# Patient Record
Sex: Female | Born: 1965
Health system: Southern US, Community
[De-identification: ages and names within clinical notes are randomized; demographics above are authoritative.]

## PROBLEM LIST (undated history)

## (undated) DIAGNOSIS — F419 Anxiety disorder, unspecified: Secondary | ICD-10-CM

## (undated) DIAGNOSIS — D649 Anemia, unspecified: Secondary | ICD-10-CM

## (undated) DIAGNOSIS — E119 Type 2 diabetes mellitus without complications: Secondary | ICD-10-CM

## (undated) DIAGNOSIS — E785 Hyperlipidemia, unspecified: Secondary | ICD-10-CM

## (undated) DIAGNOSIS — I10 Essential (primary) hypertension: Secondary | ICD-10-CM

## (undated) DIAGNOSIS — I82409 Acute embolism and thrombosis of unspecified deep veins of unspecified lower extremity: Secondary | ICD-10-CM

## (undated) DIAGNOSIS — E559 Vitamin D deficiency, unspecified: Secondary | ICD-10-CM

## (undated) HISTORY — DX: Type 2 diabetes mellitus without complications: E11.9

## (undated) HISTORY — DX: Anxiety disorder, unspecified: F41.9

## (undated) HISTORY — DX: Vitamin D deficiency, unspecified: E55.9

## (undated) HISTORY — PX: TUBAL LIGATION: SHX77

## (undated) HISTORY — PX: COLONOSCOPY: SHX174

## (undated) HISTORY — DX: Anemia, unspecified: D64.9

## (undated) HISTORY — DX: Hyperlipidemia, unspecified: E78.5

## (undated) HISTORY — DX: Essential (primary) hypertension: I10

---

## 1997-07-08 ENCOUNTER — Inpatient Hospital Stay (HOSPITAL_COMMUNITY): Admission: AD | Admit: 1997-07-08 | Discharge: 1997-07-08 | Payer: Self-pay | Admitting: *Deleted

## 1999-07-07 ENCOUNTER — Emergency Department (HOSPITAL_COMMUNITY): Admission: EM | Admit: 1999-07-07 | Discharge: 1999-07-07 | Payer: Self-pay | Admitting: Emergency Medicine

## 1999-07-08 ENCOUNTER — Emergency Department (HOSPITAL_COMMUNITY): Admission: EM | Admit: 1999-07-08 | Discharge: 1999-07-08 | Payer: Self-pay | Admitting: Emergency Medicine

## 2000-02-21 ENCOUNTER — Emergency Department (HOSPITAL_COMMUNITY): Admission: EM | Admit: 2000-02-21 | Discharge: 2000-02-21 | Payer: Self-pay | Admitting: Emergency Medicine

## 2000-04-01 ENCOUNTER — Emergency Department (HOSPITAL_COMMUNITY): Admission: EM | Admit: 2000-04-01 | Discharge: 2000-04-01 | Payer: Self-pay | Admitting: Emergency Medicine

## 2002-04-13 ENCOUNTER — Emergency Department (HOSPITAL_COMMUNITY): Admission: EM | Admit: 2002-04-13 | Discharge: 2002-04-14 | Payer: Self-pay | Admitting: Emergency Medicine

## 2004-08-03 ENCOUNTER — Encounter: Admission: RE | Admit: 2004-08-03 | Discharge: 2004-08-03 | Payer: Self-pay | Admitting: Internal Medicine

## 2004-12-30 ENCOUNTER — Emergency Department (HOSPITAL_COMMUNITY): Admission: EM | Admit: 2004-12-30 | Discharge: 2004-12-30 | Payer: Self-pay | Admitting: Emergency Medicine

## 2005-02-13 ENCOUNTER — Emergency Department (HOSPITAL_COMMUNITY): Admission: EM | Admit: 2005-02-13 | Discharge: 2005-02-13 | Payer: Self-pay | Admitting: Emergency Medicine

## 2005-05-11 ENCOUNTER — Emergency Department (HOSPITAL_COMMUNITY): Admission: EM | Admit: 2005-05-11 | Discharge: 2005-05-11 | Payer: Self-pay | Admitting: Emergency Medicine

## 2006-05-03 ENCOUNTER — Emergency Department (HOSPITAL_COMMUNITY): Admission: EM | Admit: 2006-05-03 | Discharge: 2006-05-03 | Payer: Self-pay | Admitting: Family Medicine

## 2006-09-29 ENCOUNTER — Emergency Department (HOSPITAL_COMMUNITY): Admission: EM | Admit: 2006-09-29 | Discharge: 2006-09-29 | Payer: Self-pay | Admitting: Family Medicine

## 2007-03-01 ENCOUNTER — Emergency Department (HOSPITAL_COMMUNITY): Admission: EM | Admit: 2007-03-01 | Discharge: 2007-03-01 | Payer: Self-pay | Admitting: Family Medicine

## 2007-11-05 ENCOUNTER — Emergency Department (HOSPITAL_COMMUNITY): Admission: EM | Admit: 2007-11-05 | Discharge: 2007-11-05 | Payer: Self-pay | Admitting: Family Medicine

## 2008-02-12 ENCOUNTER — Emergency Department (HOSPITAL_COMMUNITY): Admission: EM | Admit: 2008-02-12 | Discharge: 2008-02-12 | Payer: Self-pay | Admitting: Emergency Medicine

## 2008-08-31 ENCOUNTER — Emergency Department (HOSPITAL_COMMUNITY): Admission: EM | Admit: 2008-08-31 | Discharge: 2008-08-31 | Payer: Self-pay | Admitting: Family Medicine

## 2008-12-06 ENCOUNTER — Emergency Department (HOSPITAL_COMMUNITY): Admission: EM | Admit: 2008-12-06 | Discharge: 2008-12-06 | Payer: Self-pay | Admitting: Emergency Medicine

## 2009-02-17 ENCOUNTER — Emergency Department (HOSPITAL_COMMUNITY): Admission: EM | Admit: 2009-02-17 | Discharge: 2009-02-17 | Payer: Self-pay | Admitting: Emergency Medicine

## 2009-08-06 ENCOUNTER — Emergency Department (HOSPITAL_COMMUNITY): Admission: EM | Admit: 2009-08-06 | Discharge: 2009-08-06 | Payer: Self-pay | Admitting: Family Medicine

## 2009-08-08 ENCOUNTER — Emergency Department (HOSPITAL_COMMUNITY): Admission: EM | Admit: 2009-08-08 | Discharge: 2009-08-08 | Payer: Self-pay | Admitting: Family Medicine

## 2009-08-18 ENCOUNTER — Emergency Department (HOSPITAL_COMMUNITY): Admission: EM | Admit: 2009-08-18 | Discharge: 2009-08-18 | Payer: Self-pay | Admitting: Family Medicine

## 2009-10-08 ENCOUNTER — Emergency Department (HOSPITAL_COMMUNITY): Admission: EM | Admit: 2009-10-08 | Discharge: 2009-10-08 | Payer: Self-pay | Admitting: Family Medicine

## 2010-01-18 ENCOUNTER — Emergency Department (HOSPITAL_COMMUNITY): Admission: EM | Admit: 2010-01-18 | Discharge: 2010-01-18 | Payer: Self-pay | Admitting: Family Medicine

## 2010-04-10 ENCOUNTER — Emergency Department (HOSPITAL_COMMUNITY)
Admission: EM | Admit: 2010-04-10 | Discharge: 2010-04-10 | Payer: Self-pay | Source: Home / Self Care | Admitting: Emergency Medicine

## 2010-04-15 ENCOUNTER — Encounter: Payer: Self-pay | Admitting: Internal Medicine

## 2010-06-11 LAB — CULTURE, ROUTINE-ABSCESS

## 2010-07-02 LAB — POCT PREGNANCY, URINE: Preg Test, Ur: NEGATIVE

## 2010-07-02 LAB — POCT URINALYSIS DIP (DEVICE)
Ketones, ur: NEGATIVE mg/dL
Protein, ur: 30 mg/dL — AB
Urobilinogen, UA: 1 mg/dL (ref 0.0–1.0)

## 2010-07-02 LAB — WET PREP, GENITAL: Yeast Wet Prep HPF POC: NONE SEEN

## 2010-10-25 ENCOUNTER — Other Ambulatory Visit: Payer: Self-pay | Admitting: Internal Medicine

## 2010-10-25 DIAGNOSIS — Z1231 Encounter for screening mammogram for malignant neoplasm of breast: Secondary | ICD-10-CM

## 2010-12-05 ENCOUNTER — Ambulatory Visit: Payer: Self-pay

## 2010-12-21 LAB — GC/CHLAMYDIA PROBE AMP, GENITAL
Chlamydia, DNA Probe: NEGATIVE
GC Probe Amp, Genital: NEGATIVE

## 2010-12-21 LAB — WET PREP, GENITAL: Yeast Wet Prep HPF POC: NONE SEEN

## 2010-12-21 LAB — POCT URINALYSIS DIP (DEVICE)
Bilirubin Urine: NEGATIVE
Glucose, UA: NEGATIVE
Ketones, ur: NEGATIVE
Operator id: 29721
Specific Gravity, Urine: 1.025

## 2010-12-21 LAB — POCT PREGNANCY, URINE: Preg Test, Ur: NEGATIVE

## 2011-07-22 ENCOUNTER — Encounter (HOSPITAL_COMMUNITY): Payer: Self-pay | Admitting: Emergency Medicine

## 2011-07-22 ENCOUNTER — Emergency Department (HOSPITAL_COMMUNITY)
Admission: EM | Admit: 2011-07-22 | Discharge: 2011-07-22 | Discharge status: Against Medical Advice | Payer: Medicaid Other | Attending: Emergency Medicine | Admitting: Emergency Medicine

## 2011-07-22 DIAGNOSIS — R109 Unspecified abdominal pain: Secondary | ICD-10-CM | POA: Insufficient documentation

## 2011-07-22 NOTE — ED Notes (Signed)
No answer

## 2011-07-22 NOTE — ED Notes (Signed)
Pt called X2 unable to locate at this time

## 2011-07-22 NOTE — ED Notes (Signed)
abd pain started yesterday pm nausea no vomiting has had diarrhea started her cycle today has been seeing a d/c she states  Denies dysuria

## 2011-09-26 ENCOUNTER — Emergency Department (HOSPITAL_COMMUNITY): Payer: Medicaid Other

## 2011-09-26 ENCOUNTER — Emergency Department (HOSPITAL_COMMUNITY)
Admission: EM | Admit: 2011-09-26 | Discharge: 2011-09-26 | Disposition: A | Discharge status: Home Health | Payer: Medicaid Other | Attending: Emergency Medicine | Admitting: Emergency Medicine

## 2011-09-26 ENCOUNTER — Encounter (HOSPITAL_COMMUNITY): Payer: Self-pay | Admitting: Emergency Medicine

## 2011-09-26 DIAGNOSIS — M79609 Pain in unspecified limb: Secondary | ICD-10-CM | POA: Insufficient documentation

## 2011-09-26 DIAGNOSIS — F17200 Nicotine dependence, unspecified, uncomplicated: Secondary | ICD-10-CM | POA: Insufficient documentation

## 2011-09-26 DIAGNOSIS — M79672 Pain in left foot: Secondary | ICD-10-CM

## 2011-09-26 MED ORDER — IBUPROFEN 400 MG PO TABS
800.0000 mg | ORAL_TABLET | Freq: Once | ORAL | Status: AC
  Administered 2011-09-26: 800 mg via ORAL
  Filled 2011-09-26: qty 2

## 2011-09-26 MED ORDER — NAPROXEN 500 MG PO TABS: 500.0000 mg | ORAL_TABLET | Freq: Two times a day (BID) | ORAL | Status: DC

## 2011-09-26 NOTE — ED Provider Notes (Signed)
History     CSN: [id number]  Arrival date & time 09/26/11  1924   First MD Initiated Contact with Patient 09/26/11 2006      Chief Complaint  Patient presents with  . Foot Pain   HPI  History provided by the patient. Patient is a 46 year old female with no significant past medical history who presents with complaints of intermittent pains in bilateral lower feet. Patient reports having pain symptoms for 1 year or more. Patient states that she believes pains or do to standing and working on her feet. Pain is usually worse after a long day of work. Pain does improve some with rest and elevation at home. Patient states that she has not taken any medications for her pains. Patient states that she has also tried numerous inserts for her shoes and to help with her arch. This is not helped. Patient denies having pain upon awakening her first getting up to walk after rest. She also has some occasional swelling to the feet area pain is described as a burning and tingling sensation. She denies any other aggravating or alleviating factors. She denies any other associated symptoms.    No past medical history on file.  Past Surgical History  Procedure Date  . Cesarean section     No family history on file.  History  Substance Use Topics  . Smoking status: Current Everyday Smoker  . Smokeless tobacco: Not on file  . Alcohol Use: No    OB History    Grav Para Term Preterm Abortions TAB SAB Ect Mult Living                  Review of Systems  Musculoskeletal:       Foot pain  Skin: Negative for rash.       No erythema  Neurological: Positive for numbness. Negative for weakness.    Allergies  Review of patient's allergies indicates no known allergies.  Home Medications  No current outpatient prescriptions on file.  BP 122/82  Pulse 102  Temp 98.5 F (36.9 C) (Oral)  Resp 18  SpO2 99%  LMP 09/22/2011  Physical Exam  Nursing note and vitals reviewed. Constitutional: She is  oriented to person, place, and time. She appears well-developed and well-nourished. No distress.  HENT:  Head: Normocephalic.  Cardiovascular: Normal rate and regular rhythm.   Pulmonary/Chest: Effort normal and breath sounds normal.  Musculoskeletal: Normal range of motion. She exhibits no edema.       Feet appear equal and normal in appearance bilaterally. No appreciable swelling. Normal dorsal pedal pulses. Normal movement in toes and ankles. Normal sensations. There is tenderness over the distal plantar surface of bilateral feet. No gross deformities. No tenderness or pain along the heel.  Neurological: She is alert and oriented to person, place, and time.  Skin: Skin is warm and dry. No rash noted. No erythema.  Psychiatric: She has a normal mood and affect. Her behavior is normal.    ED Course  Procedures    Dg Foot Complete Left  09/26/2011  *RADIOLOGY REPORT*  Clinical Data: Foot pain.  No injury.  LEFT FOOT - COMPLETE 3+ VIEW  Comparison: None  Findings: No acute bony abnormality.  Specifically, no fracture, subluxation, or dislocation.  Soft tissues are intact.  Normal bone mineralization.  Joint spaces are maintained.  IMPRESSION: Normal study.  Original Report Authenticated By: Cyndie Chime, M.D.   Dg Foot Complete Right  09/26/2011  *RADIOLOGY REPORT*  Clinical Data:  Foot pain.  No injury.  RIGHT FOOT COMPLETE - 3+ VIEW  Comparison: None.  Findings: No acute bony abnormality.  Specifically, no fracture, subluxation, or dislocation.  Soft tissues are intact.  Mild degenerative changes in the great toe IP joint with subchondral cyst in the proximal phalanx.  Joint spaces otherwise maintained. Normal bone mineralization.  IMPRESSION: Mild degenerative changes at the great toe IP joint.  Otherwise unremarkable.  Original Report Authenticated By: Cyndie Chime, M.D.     1. Foot pain, bilateral       MDM  8:10 PM patient seen and evaluated. Patient in no acute  distress.        Angus Seller, [place] 09/26/11 2117

## 2011-09-26 NOTE — ED Notes (Signed)
Foot pain, numbness, tingling, burning x 1 year. Feet are not swollen and pt is ambulating with a steady gait.

## 2011-09-27 ENCOUNTER — Encounter (HOSPITAL_COMMUNITY): Payer: Self-pay

## 2011-09-27 ENCOUNTER — Emergency Department (HOSPITAL_COMMUNITY)
Admission: EM | Admit: 2011-09-27 | Discharge: 2011-09-27 | Disposition: A | Discharge status: Home / Self Care | Payer: Medicaid Other | Source: Home / Self Care | Attending: Emergency Medicine | Admitting: Emergency Medicine

## 2011-09-27 DIAGNOSIS — G5750 Tarsal tunnel syndrome, unspecified lower limb: Secondary | ICD-10-CM

## 2011-09-27 NOTE — ED Notes (Signed)
Seen in ED yesterday, did not get her Rx filled for pain in feet for >1 yr since she could not find her Medicaid card; wants to be tested for DM, as she has a family history

## 2011-09-27 NOTE — ED Provider Notes (Signed)
Medical screening examination/treatment/procedure(s) were performed by non-physician practitioner and as supervising physician I was immediately available for consultation/collaboration.   Benny Lennert, MD 09/27/11 [phone]

## 2011-09-27 NOTE — ED Provider Notes (Signed)
History     CSN: [id number]  Arrival date & time 09/27/11  1813   First MD Initiated Contact with Patient 09/27/11 1822      Chief Complaint  Patient presents with  . Foot Pain    (Consider location/radiation/quality/duration/timing/severity/associated sxs/prior treatment) HPI Comments: Patient reports one year of intermittent, bilateral foot pain described as "burning and tingling" located along the medial aspect of her foot and on the bottom of her feet. States that it is worse after standing for about 2 hours, and with walking. Symptoms improved with elevation, rest, wearing flat shoes. No redness, swelling. She denies any pain with heel strike, especially when getting out of bed in the morning, no recent or remote history of trauma to her foot. She states that she has has tried changing her footwear multiple times, sole inserts without improvement. She does note that her feet do not hurt him when she is about 20 pounds lighter. She's not tried any medications for symptoms. She reports no symptoms of hyperglycemia but is concerned about possibility of diabetes due to strong family history. She was seen in the ER yesterday for bilateral foot pain. X-rays were negative for any acute changes. She was thought to have bilateral foot pain, sent home with Naprosyn, which she's not yet filled.  ROS as noted in HPI. All other ROS negative.   Patient is a 46 y.o. female presenting with lower extremity pain. The history is provided by the patient. No language interpreter was used.  Foot Pain This is a chronic problem. The current episode started more than 1 week ago. The problem occurs constantly. The problem has not changed since onset.The symptoms are aggravated by walking. The symptoms are relieved by lying down and rest. She has tried a warm compress and a cold compress for the symptoms. The treatment provided no relief.    History reviewed. No pertinent past medical history.  Past Surgical  History  Procedure Date  . Cesarean section     Family History  Problem Relation Age of Onset  . Diabetes Neg Hx     History  Substance Use Topics  . Smoking status: Current Everyday Smoker  . Smokeless tobacco: Not on file  . Alcohol Use: No    OB History    Grav Para Term Preterm Abortions TAB SAB Ect Mult Living                  Review of Systems  Allergies  Review of patient's allergies indicates no known allergies.  Home Medications   Current Outpatient Rx  Name Route Sig Dispense Refill  . NAPROXEN 500 MG PO TABS Oral Take 1 tablet (500 mg total) by mouth 2 (two) times daily. 30 tablet 0    BP 131/88  Pulse 103  Temp 98.6 F (37 C) (Oral)  Resp 18  SpO2 100%  LMP 09/17/2011  Physical Exam  Nursing note and vitals reviewed. Constitutional: She is oriented to person, place, and time. She appears well-developed and well-nourished. No distress.  HENT:  Head: Normocephalic and atraumatic.  Eyes: Conjunctivae and EOM are normal.  Neck: Normal range of motion.  Cardiovascular: Normal rate.   Pulmonary/Chest: Effort normal.  Abdominal: She exhibits no distension.  Musculoskeletal: Normal range of motion.       Feet:       Patient reports pain in the area of drawing. Has tenderness along right plantar fascia. DP 2+. She is able to wiggle all toes actively. Sensation to temperature,  light touch and 2-point discrimination intact bilaterally. No tenderness over the midfoot, fifth metatarsal, tarsals bilaterally. Skin intact. ankle exam normal.  Neurological: She is alert and oriented to person, place, and time. Coordination normal.  Skin: Skin is warm and dry.  Psychiatric: She has a normal mood and affect. Her behavior is normal. Judgment and thought content normal.    ED Course  Procedures (including critical care time)  Labs Reviewed  GLUCOSE, CAPILLARY - Abnormal; Notable for the following:    Glucose-Capillary 100 (*)     All other components within  normal limits   Dg Foot Complete Left  09/26/2011  *RADIOLOGY REPORT*  Clinical Data: Foot pain.  No injury.  LEFT FOOT - COMPLETE 3+ VIEW  Comparison: None  Findings: No acute bony abnormality.  Specifically, no fracture, subluxation, or dislocation.  Soft tissues are intact.  Normal bone mineralization.  Joint spaces are maintained.  IMPRESSION: Normal study.  Original Report Authenticated By: Cyndie Chime, M.D.   Dg Foot Complete Right  09/26/2011  *RADIOLOGY REPORT*  Clinical Data: Foot pain.  No injury.  RIGHT FOOT COMPLETE - 3+ VIEW  Comparison: None.  Findings: No acute bony abnormality.  Specifically, no fracture, subluxation, or dislocation.  Soft tissues are intact.  Mild degenerative changes in the great toe IP joint with subchondral cyst in the proximal phalanx.  Joint spaces otherwise maintained. Normal bone mineralization.  IMPRESSION: Mild degenerative changes at the great toe IP joint.  Otherwise unremarkable.  Original Report Authenticated By: Cyndie Chime, M.D.     1. Tarsal tunnel syndrome     MDM  Previous records reviewed. As noted in history of present illness.  [place] narcotic database reviewed. Pt with no narcotic rx in past 6 months.  H&P most suggestive of tarsal tunnel syndrome, rather than plantar fasciitis. Glucose is normal. Will have patient fill the Naprosyn, take it for 2 weeks daily. Advised continued ice, rest, stretching. Provided her with some rehabilitation exercises. We'll refer her to [facility], or [facility] for definitive management.  Luiz Blare, MD 09/27/11 2027

## 2012-01-09 ENCOUNTER — Emergency Department (HOSPITAL_COMMUNITY)
Admission: EM | Admit: 2012-01-09 | Discharge: 2012-01-09 | Disposition: A | Discharge status: Home / Self Care | Payer: Self-pay | Attending: Emergency Medicine | Admitting: Emergency Medicine

## 2012-01-09 ENCOUNTER — Encounter (HOSPITAL_COMMUNITY): Payer: Self-pay | Admitting: Family Medicine

## 2012-01-09 DIAGNOSIS — K029 Dental caries, unspecified: Secondary | ICD-10-CM | POA: Insufficient documentation

## 2012-01-09 DIAGNOSIS — F17200 Nicotine dependence, unspecified, uncomplicated: Secondary | ICD-10-CM | POA: Insufficient documentation

## 2012-01-09 DIAGNOSIS — K0889 Other specified disorders of teeth and supporting structures: Secondary | ICD-10-CM

## 2012-01-09 MED ORDER — OXYCODONE-ACETAMINOPHEN 5-325 MG PO TABS: 2.0000 | ORAL_TABLET | ORAL | Status: DC | PRN

## 2012-01-09 MED ORDER — AMOXICILLIN 500 MG PO CAPS
500.0000 mg | ORAL_CAPSULE | Freq: Once | ORAL | Status: AC
  Administered 2012-01-09: 500 mg via ORAL
  Filled 2012-01-09: qty 1

## 2012-01-09 MED ORDER — AMOXICILLIN 500 MG PO CAPS: 500.0000 mg | ORAL_CAPSULE | Freq: Three times a day (TID) | ORAL | Status: DC

## 2012-01-09 NOTE — ED Notes (Signed)
Pt complaining of right upper dental  pain

## 2012-01-09 NOTE — ED Provider Notes (Signed)
History   This chart was scribed for Amanda Canal, MD by Charolett Bumpers . The patient was seen in room TR08C/TR08C. Patient's care was started at 1639.   CSN: [id number] Arrival date & time 01/09/12  1611  First MD Initiated Contact with Patient 01/09/12 1639      Chief Complaint  Patient presents with  . Dental Pain   The history is provided by the patient. No language interpreter was used.  Amanda Tran is a 46 y.o. female who presents to the Emergency Department complaining of intermittent, moderate right lower dental pain for the past week. She denies taking anything for pain. She denies any fevers. She denies any other associated symptoms. No modifying factors. No other complaints at this time. She is not followed by a dentist.    History reviewed. No pertinent past medical history.  Past Surgical History  Procedure Date  . Cesarean section     Family History  Problem Relation Age of Onset  . Diabetes Neg Hx     History  Substance Use Topics  . Smoking status: Current Every Day Smoker  . Smokeless tobacco: Not on file  . Alcohol Use: No    OB History    Grav Para Term Preterm Abortions TAB SAB Ect Mult Living                  Review of Systems  Constitutional: Negative for fever and chills.  HENT: Positive for dental problem.   Respiratory: Negative for shortness of breath.   Gastrointestinal: Negative for nausea and vomiting.  Neurological: Negative for weakness.  All other systems reviewed and are negative.    Allergies  Review of patient's allergies indicates no known allergies.  Home Medications  No current outpatient prescriptions on file.  BP 119/80  Pulse 92  Temp 98 F (36.7 C)  Resp 18  SpO2 100%  Physical Exam  Nursing note and vitals reviewed. Constitutional: She is oriented to person, place, and time. She appears well-developed and well-nourished. No distress.       NAD  HENT:  Head: Normocephalic and atraumatic.       Caries on the right lower tooth, gingival appears normal.   Eyes: EOM are normal.  Neck: Neck supple. No tracheal deviation present.  Cardiovascular: Normal rate.   Pulmonary/Chest: Effort normal. No respiratory distress.  Musculoskeletal: Normal range of motion.  Neurological: She is alert and oriented to person, place, and time.  Skin: Skin is warm and dry.  Psychiatric: She has a normal mood and affect. Her behavior is normal.    ED Course  Procedures (including critical care time)  DIAGNOSTIC STUDIES: Oxygen Saturation is 100% on room air, normal by my interpretation.    COORDINATION OF CARE:  16:54-Discussed planned course of treatment with the patient including pain medication and f/u with dentist on call, who is agreeable at this time.   No diagnosis found.    MDM  Amanda Tran is a 46 y.o. female here with dental pain, likely from dental caries. She is prescribed amoxicillin and percocet. She will f/u with dentist on call.    This document was completed by the scribe at my direction and I have reviewed its accuracy. I have personally examined the patient and agrees with the above document.   Chaney Malling, MD      Amanda Canal, MD 01/09/12 [phone]

## 2012-03-11 ENCOUNTER — Encounter (HOSPITAL_COMMUNITY): Payer: Self-pay | Admitting: *Deleted

## 2012-03-11 ENCOUNTER — Emergency Department (HOSPITAL_COMMUNITY)
Admission: EM | Admit: 2012-03-11 | Discharge: 2012-03-11 | Disposition: A | Discharge status: Home / Self Care | Payer: Self-pay | Attending: Emergency Medicine | Admitting: Emergency Medicine

## 2012-03-11 DIAGNOSIS — M79609 Pain in unspecified limb: Secondary | ICD-10-CM

## 2012-03-11 DIAGNOSIS — F17200 Nicotine dependence, unspecified, uncomplicated: Secondary | ICD-10-CM | POA: Insufficient documentation

## 2012-03-11 DIAGNOSIS — IMO0001 Reserved for inherently not codable concepts without codable children: Secondary | ICD-10-CM | POA: Insufficient documentation

## 2012-03-11 DIAGNOSIS — I82409 Acute embolism and thrombosis of unspecified deep veins of unspecified lower extremity: Secondary | ICD-10-CM

## 2012-03-11 HISTORY — DX: Acute embolism and thrombosis of unspecified deep veins of unspecified lower extremity: I82.409

## 2012-03-11 LAB — CBC WITH DIFFERENTIAL/PLATELET
Basophils Absolute: 0 10*3/uL (ref 0.0–0.1)
Basophils Relative: 0 % (ref 0–1)
Eosinophils Relative: 1 % (ref 0–5)
HCT: 35.2 % — ABNORMAL LOW (ref 36.0–46.0)
Lymphocytes Relative: 31 % (ref 12–46)
MCHC: 31.5 g/dL (ref 30.0–36.0)
MCV: 75.4 fL — ABNORMAL LOW (ref 78.0–100.0)
Monocytes Absolute: 0.6 10*3/uL (ref 0.1–1.0)
RDW: 17.1 % — ABNORMAL HIGH (ref 11.5–15.5)

## 2012-03-11 LAB — BASIC METABOLIC PANEL
BUN: 9 mg/dL (ref 6–23)
CO2: 21 mEq/L (ref 19–32)
Calcium: 9 mg/dL (ref 8.4–10.5)
Creatinine, Ser: 0.73 mg/dL (ref 0.50–1.10)

## 2012-03-11 MED ORDER — RIVAROXABAN 15 MG PO TABS
15.0000 mg | ORAL_TABLET | Freq: Every day | ORAL | Status: DC
  Administered 2012-03-11: 15 mg via ORAL
  Filled 2012-03-11: qty 1

## 2012-03-11 MED ORDER — HYDROCODONE-ACETAMINOPHEN 5-325 MG PO TABS: 1.0000 | ORAL_TABLET | ORAL | Status: DC | PRN

## 2012-03-11 MED ORDER — IBUPROFEN 800 MG PO TABS
800.0000 mg | ORAL_TABLET | Freq: Once | ORAL | Status: AC
  Administered 2012-03-11: 800 mg via ORAL
  Filled 2012-03-11: qty 1

## 2012-03-11 MED ORDER — RIVAROXABAN 15 MG PO TABS: 15.0000 mg | ORAL_TABLET | Freq: Two times a day (BID) | ORAL | Status: DC

## 2012-03-11 MED ORDER — TRAMADOL HCL 50 MG PO TABS: 50.0000 mg | ORAL_TABLET | Freq: Four times a day (QID) | ORAL | Status: DC | PRN

## 2012-03-11 MED ORDER — SODIUM CHLORIDE 0.9 % IV SOLN
Freq: Once | INTRAVENOUS | Status: AC
  Administered 2012-03-11: 13:00:00 via INTRAVENOUS

## 2012-03-11 MED ORDER — HYDROCODONE-ACETAMINOPHEN 5-325 MG PO TABS
1.0000 | ORAL_TABLET | Freq: Once | ORAL | Status: AC
  Administered 2012-03-11: 1 via ORAL
  Filled 2012-03-11: qty 1

## 2012-03-11 NOTE — ED Provider Notes (Signed)
Patient tells me prior to leaving the emergency department, but she itches when she takes hydrocodone and requests a different pain medicine. I will give her tramadol.  Roxy Horseman, PA-C 03/11/12 1540

## 2012-03-11 NOTE — ED Notes (Signed)
Pt states that she noticed yesterday that she was having severe L thigh pain.  It is unrelieved with heating pad or rest.  Pedal pulse present.  Denies any CP or SOB.

## 2012-03-11 NOTE — Progress Notes (Signed)
*  Preliminary Results* Left lower extremity venous duplex completed. Left lower extremity is positive for deep vein thrombosis involving the left distal femoral vein, popliteal, and proximal posterior tibial and peroneal veins. No evidence of left Baker's cyst. Preliminary results discussed with Melvenia Beam, PA.  03/11/2012 12:25 PM Gertie Fey, RDMS, RDCS

## 2012-03-11 NOTE — ED Provider Notes (Signed)
History     CSN: [id number]  Arrival date & time 03/11/12  1308   First MD Initiated Contact with Patient 03/11/12 1007      Chief Complaint  Patient presents with  . Leg Pain    (Consider location/radiation/quality/duration/timing/severity/associated sxs/prior treatment) Patient is a 46 y.o. female presenting with leg pain. The history is provided by the patient.  Leg Pain  The incident occurred 2 days ago. There was no injury mechanism. The pain is present in the left thigh. The quality of the pain is described as aching. The pain is moderate. The pain has been constant since onset. Pertinent negatives include no numbness, no loss of motion, no loss of sensation and no tingling. Associated symptoms comments: Lower left extremity pain most predominant in the medial thigh. No injury, back pain or similar pain in the past. She denies swelling, redness or fever. The pain is constant but worse with movement and extends to the hip and distally into calf..    No past medical history on file.  Past Surgical History  Procedure Date  . Cesarean section     Family History  Problem Relation Age of Onset  . Diabetes Neg Hx     History  Substance Use Topics  . Smoking status: Current Every Day Smoker  . Smokeless tobacco: Never Used  . Alcohol Use: No    OB History    Grav Para Term Preterm Abortions TAB SAB Ect Mult Living                  Review of Systems  Constitutional: Negative for fever and chills.  HENT: Negative.   Respiratory: Negative.   Cardiovascular: Negative.   Gastrointestinal: Negative.   Genitourinary: Negative for dysuria.  Musculoskeletal: Positive for myalgias.       See HPI.  Skin: Negative.   Neurological: Negative.  Negative for tingling and numbness.    Allergies  Review of patient's allergies indicates no known allergies.  Home Medications  No current outpatient prescriptions on file.  BP 96/63  Pulse 76  Temp 98.4 F (36.9 C) (Oral)   Resp 18  SpO2 100%  LMP 02/26/2012  Physical Exam  Constitutional: She is oriented to person, place, and time. She appears well-developed and well-nourished.  Neck: Normal range of motion.  Cardiovascular:       Pulses in distal LE's present and equal.   Pulmonary/Chest: Effort normal.  Musculoskeletal: Normal range of motion. She exhibits no edema.       Left leg unremarkable in appearance, without swelling or discoloration. No warmth to light touch. Tender to medial thigh without mass or palpable abnormality. Calf and hip are nontender.   Neurological: She is alert and oriented to person, place, and time.  Skin: Skin is warm and dry.    ED Course  Procedures (including critical care time)   Labs Reviewed  CBC WITH DIFFERENTIAL  BASIC METABOLIC PANEL   Results for orders placed during the hospital encounter of 03/11/12  CBC WITH DIFFERENTIAL      Component Value Range   WBC 6.8  4.0 - 10.5 K/uL   RBC 4.67  3.87 - 5.11 MIL/uL   Hemoglobin 11.1 (*) 12.0 - 15.0 g/dL   HCT 65.7 (*) 84.6 - 96.2 %   MCV 75.4 (*) 78.0 - 100.0 fL   MCH 23.8 (*) 26.0 - 34.0 pg   MCHC 31.5  30.0 - 36.0 g/dL   RDW 95.2 (*) 84.1 - 32.4 %  Platelets 244  150 - 400 K/uL   Neutrophils Relative 59  43 - 77 %   Neutro Abs 4.0  1.7 - 7.7 K/uL   Lymphocytes Relative 31  12 - 46 %   Lymphs Abs 2.1  0.7 - 4.0 K/uL   Monocytes Relative 8  3 - 12 %   Monocytes Absolute 0.6  0.1 - 1.0 K/uL   Eosinophils Relative 1  0 - 5 %   Eosinophils Absolute 0.1  0.0 - 0.7 K/uL   Basophils Relative 0  0 - 1 %   Basophils Absolute 0.0  0.0 - 0.1 K/uL  BASIC METABOLIC PANEL      Component Value Range   Sodium 137  135 - 145 mEq/L   Potassium 3.6  3.5 - 5.1 mEq/L   Chloride 104  96 - 112 mEq/L   CO2 21  19 - 32 mEq/L   Glucose, Bld 82  70 - 99 mg/dL   BUN 9  6 - 23 mg/dL   Creatinine, Ser 1.61  0.50 - 1.10 mg/dL   Calcium 9.0  8.4 - 09.6 mg/dL   GFR calc non Af Amer >90  >90 mL/min   GFR calc Af Amer >90  >90  mL/min    No results found.   No diagnosis found.  1. DVT   MDM  Positive for DVT on doppler - distal femoral to proximal PTV and peroneal. Discussed outpatient follow up with the patient and will need to have assistance. [employer] Coordinator involved. Xarelto ordered.  [facility] 2 days. Xarelto explained, all questions answer. Normal blood studies. She is stable for discharge.   Rodena Medin, PA-C 03/11/12 [phone]

## 2012-03-12 NOTE — ED Provider Notes (Signed)
Medical screening examination/treatment/procedure(s) were performed by non-physician practitioner and as supervising physician I was immediately available for consultation/collaboration.  Flint Melter, MD 03/12/12 [phone]

## 2012-03-18 ENCOUNTER — Encounter (HOSPITAL_COMMUNITY): Payer: Self-pay | Admitting: Emergency Medicine

## 2012-03-18 ENCOUNTER — Observation Stay (HOSPITAL_COMMUNITY)
Admission: EM | Admit: 2012-03-18 | Discharge: 2012-03-19 | Disposition: A | Discharge status: Home / Self Care | Payer: Medicaid Other | Attending: Internal Medicine | Admitting: Internal Medicine

## 2012-03-18 DIAGNOSIS — R42 Dizziness and giddiness: Secondary | ICD-10-CM | POA: Insufficient documentation

## 2012-03-18 DIAGNOSIS — N926 Irregular menstruation, unspecified: Secondary | ICD-10-CM

## 2012-03-18 DIAGNOSIS — N939 Abnormal uterine and vaginal bleeding, unspecified: Secondary | ICD-10-CM

## 2012-03-18 DIAGNOSIS — F17200 Nicotine dependence, unspecified, uncomplicated: Secondary | ICD-10-CM

## 2012-03-18 DIAGNOSIS — D649 Anemia, unspecified: Secondary | ICD-10-CM

## 2012-03-18 DIAGNOSIS — R5381 Other malaise: Secondary | ICD-10-CM | POA: Insufficient documentation

## 2012-03-18 DIAGNOSIS — N920 Excessive and frequent menstruation with regular cycle: Secondary | ICD-10-CM | POA: Insufficient documentation

## 2012-03-18 DIAGNOSIS — I82402 Acute embolism and thrombosis of unspecified deep veins of left lower extremity: Secondary | ICD-10-CM | POA: Diagnosis present

## 2012-03-18 DIAGNOSIS — D509 Iron deficiency anemia, unspecified: Secondary | ICD-10-CM | POA: Diagnosis present

## 2012-03-18 DIAGNOSIS — Z7901 Long term (current) use of anticoagulants: Secondary | ICD-10-CM | POA: Insufficient documentation

## 2012-03-18 DIAGNOSIS — D62 Acute posthemorrhagic anemia: Principal | ICD-10-CM | POA: Insufficient documentation

## 2012-03-18 DIAGNOSIS — R51 Headache: Secondary | ICD-10-CM | POA: Insufficient documentation

## 2012-03-18 DIAGNOSIS — A599 Trichomoniasis, unspecified: Secondary | ICD-10-CM | POA: Insufficient documentation

## 2012-03-18 DIAGNOSIS — I82409 Acute embolism and thrombosis of unspecified deep veins of unspecified lower extremity: Secondary | ICD-10-CM | POA: Insufficient documentation

## 2012-03-18 HISTORY — DX: Acute embolism and thrombosis of unspecified deep veins of unspecified lower extremity: I82.409

## 2012-03-18 LAB — WET PREP, GENITAL
WBC, Wet Prep HPF POC: NONE SEEN
Yeast Wet Prep HPF POC: NONE SEEN

## 2012-03-18 LAB — CBC
HCT: 29.4 % — ABNORMAL LOW (ref 36.0–46.0)
MCHC: 32.3 g/dL (ref 30.0–36.0)
Platelets: 252 10*3/uL (ref 150–400)
RDW: 16.8 % — ABNORMAL HIGH (ref 11.5–15.5)
WBC: 5.5 10*3/uL (ref 4.0–10.5)

## 2012-03-18 LAB — RETICULOCYTES
RBC.: 3.97 MIL/uL (ref 3.87–5.11)
Retic Count, Absolute: 27.8 10*3/uL (ref 19.0–186.0)

## 2012-03-18 LAB — CBC WITH DIFFERENTIAL/PLATELET
Basophils Absolute: 0 10*3/uL (ref 0.0–0.1)
Eosinophils Relative: 3 % (ref 0–5)
HCT: 33 % — ABNORMAL LOW (ref 36.0–46.0)
Lymphocytes Relative: 36 % (ref 12–46)
Lymphs Abs: 1.7 10*3/uL (ref 0.7–4.0)
MCV: 75 fL — ABNORMAL LOW (ref 78.0–100.0)
Monocytes Absolute: 0.3 10*3/uL (ref 0.1–1.0)
Neutro Abs: 2.7 10*3/uL (ref 1.7–7.7)
Platelets: 277 10*3/uL (ref 150–400)
RBC: 4.4 MIL/uL (ref 3.87–5.11)
RDW: 16.9 % — ABNORMAL HIGH (ref 11.5–15.5)
WBC: 4.8 10*3/uL (ref 4.0–10.5)

## 2012-03-18 LAB — BASIC METABOLIC PANEL
CO2: 24 mEq/L (ref 19–32)
Calcium: 9.1 mg/dL (ref 8.4–10.5)
Chloride: 103 mEq/L (ref 96–112)
Glucose, Bld: 100 mg/dL — ABNORMAL HIGH (ref 70–99)
Sodium: 137 mEq/L (ref 135–145)

## 2012-03-18 MED ORDER — MORPHINE SULFATE 4 MG/ML IJ SOLN
4.0000 mg | Freq: Once | INTRAMUSCULAR | Status: AC
  Administered 2012-03-18: 4 mg via INTRAVENOUS
  Filled 2012-03-18: qty 1

## 2012-03-18 MED ORDER — ACETAMINOPHEN 650 MG RE SUPP: 650.0000 mg | Freq: Four times a day (QID) | RECTAL | Status: DC | PRN

## 2012-03-18 MED ORDER — RIVAROXABAN 15 MG PO TABS
15.0000 mg | ORAL_TABLET | Freq: Two times a day (BID) | ORAL | Status: DC
  Administered 2012-03-18 – 2012-03-19 (×2): 15 mg via ORAL
  Filled 2012-03-18 (×5): qty 1

## 2012-03-18 MED ORDER — ONDANSETRON HCL 4 MG PO TABS: 4.0000 mg | ORAL_TABLET | Freq: Four times a day (QID) | ORAL | Status: DC | PRN

## 2012-03-18 MED ORDER — SODIUM CHLORIDE 0.9 % IV BOLUS (SEPSIS)
1000.0000 mL | Freq: Once | INTRAVENOUS | Status: AC
  Administered 2012-03-18: 1000 mL via INTRAVENOUS

## 2012-03-18 MED ORDER — ACETAMINOPHEN 325 MG PO TABS
650.0000 mg | ORAL_TABLET | Freq: Four times a day (QID) | ORAL | Status: DC | PRN
  Administered 2012-03-19: 650 mg via ORAL
  Filled 2012-03-18 (×2): qty 2

## 2012-03-18 MED ORDER — METRONIDAZOLE 500 MG PO TABS
2000.0000 mg | ORAL_TABLET | Freq: Once | ORAL | Status: AC
  Administered 2012-03-18: 2000 mg via ORAL
  Filled 2012-03-18: qty 4

## 2012-03-18 MED ORDER — DIPHENHYDRAMINE HCL 25 MG PO CAPS
25.0000 mg | ORAL_CAPSULE | ORAL | Status: DC | PRN
  Administered 2012-03-18: 50 mg via ORAL
  Filled 2012-03-18: qty 2

## 2012-03-18 MED ORDER — SODIUM CHLORIDE 0.9 % IV SOLN: 250.0000 mL | INTRAVENOUS | Status: DC | PRN

## 2012-03-18 MED ORDER — NICOTINE 21 MG/24HR TD PT24
21.0000 mg | MEDICATED_PATCH | Freq: Every day | TRANSDERMAL | Status: DC
  Administered 2012-03-18 – 2012-03-19 (×2): 21 mg via TRANSDERMAL
  Filled 2012-03-18 (×2): qty 1

## 2012-03-18 MED ORDER — DIPHENHYDRAMINE HCL 25 MG PO CAPS: 50.0000 mg | ORAL_CAPSULE | Freq: Four times a day (QID) | ORAL | Status: DC | PRN

## 2012-03-18 MED ORDER — SODIUM CHLORIDE 0.9 % IJ SOLN
3.0000 mL | Freq: Two times a day (BID) | INTRAMUSCULAR | Status: DC
  Administered 2012-03-18: 3 mL via INTRAVENOUS

## 2012-03-18 MED ORDER — SODIUM CHLORIDE 0.9 % IJ SOLN: 3.0000 mL | INTRAMUSCULAR | Status: DC | PRN

## 2012-03-18 MED ORDER — ONDANSETRON HCL 4 MG/2ML IJ SOLN: 4.0000 mg | Freq: Four times a day (QID) | INTRAMUSCULAR | Status: DC | PRN

## 2012-03-18 NOTE — ED Notes (Signed)
Vag bleeding heavy since Monday am  Started to have clots yesterday  , started on meds for DVT  On  12/ 18 no cramping but her head hurts bad  And she is lightheaded

## 2012-03-18 NOTE — ED Provider Notes (Signed)
History     CSN: [id number]  Arrival date & time 03/18/12  1014   First MD Initiated Contact with Patient 03/18/12 1033      No chief complaint on file.   (Consider location/radiation/quality/duration/timing/severity/associated sxs/prior treatment) HPI  46 year old female with hx of DVT currently on Xarelto presents with vaginal bleeding.  Pt report she was diagnosed with DVT and has been started on Xarelto since Dec 18th.  She just started her usual menstrual period yesterday but sts she is having heavy menstruation, having to change pads hourly and having clots.  Onset is gradual, persistent, moderate in severity, nothing makes it better or worse.  She has headache, lightheadedness and feeling weak.  Otherwise denies fever, chills, cp, sob, abd pain, n/v/d, dysuria or rash.   Past Medical History  Diagnosis Date  . DVT (deep venous thrombosis)     Past Surgical History  Procedure Date  . Cesarean section     Family History  Problem Relation Age of Onset  . Diabetes Neg Hx     History  Substance Use Topics  . Smoking status: Current Every Day Smoker  . Smokeless tobacco: Never Used  . Alcohol Use: No    OB History    Grav Para Term Preterm Abortions TAB SAB Ect Mult Living                  Review of Systems  All other systems reviewed and are negative.    Allergies  Review of patient's allergies indicates no known allergies.  Home Medications   Current Outpatient Rx  Name  Route  Sig  Dispense  Refill  . ACETAMINOPHEN 325 MG PO TABS   Oral   Take 650 mg by mouth every 6 (six) hours as needed. For pain         . RIVAROXABAN 15 MG PO TABS   Oral   Take 1 tablet (15 mg total) by mouth 2 (two) times daily.   42 tablet   0     BP 124/82  Pulse 88  Temp 98.2 F (36.8 C)  SpO2 100%  LMP 02/26/2012  Physical Exam  Nursing note and vitals reviewed. Constitutional: She appears well-developed and well-nourished. No distress.  HENT:  Head:  Normocephalic and atraumatic.  Eyes: Conjunctivae normal are normal.  Neck: Normal range of motion. Neck supple.  Cardiovascular: Normal rate and regular rhythm.   Pulmonary/Chest: Effort normal and breath sounds normal. She exhibits no tenderness.  Abdominal: Soft. There is no tenderness.  Genitourinary: Uterus normal. There is no rash or lesion on the right labia. There is no rash or lesion on the left labia. Cervix exhibits no motion tenderness and no discharge. Right adnexum displays no mass and no tenderness. Left adnexum displays no mass and no tenderness. There is bleeding around the vagina. No erythema or tenderness around the vagina. No vaginal discharge found.       Chaperone present:  Moderate amount of blood in vaginal vault.  No significant pain on bimanual exam.  Cervical os closed.    Lymphadenopathy:       Right: No inguinal adenopathy present.       Left: No inguinal adenopathy present.    ED Course  Procedures (including critical care time)  Results for orders placed during the hospital encounter of 03/18/12  CBC WITH DIFFERENTIAL      Component Value Range   WBC 4.8  4.0 - 10.5 K/uL   RBC 4.40  3.87 - 5.11 MIL/uL   Hemoglobin 10.7 (*) 12.0 - 15.0 g/dL   HCT 45.4 (*) 09.8 - 11.9 %   MCV 75.0 (*) 78.0 - 100.0 fL   MCH 24.3 (*) 26.0 - 34.0 pg   MCHC 32.4  30.0 - 36.0 g/dL   RDW 14.7 (*) 82.9 - 56.2 %   Platelets 277  150 - 400 K/uL   Neutrophils Relative 55  43 - 77 %   Neutro Abs 2.7  1.7 - 7.7 K/uL   Lymphocytes Relative 36  12 - 46 %   Lymphs Abs 1.7  0.7 - 4.0 K/uL   Monocytes Relative 5  3 - 12 %   Monocytes Absolute 0.3  0.1 - 1.0 K/uL   Eosinophils Relative 3  0 - 5 %   Eosinophils Absolute 0.1  0.0 - 0.7 K/uL   Basophils Relative 1  0 - 1 %   Basophils Absolute 0.0  0.0 - 0.1 K/uL  BASIC METABOLIC PANEL      Component Value Range   Sodium 137  135 - 145 mEq/L   Potassium 3.6  3.5 - 5.1 mEq/L   Chloride 103  96 - 112 mEq/L   CO2 24  19 - 32 mEq/L    Glucose, Bld 100 (*) 70 - 99 mg/dL   BUN 8  6 - 23 mg/dL   Creatinine, Ser 1.30  0.50 - 1.10 mg/dL   Calcium 9.1  8.4 - 86.5 mg/dL   GFR calc non Af Amer >90  >90 mL/min   GFR calc Af Amer >90  >90 mL/min  WET PREP, GENITAL      Component Value Range   Yeast Wet Prep HPF POC NONE SEEN  NONE SEEN   Trich, Wet Prep FEW (*) NONE SEEN   Clue Cells Wet Prep HPF POC NONE SEEN  NONE SEEN   WBC, Wet Prep HPF POC NONE SEEN  NONE SEEN  TYPE AND SCREEN      Component Value Range   ABO/RH(D) O POS     Antibody Screen NEG     Sample Expiration 03/21/2012     No results found.   1. Heavy menstruation, on Xarelto 2. trichomonas  MDM  Pt with heavy menstruation currently on Xarelto.  She's on her second day of her usual 5 day menstruating.  She reports persistent bleeding, affecting her ADL.  Will obtain labs, pelvic exam, and will check orthostatic VS.  Care discussed with my attending.    1:08 PM Pelvic exam with moderate blood in vaginal vault but otherwise unremarkable.  Pt became tachycardic when she was evaluated for orthostatic hypotension, and is symptomatic.  Hgb is 10.7, was 11.1 last week.  There is some trichomonas on wet prep, will treat.  Consider admission for observation with serial hemoglobin or possibly reverse Xarelto if indicated.  Pt voice understanding and agrees with plan.    2:19 PM I have consulted with [facility], Dr Cena Benton, who agrees to see pt in ER and will admit pt for observation.    BP 134/88  Pulse 93  Temp 98.2 F (36.8 C)  SpO2 100%  LMP 02/26/2012  I have reviewed nursing notes and vital signs. I personally reviewed the imaging tests through PACS system  I reviewed available ER/hospitalization records thought the EMR      Fayrene Helper, PA-C 03/18/12 1420

## 2012-03-18 NOTE — H&P (Signed)
[facility] History and Physical  [name]:[id number] DOB: 05/14/1965 DOA: 03/18/2012  Referring physician: Jeanene Erb by Fayrene Helper, ED PA PCP: Dorrene German, MD  Specialists: I have discussed and consulted OBGYN  Chief Complaint: heavy menstrual cycles worse on xarelto  HPI: Amanda Tran is a 46 y.o. female  Patient is G5P5 african american patient that reports that she was recently diagnosed with DVT earlier this month and placed on xarelto.  She reports that prior to her being on xarelto she has history of heavy menstrual periods.  Reports that she has been having heavier menstrual periods since being on xarelto and as such given her increased in frequency of changing pads and clots she decided to come to the ED for further evaluation and recommendations.  The problem started gradually and has been persistent.  Nothing patient is aware of makes it better or worse.  She denies any recent sexual intercourse or vaginal discharge.  Denies any abdominal discomfort and reports smoking 1ppd.   In ED patient had hemoglobin of 10.7 decreased from 11.1 on 12/18.  We were consulted for admission 2ary to heavy menstrual cycle and possibility of worsening anemia.  Patient reports that her family is [place] witness and should she need blood transfusion she would like to have a few questions answered.  Review of Systems: The patient denies anorexia, fever, weight loss,, vision loss, decreased hearing, hoarseness, chest pain, syncope, dyspnea on exertion, peripheral edema, balance deficits, hemoptysis, abdominal pain, melena, hematochezia, severe indigestion/heartburn, hematuria, incontinence, genital sores, muscle weakness, suspicious skin lesions, transient blindness, difficulty walking, depression, unusual weight change, + abnormal bleeding, enlarged lymph nodes, angioedema, and breast masses.    Past Medical History  Diagnosis Date  . DVT (deep venous thrombosis)     Past Surgical History  Procedure Date  . Cesarean section    Social History:  reports that she has been smoking.  She has never used smokeless tobacco. She reports that she does not drink alcohol or use illicit drugs. Lives at home  Can patient participate in ADLs? Yes Reportedly smokes 1ppd  No Known Allergies  Family History  Problem Relation Age of Onset  . Diabetes Neg Hx    None other reported when asked directly   Prior to Admission medications   Medication Sig Start Date End Date Taking? Authorizing Provider  acetaminophen (TYLENOL) 325 MG tablet Take 650 mg by mouth every 6 (six) hours as needed. For pain   Yes Historical Provider, MD  Rivaroxaban (XARELTO) 15 MG TABS tablet Take 1 tablet (15 mg total) by mouth 2 (two) times daily. 03/11/12  Yes Rodena Medin, PA-C   Physical Exam: Filed Vitals:   03/18/12 1032 03/18/12 1215 03/18/12 1216  BP: 124/82 131/78 134/88  Pulse: 88 74 93  Temp: 98.2 F (36.8 C)    SpO2: 100%       General:  Pt in NAD, Alert and Oriented  Eyes: EOMI, non icteric  ENT: normal exterior and appearance   Neck: supple no goiter  Cardiovascular: RRR, No MRG  Respiratory: CTA BL, no wheezes  Abdomen: soft, NT, ND  Skin: warm and dry  Musculoskeletal: no cyanosis or clubbing  Psychiatric: mood and affect appropriate  Neurologic: answers questions appropriately and moves all extremities, no facial asymmetry  Labs on Admission:  Basic Metabolic Panel:  Lab 03/18/12 8119  NA 137  K 3.6  CL 103  CO2 24  GLUCOSE 100*  BUN 8  CREATININE 0.76  CALCIUM  9.1  MG --  PHOS --   Liver Function Tests: No results found for this basename: AST:5,ALT:5,ALKPHOS:5,BILITOT:5,PROT:5,ALBUMIN:5 in the last 168 hours No results found for this basename: LIPASE:5,AMYLASE:5 in the last 168 hours No results found for this basename: AMMONIA:5 in the last 168 hours CBC:  Lab 03/18/12 1100  WBC 4.8  NEUTROABS 2.7  HGB 10.7*  HCT 33.0*   MCV 75.0*  PLT 277   Cardiac Enzymes: No results found for this basename: CKTOTAL:5,CKMB:5,CKMBINDEX:5,TROPONINI:5 in the last 168 hours  BNP (last 3 results) No results found for this basename: PROBNP:3 in the last 8760 hours CBG: No results found for this basename: GLUCAP:5 in the last 168 hours  Radiological Exams on Admission: No results found.   Assessment/Plan Active Problems:  Anemia Menorrhagia Left leg DVT Nicotine dependence   1. Anemia - Will admit patient to observation and monitor for further decline in h/h given history of recent heavy menstrual cycle while on xarelto - Patient's family is [place] Witness and as such she would like discussion prior to her obtaining blood products should she need them - Also obtain anemia panel and pending results will consider iron supplementation - cbc next am  2. Menorrhagia - Most likely leading up to # 1  - Have consulted OBGYN for further evaluation and recommendations  3. Left leg DVT - Continue xeralto at this juncture - monitor h/h  4. Nicotine dependence - Place patient on nicotine patch. - I have recommended cessation   Code Status: Full code Family Communication: Spoke with patient at bedside Disposition Plan: Likely d/c in 1-2 days if hemoglobin stable  Time spent: > 55 minutes  Penny Pia [facility] Pager [phone]  If 7PM-7AM, please contact night-coverage [email or web address] Password [facility] 03/18/2012, 2:43 PM

## 2012-03-19 ENCOUNTER — Observation Stay (HOSPITAL_COMMUNITY): Payer: Self-pay

## 2012-03-19 LAB — CBC
Hemoglobin: 9.1 g/dL — ABNORMAL LOW (ref 12.0–15.0)
MCH: 24.6 pg — ABNORMAL LOW (ref 26.0–34.0)
Platelets: 261 10*3/uL (ref 150–400)
RBC: 3.7 MIL/uL — ABNORMAL LOW (ref 3.87–5.11)
WBC: 4.1 10*3/uL (ref 4.0–10.5)

## 2012-03-19 LAB — GC/CHLAMYDIA PROBE AMP
CT Probe RNA: NEGATIVE
GC Probe RNA: NEGATIVE

## 2012-03-19 LAB — FERRITIN: Ferritin: 9 ng/mL — ABNORMAL LOW (ref 10–291)

## 2012-03-19 LAB — IRON AND TIBC: Iron: 18 ug/dL — ABNORMAL LOW (ref 42–135)

## 2012-03-19 MED ORDER — MEGESTROL ACETATE 40 MG PO TABS: 40.0000 mg | ORAL_TABLET | Freq: Every day | ORAL | Status: DC

## 2012-03-19 MED ORDER — HYDROCODONE-ACETAMINOPHEN 10-325 MG PO TABS
1.0000 | ORAL_TABLET | Freq: Once | ORAL | Status: DC
  Filled 2012-03-19: qty 1

## 2012-03-19 MED ORDER — FERROUS SULFATE 325 (65 FE) MG PO TABS: 325.0000 mg | ORAL_TABLET | Freq: Two times a day (BID) | ORAL | Status: DC

## 2012-03-19 MED ORDER — ACETAMINOPHEN 325 MG PO TABS
650.0000 mg | ORAL_TABLET | Freq: Once | ORAL | Status: AC
  Administered 2012-03-19: 650 mg via ORAL

## 2012-03-19 MED ORDER — FERROUS SULFATE 325 (65 FE) MG PO TABS
325.0000 mg | ORAL_TABLET | Freq: Two times a day (BID) | ORAL | Status: DC
  Administered 2012-03-19: 325 mg via ORAL
  Filled 2012-03-19 (×3): qty 1

## 2012-03-19 MED ORDER — RIVAROXABAN 15 MG PO TABS: 15.0000 mg | ORAL_TABLET | Freq: Two times a day (BID) | ORAL | Status: DC

## 2012-03-19 MED ORDER — RIVAROXABAN 20 MG PO TABS: 20.0000 mg | ORAL_TABLET | Freq: Every day | ORAL | Status: DC

## 2012-03-19 MED ORDER — MEGESTROL ACETATE 40 MG PO TABS
40.0000 mg | ORAL_TABLET | Freq: Every day | ORAL | Status: DC
  Filled 2012-03-19: qty 1

## 2012-03-19 NOTE — ED Provider Notes (Signed)
Medical screening examination/treatment/procedure(s) were performed by non-physician practitioner and as supervising physician I was immediately available for consultation/collaboration.  Doug Sou, MD 03/19/12 0120

## 2012-03-19 NOTE — Care Management Note (Signed)
    Page 1 of 1   03/19/2012     4:08:29 PM   CARE MANAGEMENT NOTE 03/19/2012  Patient:  Amanda Tran, Amanda Tran   Account Number:  [id number]  Date Initiated:  03/19/2012  Documentation initiated by:  Letha Cape  Subjective/Objective Assessment:   dx anemia  admit as observation- children lives with patient. pta independent.     Action/Plan:   Anticipated DC Date:  03/19/2012   Anticipated DC Plan:  HOME/SELF CARE      DC Planning Services  CM consult      Choice offered to / List presented to:             Status of service:  Completed, signed off Medicare Important Message given?   (If response is "NO", the following Medicare IM given date fields will be blank) Date Medicare IM given:   Date Additional Medicare IM given:    Discharge Disposition:  HOME/SELF CARE  Per UR Regulation:  Reviewed for med. necessity/level of care/duration of stay  If discussed at Long Length of Stay Meetings, dates discussed:    Comments:  03/19/12 16:04 Letha Cape RN, BSN [phone] patient lives with family, pta independent.  Patient was recently admitted on 12/17 , she recieved Xarelto through [facility] at that time, in which she still has some of the medication left.  I assisted her in filling out the patient ast program form for xarelto, gave her the script for xarelto that was sent with fax to see if she can qualify for the program.  Informed patient if she does not hear anything from them by Monday to give them a call first thing Monday morning.  Patient is for dc today, she has transportation and she is familiar with the $4 program at [place], ( she has just used [employer] so not eligible for that).

## 2012-03-19 NOTE — Progress Notes (Signed)
NURSING PROGRESS NOTE  Amanda Tran [id number] Discharge Data: 03/19/2012 4:12 PM Attending Provider: Maretta Bees, MD WUJ:[id number],WGNFA A, MD     [name] to be D/C'd Home per MD order.  Discussed with the patient the After Visit Summary and all questions fully answered. All IV's discontinued with no bleeding noted. All belongings returned to patient for patient to take home.   Last Vital Signs:  Blood pressure 109/63, pulse 75, temperature 98.6 F (37 C), temperature source Oral, resp. rate 20, height 5\' 1"  (1.549 m), weight 65.1 kg (143 lb 8.3 oz), last menstrual period 02/26/2012, SpO2 100.00%.  Discharge Medication List   Medication List     As of 03/19/2012  4:12 PM    TAKE these medications         acetaminophen 325 MG tablet   Commonly known as: TYLENOL   Take 650 mg by mouth every 6 (six) hours as needed. For pain      ferrous sulfate 325 (65 FE) MG tablet   Take 1 tablet (325 mg total) by mouth 2 (two) times daily with a meal.      megestrol 40 MG tablet   Commonly known as: MEGACE   Take 1 tablet (40 mg total) by mouth daily.      Rivaroxaban 15 MG Tabs tablet   Commonly known as: XARELTO   Take 1 tablet (15 mg total) by mouth 2 (two) times daily. Take twice a day for 21 days from 03/12/12.      Rivaroxaban 20 MG Tabs   Commonly known as: XARELTO   Take 1 tablet (20 mg total) by mouth daily. Take 20 mg daily after finishing twice daily for 21 days.        Nashton Belson, Elmarie Mainland, RN

## 2012-03-19 NOTE — Discharge Summary (Signed)
PATIENT DETAILS Name: Amanda Tran Age: 46 y.o. Sex: female Date of Birth: 1965/04/04 MRN: [id number]. Admit Date: 03/18/2012 Admitting Physician: Penny Pia, MD AOZ:[id number],IONGE A, MD  Recommendations for Outpatient Follow-up:  1. Will need frequent CBC while on the Xarelto  PRIMARY DISCHARGE DIAGNOSIS:  Active Problems:  Anemia  Left leg DVT  Menorrhagia  Nicotine dependence      PAST MEDICAL HISTORY: Past Medical History  Diagnosis Date  . DVT (deep venous thrombosis)     DISCHARGE MEDICATIONS:   Medication List     As of Mar 24, 2012  1:35 PM    TAKE these medications         acetaminophen 325 MG tablet   Commonly known as: TYLENOL   Take 650 mg by mouth every 6 (six) hours as needed. For pain      ferrous sulfate 325 (65 FE) MG tablet   Take 1 tablet (325 mg total) by mouth 2 (two) times daily with a meal.      megestrol 40 MG tablet   Commonly known as: MEGACE   Take 1 tablet (40 mg total) by mouth daily.      Rivaroxaban 15 MG Tabs tablet   Commonly known as: XARELTO   Take 1 tablet (15 mg total) by mouth 2 (two) times daily. Take twice a day for 21 days from 03/11/12. After 21 days-dose is 20 mg once a day         BRIEF HPI:  See H&P, Labs, Consult and Test reports for all details in brief, patient was admitted for heavy menorrhagia since starting the Xarelto. For further details please see the history and physical dictated on admission.  CONSULTATIONS:   Phone consultation with Dr. Barron Schmid- GYN on call  PERTINENT RADIOLOGIC STUDIES: US Transvaginal Non-ob  24-Mar-2012  *RADIOLOGY REPORT*  Clinical Data: Menorrhagia.  LMP 03/15/2012.  G5 P5.  TRANSABDOMINAL AND TRANSVAGINAL ULTRASOUND OF PELVIS Technique:  Both transabdominal and transvaginal ultrasound examinations of the pelvis were performed. Transabdominal technique was performed for global imaging of the pelvis including uterus, ovaries, adnexal regions, and pelvic cul-de-sac.   It was necessary to proceed with endovaginal exam following the transabdominal exam to visualize the endometrium.  Comparison:  None  Findings:  Uterus: The uterus is retroflexed and measures 6.7 x 5.0 x 5.1 cm. The the myometrium is heterogeneous with scattered punctate areas of increased echogenicity. No focal uterine mass is identified.  Endometrium: Measures 9 mm in thickness.  Right ovary:  Right ovary measures 4.0 x 2.9 x 2.8 cm.  No cyst or mass is identified.  Left ovary: Measures 2.9 x 2.2 x 2.0 cm and has normal appearances.  Other findings: There is a trace amount of free pelvic fluid.  IMPRESSION: 1.  Possible uterine adenomyosis. 2.  Normal endometrial thickness. 3.  Right ovary is mildly prominent in size, but otherwise unremarkable.                                  4.  Normal left ovary.   Original Report Authenticated By: Britta Mccreedy, M.D.    US Pelvis Complete  03-24-12  *RADIOLOGY REPORT*  Clinical Data: Menorrhagia.  LMP 03/15/2012.  G5 P5.  TRANSABDOMINAL AND TRANSVAGINAL ULTRASOUND OF PELVIS Technique:  Both transabdominal and transvaginal ultrasound examinations of the pelvis were performed. Transabdominal technique was performed for global imaging of the pelvis including uterus, ovaries, adnexal regions, and pelvic cul-de-sac.  It was necessary to proceed with endovaginal exam following the transabdominal exam to visualize the endometrium.  Comparison:  None  Findings:  Uterus: The uterus is retroflexed and measures 6.7 x 5.0 x 5.1 cm. The the myometrium is heterogeneous with scattered punctate areas of increased echogenicity. No focal uterine mass is identified.  Endometrium: Measures 9 mm in thickness.  Right ovary:  Right ovary measures 4.0 x 2.9 x 2.8 cm.  No cyst or mass is identified.  Left ovary: Measures 2.9 x 2.2 x 2.0 cm and has normal appearances.  Other findings: There is a trace amount of free pelvic fluid.  IMPRESSION: 1.  Possible uterine adenomyosis. 2.  Normal  endometrial thickness. 3.  Right ovary is mildly prominent in size, but otherwise unremarkable.                                  4.  Normal left ovary.   Original Report Authenticated By: Britta Mccreedy, M.D.      PERTINENT LAB RESULTS: CBC:  Basename 03/19/12 0620 03/18/12 1804  WBC 4.1 5.5  HGB 9.1* 9.5*  HCT 27.9* 29.4*  PLT 261 252   CMET CMP     Component Value Date/Time   NA 137 03/18/2012 1100   K 3.6 03/18/2012 1100   CL 103 03/18/2012 1100   CO2 24 03/18/2012 1100   GLUCOSE 100* 03/18/2012 1100   BUN 8 03/18/2012 1100   CREATININE 0.76 03/18/2012 1100   CALCIUM 9.1 03/18/2012 1100   GFRNONAA >90 03/18/2012 1100   GFRAA >90 03/18/2012 1100    GFR Estimated Creatinine Clearance: 77.5 ml/min (by C-G formula based on Cr of 0.76). No results found for this basename: LIPASE:2,AMYLASE:2 in the last 72 hours No results found for this basename: CKTOTAL:3,CKMB:3,CKMBINDEX:3,TROPONINI:3 in the last 72 hours No components found with this basename: POCBNP:3 No results found for this basename: DDIMER:2 in the last 72 hours No results found for this basename: HGBA1C:2 in the last 72 hours No results found for this basename: CHOL:2,HDL:2,LDLCALC:2,TRIG:2,CHOLHDL:2,LDLDIRECT:2 in the last 72 hours No results found for this basename: TSH,T4TOTAL,FREET3,T3FREE,THYROIDAB in the last 72 hours  Basename 03/18/12 1804  VITAMINB12 437  FOLATE 9.1  FERRITIN 9*  TIBC 310  IRON 18*  RETICCTPCT 0.7   Coags: No results found for this basename: PT:2,INR:2 in the last 72 hours Microbiology: Recent Results (from the past 240 hour(s))  WET PREP, GENITAL     Status: Abnormal   Collection Time   03/18/12 11:46 AM      Component Value Range Status Comment   Yeast Wet Prep HPF POC NONE SEEN  NONE SEEN Final    Trich, Wet Prep FEW (*) NONE SEEN Final    Clue Cells Wet Prep HPF POC NONE SEEN  NONE SEEN Final    WBC, Wet Prep HPF POC NONE SEEN  NONE SEEN Final      BRIEF HOSPITAL COURSE:    Active Problems: Heavy menstrual bleeding - Patient was just placed on results of approximately week ago so, she then started having heavy menstrual bleeding last Monday. On Monday and Tuesday she approximately change her pads 20 times. On Wednesday she changes her pad around 3-4 times, today by the time of this dictation she has changed her pad only twice. Her breathing seems to have slowed down. - Her hemoglobin has gone down from 11 to 9.1. - I've explained to the patient, that while  on anticoagulation it is expected that she may have much more heavier bleeding than usual. Fortunately her bleeding seems to have slowed down. A pelvic and transvaginal ultrasound done today shows possible adenomyosis. Case was discussed twice with Dr. Dolan Amen, GYN on call twice on the phone, she suggested starting Megace. She suggested that this patient needs a endometrial biopsy, but that could be done in the outpatient setting. She claimed to me that he GYN clinic at [facility], when: The patient either today or tomorrow for a follow up appointment early next week. She recommended the patient be discharged to follow up with the GYN clinic. - Of this was explained to the patient in detail, and she is agreeable with this plan   Anemia - Secondary to acute blood loss - We'll place on iron - I've explained to the patient, that she did have approximately a 2 g drop in hemoglobin, and that is to be somewhat expected when she has her menstrual cycle when she is on anticoagulation. She does not qualify for a IVC filter. -We will just need to manage the next 2-4 months with supportive care, close monitoring and possible PRBC transfusion in case her hemoglobin drops further. - She claims understanding   Left leg DVT - continues with a Xarelto. This was diagnosed in the ED on 12/18   Nicotine dependence - Have counseled extensively.   TODAY-DAY OF DISCHARGE:  Subjective:   [name] today has  no headache,no chest abdominal pain,no new weakness tingling or numbness, feels much better wants to go home today.  Objective:   Blood pressure 92/58, pulse 82, temperature 97.3 F (36.3 C), temperature source Oral, resp. rate 20, height 5\' 1"  (1.549 m), weight 65.1 kg (143 lb 8.3 oz), last menstrual period 02/26/2012, SpO2 99.00%. No intake or output data in the 24 hours ending 03/19/12 1335  Exam Awake Alert, Oriented *3, No new F.N deficits, Normal affect [place].AT,PERRAL Supple Neck,No JVD, No cervical lymphadenopathy appriciated.  Symmetrical Chest wall movement, Good air movement bilaterally, CTAB RRR,No Gallops,Rubs or new Murmurs, No Parasternal Heave +ve B.Sounds, Abd Soft, Non tender, No organomegaly appriciated, No rebound -guarding or rigidity. No Cyanosis, Clubbing or edema, No new Rash or bruise  DISCHARGE CONDITION: Stable  DISPOSITION: HOME  DISCHARGE INSTRUCTIONS:    Activity:  As tolerated   Diet recommendation: Regular Diet      Follow-up Information    Follow up with [facility]. On 03/26/2012. (12:00, co pay is $20, bring meds and id)    Contact information:   [address] [place] [address]       Follow up with [facility]. (Clinic will call you with a follow up appointment. If in case-they dont-please call for appt in 1-2 days.)    Contact information:   tel no [phone]-clinic at the ground floor of the [facility].  (DR [facility], CAROLYN- was the CONSULTING MD)        Total Time spent on discharge equals 45 minutes.  SignedJeoffrey Massed 03/19/2012 1:35 PM

## 2012-03-26 ENCOUNTER — Emergency Department (HOSPITAL_COMMUNITY)
Admission: EM | Admit: 2012-03-26 | Discharge: 2012-03-26 | Disposition: A | Discharge status: Home / Self Care | Payer: Self-pay | Source: Home / Self Care

## 2012-03-26 ENCOUNTER — Encounter (HOSPITAL_COMMUNITY): Payer: Self-pay

## 2012-03-26 DIAGNOSIS — N920 Excessive and frequent menstruation with regular cycle: Secondary | ICD-10-CM

## 2012-03-26 DIAGNOSIS — I82402 Acute embolism and thrombosis of unspecified deep veins of left lower extremity: Secondary | ICD-10-CM

## 2012-03-26 DIAGNOSIS — D649 Anemia, unspecified: Secondary | ICD-10-CM

## 2012-03-26 DIAGNOSIS — F17200 Nicotine dependence, unspecified, uncomplicated: Secondary | ICD-10-CM

## 2012-03-26 DIAGNOSIS — I82409 Acute embolism and thrombosis of unspecified deep veins of unspecified lower extremity: Secondary | ICD-10-CM

## 2012-03-26 DIAGNOSIS — K0889 Other specified disorders of teeth and supporting structures: Secondary | ICD-10-CM

## 2012-03-26 LAB — CBC
HCT: 31.3 % — ABNORMAL LOW (ref 36.0–46.0)
Hemoglobin: 9.9 g/dL — ABNORMAL LOW (ref 12.0–15.0)
MCV: 79.2 fL (ref 78.0–100.0)
RBC: 3.95 MIL/uL (ref 3.87–5.11)
WBC: 4.9 10*3/uL (ref 4.0–10.5)

## 2012-03-26 MED ORDER — TRAMADOL HCL 50 MG PO TABS: 50.0000 mg | ORAL_TABLET | Freq: Four times a day (QID) | ORAL | Status: DC | PRN

## 2012-03-26 NOTE — ED Notes (Signed)
Patient states has a DVT in left leg. Was recently seen in the ed for hemmorrhage also complains of ride sided tooth pain

## 2012-03-26 NOTE — ED Provider Notes (Signed)
History     CSN: [id number]  Arrival date & time 03/26/12  1211  CC: hospital follow up    Chief Complaint  Patient presents with  . DVT   HPI Pt here to follow up from recent hospitalization for excessive vaginal bleeding on Xarelto for DVT.  Pt says the bleeding has stopped now.  She is scheduled to see Gynecologist on Jan 15.   Pt is taking megestrol and taking iron supplements and taking Xarelto.  She is due to have her CBC rechecked today.  Her Hg was 9.1 when she was discharged last week from hospital.    Past Medical History  Diagnosis Date  . DVT (deep venous thrombosis)     Past Surgical History  Procedure Date  . Cesarean section     Family History  Problem Relation Age of Onset  . Diabetes Neg Hx     History  Substance Use Topics  . Smoking status: Current Every Day Smoker  . Smokeless tobacco: Never Used  . Alcohol Use: No    OB History    Grav Para Term Preterm Abortions TAB SAB Ect Mult Living                 Review of Systems  Constitutional: Negative.   HENT: Negative.   Eyes: Negative.   Respiratory: Negative.   Cardiovascular: Negative.   Gastrointestinal: Negative.   Musculoskeletal:       Leg pain  Neurological: Negative.   Hematological: Negative.   Psychiatric/Behavioral: Negative.     Allergies  Review of patient's allergies indicates no known allergies.  Home Medications   Current Outpatient Rx  Name  Route  Sig  Dispense  Refill  . ACETAMINOPHEN 325 MG PO TABS   Oral   Take 650 mg by mouth every 6 (six) hours as needed. For pain         . FERROUS SULFATE 325 (65 FE) MG PO TABS   Oral   Take 1 tablet (325 mg total) by mouth 2 (two) times daily with a meal.   60 tablet   0   . MEGESTROL ACETATE 40 MG PO TABS   Oral   Take 1 tablet (40 mg total) by mouth daily.   30 tablet   0   . RIVAROXABAN 15 MG PO TABS   Oral   Take 1 tablet (15 mg total) by mouth 2 (two) times daily. Take twice a day for 21 days from  03/12/12.   28 tablet   0   . RIVAROXABAN 20 MG PO TABS   Oral   Take 1 tablet (20 mg total) by mouth daily. Take 20 mg daily after finishing twice daily for 21 days.   30 tablet   5     BP 130/87  Pulse 82  Temp 98 F (36.7 C) (Oral)  Resp 19  SpO2 100%  LMP 02/26/2012  Physical Exam  Nursing note and vitals reviewed. Constitutional: She is oriented to person, place, and time. She appears well-developed and well-nourished. No distress.  HENT:  Head: Normocephalic and atraumatic.       Dental caries with swollen gums rotten painful tooth noted  Eyes: EOM are normal. Pupils are equal, round, and reactive to light.  Neck: Normal range of motion. Neck supple.  Cardiovascular: Normal rate, regular rhythm and normal heart sounds.   Abdominal: Soft. Bowel sounds are normal.  Musculoskeletal: Normal range of motion. She exhibits edema and tenderness.  Ted hose to LLE  Neurological: She is alert and oriented to person, place, and time.  Skin: Skin is warm and dry.  Psychiatric: She has a normal mood and affect. Her behavior is normal. Judgment and thought content normal.    ED Course  Procedures (including critical care time)   Labs Reviewed  CBC   No results found.  No diagnosis found.   MDM  IMPRESSION  DVT LLE  Chronic anticoagulation with Xarelto  Anemia  Menorrhagia  Dental Caries with acute dental pain  RECOMMENDATIONS / PLAN Referral to Dentist made today Tramadol 50 mg prn for severe dental pain Continue Xarelto 15 mg po bid for full 30 days, then 20 mg daily Check CBC today Follow up with Gynecologist for endometrial biopsy on 1/15 as scheduled Continue TED hose to LLE   FOLLOW UP 2 months  The patient was given clear instructions to go to ER or return to medical center if symptoms don't improve, worsen or new problems develop.  The patient verbalized understanding.  The patient was told to call to get lab results if they haven't heard  anything in the next week.            Cleora Fleet, MD 03/26/12 1310

## 2012-03-31 ENCOUNTER — Telehealth (HOSPITAL_COMMUNITY): Payer: Self-pay

## 2012-03-31 NOTE — Telephone Encounter (Signed)
Message copied by Lestine Mount on Tue Mar 31, 2012  6:13 PM ------      Message from: Cleora Fleet      Created: Mon Mar 30, 2012  7:22 PM       Please notify patient that her hemoglobin level is stable at 9.9.            Rodney Langton, MD, CDE, FAAFP      [facility]      [facility]      [place], [place]

## 2012-04-02 ENCOUNTER — Telehealth (HOSPITAL_COMMUNITY): Payer: Self-pay

## 2012-04-02 NOTE — Telephone Encounter (Signed)
Message copied by Lestine Mount on Thu Apr 02, 2012 10:05 AM ------      Message from: Cleora Fleet      Created: Mon Mar 30, 2012  7:22 PM       Please notify patient that her hemoglobin level is stable at 9.9.            Rodney Langton, MD, CDE, FAAFP      [facility]      [facility]      [place], [place]

## 2012-04-03 MED ORDER — MEGESTROL ACETATE 40 MG PO TABS: 40.0000 mg | ORAL_TABLET | Freq: Every day | ORAL | Status: DC

## 2012-04-03 MED ORDER — RIVAROXABAN 20 MG PO TABS: 20.0000 mg | ORAL_TABLET | Freq: Every day | ORAL | Status: DC

## 2012-04-03 MED ORDER — FERROUS SULFATE 325 (65 FE) MG PO TABS: 325.0000 mg | ORAL_TABLET | Freq: Two times a day (BID) | ORAL | Status: DC

## 2012-04-03 NOTE — Progress Notes (Signed)
Pt called and said that [facility] never received the prescription for Rivaroxaban 20 mg.  He was supposed to be electronically sent.  The MR indicates that was sent but I called the pharmacy and they said they never received the prescription.  2 other prescriptions were sent electronically on March 19, 2012.  I am re prescribing these prescriptions.  I also called the pharmacy and left a verbal order for the medications to be dispensed.  I also called the patient and talked with her about the importance of making sure she gets the prescription filled.  She tells me that she's only been taking 15 mg once daily.  I explained to her that she should be taking 20 mg once daily once she has completed 21 days of taking 15 mg twice daily.  She verbalized understanding and she said she would go over to the pharmacy today to pick up her prescription for her Xarelto 20 mg and start taking one tablet daily.  Rodney Langton, MD, CDE, FAAFP [facility] [place], [place]

## 2012-04-08 ENCOUNTER — Other Ambulatory Visit (HOSPITAL_COMMUNITY)
Admission: RE | Admit: 2012-04-08 | Discharge: 2012-04-08 | Disposition: A | Discharge status: Home / Self Care | Payer: Self-pay | Source: Ambulatory Visit | Attending: Obstetrics & Gynecology | Admitting: Obstetrics & Gynecology

## 2012-04-08 ENCOUNTER — Encounter: Payer: Self-pay | Admitting: Obstetrics & Gynecology

## 2012-04-08 ENCOUNTER — Ambulatory Visit (INDEPENDENT_AMBULATORY_CARE_PROVIDER_SITE_OTHER): Payer: Self-pay | Admitting: Obstetrics & Gynecology

## 2012-04-08 VITALS — BP 118/86 | HR 81 | Temp 98.7°F | Ht 61.0 in | Wt 143.5 lb

## 2012-04-08 DIAGNOSIS — N920 Excessive and frequent menstruation with regular cycle: Secondary | ICD-10-CM | POA: Insufficient documentation

## 2012-04-08 DIAGNOSIS — Z01812 Encounter for preprocedural laboratory examination: Secondary | ICD-10-CM

## 2012-04-08 NOTE — Progress Notes (Signed)
Patient ID: Amanda Tran, female   DOB: 20-Jun-1965, 47 y.o.   MRN: [id number] Pt with a longtime h/o menorrhagia.  She has now increased bleeding since she has been on Xeralto.    The indications for endometrial biopsy were reviewed.   Risks of the biopsy including cramping, bleeding, infection, uterine perforation, inadequate specimen and need for additional procedures  were discussed. The patient states she understands and agrees to undergo procedure today. Consent was signed. Time out was performed. Urine HCG was negative. A sterile speculum was placed in the patient's vagina and the cervix was prepped with Betadine. A single-toothed tenaculum was placed on the anterior lip of the cervix to stabilize it. The 3 mm pipelle was introduced into the endometrial cavity without difficulty to a depth of 6cm, and a moderate amount of tissue was obtained and sent to pathology. The instruments were removed from the patient's vagina. Minimal bleeding from the cervix was noted. The patient tolerated the procedure well. Routine post-procedure instructions were given to the patient. The patient will follow up to review the results and for Mirena placement if there are no contraindications after the bx results.   Pt filled out the papers today for the [employer].  Halleigh Comes L. Harraway-Smith, M.D., [place]

## 2012-04-08 NOTE — Patient Instructions (Addendum)
Menorrhagia Dysfunctional uterine bleeding is different from a normal menstrual period. When periods are heavy or there is more bleeding than is usual for you, it is called menorrhagia. It may be caused by hormonal imbalance, or physical, metabolic, or other problems. Examination is necessary in order that your caregiver may treat treatable causes. If this is a continuing problem, a D&C may be needed. That means that the cervix (the opening of the uterus or womb) is dilated (stretched larger) and the lining of the uterus is scraped out. The tissue scraped out is then examined under a microscope by a specialist (pathologist) to make sure there is nothing of concern that needs further or more extensive treatment. HOME CARE INSTRUCTIONS   If medications were prescribed, take exactly as directed. Do not change or switch medications without consulting your caregiver.  Long term heavy bleeding may result in iron deficiency. Your caregiver may have prescribed iron pills. They help replace the iron your body lost from heavy bleeding. Take exactly as directed. Iron may cause constipation. If this becomes a problem, increase the bran, fruits, and roughage in your diet.  Do not take aspirin or medicines that contain aspirin one week before or during your menstrual period. Aspirin may make the bleeding worse.  If you need to change your sanitary pad or tampon more than once every 2 hours, stay in bed and rest as much as possible until the bleeding stops.  Eat well-balanced meals. Eat foods high in iron. Examples are leafy green vegetables, meat, liver, eggs, and whole grain breads and cereals. Do not try to lose weight until the abnormal bleeding has stopped and your blood iron level is back to normal. SEEK MEDICAL CARE IF:   You need to change your sanitary pad or tampon more than once an hour.  You develop nausea (feeling sick to your stomach) and vomiting, dizziness, or diarrhea while you are taking your  medicine.  You have any problems that may be related to the medicine you are taking. SEEK IMMEDIATE MEDICAL CARE IF:   You have a fever.  You develop chills.  You develop severe bleeding or start to pass blood clots.  You feel dizzy or faint. MAKE SURE YOU:   Understand these instructions.  Will watch your condition.  Will get help right away if you are not doing well or get worse. Document Released: 03/11/2005 Document Revised: 06/03/2011 Document Reviewed: 10/30/2007 ExitCare Patient Information 2013 [employer].  Levonorgestrel intrauterine device (IUD) What is this medicine? LEVONORGESTREL IUD (LEE voe nor jes trel) is a contraceptive (birth control) device. It is used to prevent pregnancy and to treat heavy bleeding that occurs during your period. It can be used for up to 5 years. This medicine may be used for other purposes; ask your health care provider or pharmacist if you have questions. What should I tell my health care provider before I take this medicine? They need to know if you have any of these conditions: -abnormal Pap smear -cancer of the breast, uterus, or cervix -diabetes -endometritis -genital or pelvic infection now or in the past -have more than one sexual partner or your partner has more than one partner -heart disease -history of an ectopic or tubal pregnancy -immune system problems -IUD in place -liver disease or tumor -problems with blood clots or take blood-thinners -use intravenous drugs -uterus of unusual shape -vaginal bleeding that has not been explained -an unusual or allergic reaction to levonorgestrel, other hormones, silicone, or polyethylene, medicines, foods,   dyes, or preservatives -pregnant or trying to get pregnant -breast-feeding How should I use this medicine? This device is placed inside the uterus by a health care professional. Talk to your pediatrician regarding the use of this medicine in children. Special care may be  needed. Overdosage: If you think you have taken too much of this medicine contact a poison control center or emergency room at once. NOTE: This medicine is only for you. Do not share this medicine with others. What if I miss a dose? This does not apply. What may interact with this medicine? Do not take this medicine with any of the following medications: -amprenavir -bosentan -fosamprenavir This medicine may also interact with the following medications: -aprepitant -barbiturate medicines for inducing sleep or treating seizures -bexarotene -griseofulvin -medicines to treat seizures like carbamazepine, ethotoin, felbamate, oxcarbazepine, phenytoin, topiramate -modafinil -pioglitazone -rifabutin -rifampin -rifapentine -some medicines to treat HIV infection like atazanavir, indinavir, lopinavir, nelfinavir, tipranavir, ritonavir -St. John's wort -warfarin This list may not describe all possible interactions. Give your health care provider a list of all the medicines, herbs, non-prescription drugs, or dietary supplements you use. Also tell them if you smoke, drink alcohol, or use illegal drugs. Some items may interact with your medicine. What should I watch for while using this medicine? Visit your doctor or health care professional for regular check ups. See your doctor if you or your partner has sexual contact with others, becomes HIV positive, or gets a sexual transmitted disease. This product does not protect you against HIV infection (AIDS) or other sexually transmitted diseases. You can check the placement of the IUD yourself by reaching up to the top of your vagina with clean fingers to feel the threads. Do not pull on the threads. It is a good habit to check placement after each menstrual period. Call your doctor right away if you feel more of the IUD than just the threads or if you cannot feel the threads at all. The IUD may come out by itself. You may become pregnant if the device  comes out. If you notice that the IUD has come out use a backup birth control method like condoms and call your health care provider. Using tampons will not change the position of the IUD and are okay to use during your period. What side effects may I notice from receiving this medicine? Side effects that you should report to your doctor or health care professional as soon as possible: -allergic reactions like skin rash, itching or hives, swelling of the face, lips, or tongue -fever, flu-like symptoms -genital sores -high blood pressure -no menstrual period for 6 weeks during use -pain, swelling, warmth in the leg -pelvic pain or tenderness -severe or sudden headache -signs of pregnancy -stomach cramping -sudden shortness of breath -trouble with balance, talking, or walking -unusual vaginal bleeding, discharge -yellowing of the eyes or skin Side effects that usually do not require medical attention (report to your doctor or health care professional if they continue or are bothersome): -acne -breast pain -change in sex drive or performance -changes in weight -cramping, dizziness, or faintness while the device is being inserted -headache -irregular menstrual bleeding within first 3 to 6 months of use -nausea This list may not describe all possible side effects. Call your doctor for medical advice about side effects. You may report side effects to FDA at 1-800-FDA-1088. Where should I keep my medicine? This does not apply. NOTE: This sheet is a summary. It may not cover all possible information. If   you have questions about this medicine, talk to your doctor, pharmacist, or health care provider.  2012, Elsevier/Gold Standard. (04/01/2008 6:39:08 PM) 

## 2012-04-29 ENCOUNTER — Ambulatory Visit: Payer: Self-pay | Admitting: Obstetrics & Gynecology

## 2012-04-30 ENCOUNTER — Encounter (HOSPITAL_COMMUNITY): Payer: Self-pay

## 2012-04-30 ENCOUNTER — Ambulatory Visit (INDEPENDENT_AMBULATORY_CARE_PROVIDER_SITE_OTHER): Payer: Self-pay | Admitting: Obstetrics & Gynecology

## 2012-04-30 ENCOUNTER — Emergency Department (HOSPITAL_COMMUNITY)
Admission: EM | Admit: 2012-04-30 | Discharge: 2012-04-30 | Disposition: A | Discharge status: Home / Self Care | Payer: Self-pay | Source: Home / Self Care | Attending: Family Medicine | Admitting: Family Medicine

## 2012-04-30 ENCOUNTER — Encounter: Payer: Self-pay | Admitting: Obstetrics & Gynecology

## 2012-04-30 VITALS — BP 120/85 | HR 109 | Ht 61.0 in | Wt 143.7 lb

## 2012-04-30 DIAGNOSIS — F17200 Nicotine dependence, unspecified, uncomplicated: Secondary | ICD-10-CM

## 2012-04-30 DIAGNOSIS — N920 Excessive and frequent menstruation with regular cycle: Secondary | ICD-10-CM

## 2012-04-30 DIAGNOSIS — M2140 Flat foot [pes planus] (acquired), unspecified foot: Secondary | ICD-10-CM

## 2012-04-30 DIAGNOSIS — M79609 Pain in unspecified limb: Secondary | ICD-10-CM

## 2012-04-30 DIAGNOSIS — N938 Other specified abnormal uterine and vaginal bleeding: Secondary | ICD-10-CM | POA: Insufficient documentation

## 2012-04-30 DIAGNOSIS — N949 Unspecified condition associated with female genital organs and menstrual cycle: Secondary | ICD-10-CM

## 2012-04-30 DIAGNOSIS — I82409 Acute embolism and thrombosis of unspecified deep veins of unspecified lower extremity: Secondary | ICD-10-CM

## 2012-04-30 DIAGNOSIS — M79606 Pain in leg, unspecified: Secondary | ICD-10-CM

## 2012-04-30 DIAGNOSIS — I82402 Acute embolism and thrombosis of unspecified deep veins of left lower extremity: Secondary | ICD-10-CM

## 2012-04-30 LAB — COMPREHENSIVE METABOLIC PANEL
ALT: 18 U/L (ref 0–35)
Alkaline Phosphatase: 76 U/L (ref 39–117)
BUN: 17 mg/dL (ref 6–23)
CO2: 23 mEq/L (ref 19–32)
Chloride: 105 mEq/L (ref 96–112)
GFR calc Af Amer: >90 mL/min (ref >90)
Glucose, Bld: 98 mg/dL (ref 70–99)
Potassium: 3.7 mEq/L (ref 3.5–5.1)
Sodium: 139 mEq/L (ref 135–145)
Total Bilirubin: 0.2 mg/dL — ABNORMAL LOW (ref 0.3–1.2)
Total Protein: 7.7 g/dL (ref 6.0–8.3)

## 2012-04-30 LAB — POCT URINALYSIS DIP (DEVICE)
Glucose, UA: NEGATIVE mg/dL
Leukocytes, UA: NEGATIVE
Nitrite: NEGATIVE
Protein, ur: 30 mg/dL — AB
Urobilinogen, UA: 1 mg/dL (ref 0.0–1.0)

## 2012-04-30 LAB — CBC
Hemoglobin: 14.3 g/dL (ref 12.0–15.0)
MCH: 27.4 pg (ref 26.0–34.0)
MCHC: 33.6 g/dL (ref 30.0–36.0)
MCV: 81.8 fL (ref 78.0–100.0)
Platelets: 238 10*3/uL (ref 150–400)
RBC: 5.21 MIL/uL — ABNORMAL HIGH (ref 3.87–5.11)

## 2012-04-30 MED ORDER — MEGESTROL ACETATE 40 MG PO TABS: 40.0000 mg | ORAL_TABLET | Freq: Every day | ORAL | Status: DC

## 2012-04-30 NOTE — Patient Instructions (Signed)
Dysfunctional Uterine Bleeding  Normally, menstrual periods begin between ages 11 to 17 in young women. A normal menstrual cycle/period may begin every 23 days up to 35 days and lasts from 1 to 7 days. Around 12 to 14 days before your menstrual period starts, ovulation (ovary produces an egg) occurs. When counting the time between menstrual periods, count from the first day of bleeding of the previous period to the first day of bleeding of the next period.  Dysfunctional (abnormal) uterine bleeding is bleeding that is different from a normal menstrual period. Your periods may come earlier or later than usual. They may be lighter, have blood clots or be heavier. You may have bleeding between periods, or you may skip one period or more. You may have bleeding after sexual intercourse, bleeding after menopause, or no menstrual period.  CAUSES   · Pregnancy (normal, miscarriage, tubal).  · IUDs (intrauterine device, birth control).  · Birth control pills.  · Hormone treatment.  · Menopause.  · Infection of the cervix.  · Blood clotting problems.  · Infection of the inside lining of the uterus.  · Endometriosis, inside lining of the uterus growing in the pelvis and other female organs.  · Adhesions (scar tissue) inside the uterus.  · Obesity or severe weight loss.  · Uterine polyps inside the uterus.  · Cancer of the vagina, cervix, or uterus.  · Ovarian cysts or polycystic ovary syndrome.  · Medical problems (diabetes, thyroid disease).  · Uterine fibroids (noncancerous tumor).  · Problems with your female hormones.  · Endometrial hyperplasia, very thick lining and enlarged cells inside of the uterus.  · Medicines that interfere with ovulation.  · Radiation to the pelvis or abdomen.  · Chemotherapy.  DIAGNOSIS   · Your doctor will discuss the history of your menstrual periods, medicines you are taking, changes in your weight, stress in your life, and any medical problems you may have.  · Your doctor will do a physical  and pelvic examination.  · Your doctor may want to perform certain tests to make a diagnosis, such as:  · Pap test.  · Blood tests.  · Cultures for infection.  · CT scan.  · Ultrasound.  · Hysteroscopy.  · Laparoscopy.  · MRI.  · Hysterosalpingography.  · D and C.  · Endometrial biopsy.  TREATMENT   Treatment will depend on the cause of the dysfunctional uterine bleeding (DUB). Treatment may include:  · Observing your menstrual periods for a couple of months.  · Prescribing medicines for medical problems, including:  · Antibiotics.  · Hormones.  · Birth control pills.  · Removing an IUD (intrauterine device, birth control).  · Surgery:  · D and C (scrape and remove tissue from inside the uterus).  · Laparoscopy (examine inside the abdomen with a lighted tube).  · Uterine ablation (destroy lining of the uterus with electrical current, laser, heat, or freezing).  · Hysteroscopy (examine cervix and uterus with a lighted tube).  · Hysterectomy (remove the uterus).  HOME CARE INSTRUCTIONS   · If medicines were prescribed, take exactly as directed. Do not change or switch medicines without consulting your caregiver.  · Long term heavy bleeding may result in iron deficiency. Your caregiver may have prescribed iron pills. They help replace the iron that your body lost from heavy bleeding. Take exactly as directed.  · Do not take aspirin or medicines that contain aspirin one week before or during your menstrual period. Aspirin may make   the bleeding worse.  · If you need to change your sanitary pad or tampon more than once every 2 hours, stay in bed with your feet elevated and a cold pack on your lower abdomen. Rest as much as possible, until the bleeding stops or slows down.  · Eat well-balanced meals. Eat foods high in iron. Examples are:  · Leafy green vegetables.  · Whole-grain breads and cereals.  · Eggs.  · Meat.  · Liver.  · Do not try to lose weight until the abnormal bleeding has stopped and your blood iron level is  back to normal. Do not lift more than ten pounds or do strenuous activities when you are bleeding.  · For a couple of months, make note on your calendar, marking the start and ending of your period, and the type of bleeding (light, medium, heavy, spotting, clots or missed periods). This is for your caregiver to better evaluate your problem.  SEEK MEDICAL CARE IF:   · You develop nausea (feeling sick to your stomach) and vomiting, dizziness, or diarrhea while you are taking your medicine.  · You are getting lightheaded or weak.  · You have any problems that may be related to the medicine you are taking.  · You develop pain with your DUB.  · You want to remove your IUD.  · You want to stop or change your birth control pills or hormones.  · You have any type of abnormal bleeding mentioned above.  · You are over 16 years old and have not had a menstrual period yet.  · You are 47 years old and you are still having menstrual periods.  · You have any of the symptoms mentioned above.  · You develop a rash.  SEEK IMMEDIATE MEDICAL CARE IF:   · An oral temperature above 102° F (38.9° C) develops.  · You develop chills.  · You are changing your sanitary pad or tampon more than once an hour.  · You develop abdominal pain.  · You pass out or faint.  Document Released: 03/08/2000 Document Revised: 06/03/2011 Document Reviewed: 02/07/2009  ExitCare® Patient Information ©2013 [employer], [employer].

## 2012-04-30 NOTE — Progress Notes (Signed)
Subjective:     Patient ID: Amanda Tran, female   DOB: May 31, 1965, 47 y.o.   MRN: [id number]  HPI  Pt here for results of Endo bx.  She reports that she has had no bleeding in the past month.  She is now unsure if she wants the Mirena since the Megace seems to be working well.  She denies pain or other adverse sx.  She is scheduled to be on the Megace for 6 months total.  Current Outpatient Prescriptions on File Prior to Visit  Medication Sig Dispense Refill  . acetaminophen (TYLENOL) 325 MG tablet Take 650 mg by mouth every 6 (six) hours as needed. For pain      . ferrous sulfate 325 (65 FE) MG tablet Take 1 tablet (325 mg total) by mouth 2 (two) times daily with a meal.  60 tablet  0  . Rivaroxaban (XARELTO) 20 MG TABS Take 1 tablet (20 mg total) by mouth daily.  30 tablet  5  . traMADol (ULTRAM) 50 MG tablet Take 1 tablet (50 mg total) by mouth every 6 (six) hours as needed for pain (severe dental pain).  30 tablet  0   Past Medical History  Diagnosis Date  . DVT (deep venous thrombosis)   No Known Allergies    Review of Systems     Objective:   Physical Exam BP 120/85  Pulse 109  Ht 5\' 1"  (1.549 m)  Wt 143 lb 11.2 oz (65.182 kg)  BMI 27.15 kg/m2  LMP 03/14/2012 Exam deferred  surg path 04/08/12 Diagnosis Endometrium, biopsy - SUPERFICIAL FRAGMENTS OF SECRETORY ENDOMETRIAL GLANDS., NO ATYPIA, HYPERPLASIA OR MALIGNANCY. - BENIGN SQUAMOUS MUCOSA AND ENDOCERVICAL MUCOSA, NO DYSPLASIA OR MALIGNANCY.    Assessment:     DUB.  Sx improved on Megace.  Pt undecided re Mirena as Megace is working well.  Will decide if grant comes through      Plan:     Megace 40 mg  Po q day F/u 3 months or sooner prn

## 2012-04-30 NOTE — ED Notes (Signed)
Patient complains of pain to the right leg for about a week. Has a history of DVT

## 2012-04-30 NOTE — ED Provider Notes (Signed)
History     CSN: [id number]  Arrival date & time 04/30/12  1542   First MD Initiated Contact with Patient 04/30/12 1603      Chief Complaint  Patient presents with  . Leg Pain    HPI Pt reports that she has had pain in right leg after standing for long periods of time at work.  She is working and standing for 9 hours at a time.  Pt reports that she is having some dark urine.  NO polyuria or dysuria.  Pt says that she has long had flat arches.  She is requesting a letter to give to her job saying that she can't stand for long periods of time because of the pain.  Pt says that she is taking her blood thinner regularly and has no trouble getting the medication from the pharmacy. PT reports that her urine has been dark but no dysuria..  Pt was recently seen by her gynecologist and pt reported she got a clean bill of health.   Past Medical History  Diagnosis Date  . DVT (deep venous thrombosis)     Past Surgical History  Procedure Date  . Cesarean section     Family History  Problem Relation Age of Onset  . Diabetes Neg Hx     History  Substance Use Topics  . Smoking status: Current Every Day Smoker  . Smokeless tobacco: Never Used  . Alcohol Use: No    OB History    Grav Para Term Preterm Abortions TAB SAB Ect Mult Living                  Review of Systems  Constitutional: Positive for fatigue.  Musculoskeletal:       Leg pain and foot pain   All other systems reviewed and are negative.    Allergies  Review of patient's allergies indicates no known allergies.  Home Medications   Current Outpatient Rx  Name  Route  Sig  Dispense  Refill  . ACETAMINOPHEN 325 MG PO TABS   Oral   Take 650 mg by mouth every 6 (six) hours as needed. For pain         . FERROUS SULFATE 325 (65 FE) MG PO TABS   Oral   Take 1 tablet (325 mg total) by mouth 2 (two) times daily with a meal.   60 tablet   0   . MEGESTROL ACETATE 40 MG PO TABS   Oral   Take 1 tablet (40 mg  total) by mouth daily.   30 tablet   3   . RIVAROXABAN 20 MG PO TABS   Oral   Take 1 tablet (20 mg total) by mouth daily.   30 tablet   5   . TRAMADOL HCL 50 MG PO TABS   Oral   Take 1 tablet (50 mg total) by mouth every 6 (six) hours as needed for pain (severe dental pain).   30 tablet   0     BP 109/83  Pulse 97  Temp 98.6 F (37 C) (Oral)  Resp 16  SpO2 100%  LMP 03/14/2012  Physical Exam  Nursing note and vitals reviewed. Constitutional: She is oriented to person, place, and time. She appears well-developed and well-nourished. No distress.  HENT:  Head: Normocephalic and atraumatic.  Nose: Nose normal.  Eyes: EOM are normal. Pupils are equal, round, and reactive to light.  Neck: Normal range of motion. Neck supple.  Cardiovascular: Normal rate, regular  rhythm and normal heart sounds.   Pulmonary/Chest: Effort normal.  Abdominal: Soft. Bowel sounds are normal.  Musculoskeletal: Normal range of motion. She exhibits no edema.       Mild tenderness in right tibia, no edema, no cords, no calf tenderness bilat pes planus  Lymphadenopathy:    She has no cervical adenopathy.  Neurological: She is alert and oriented to person, place, and time.  Skin: Skin is warm and dry.  Psychiatric: She has a normal mood and affect. Her behavior is normal. Judgment and thought content normal.    ED Course  Procedures (including critical care time)   Labs Reviewed  CK  COMPREHENSIVE METABOLIC PANEL  HEMOGLOBIN A1C  CBC   No results found.   No diagnosis found.    MDM  IMPRESSION  Leg pain  DVT LLE  Chronic anticoagulation  Dark Urine  RECOMMENDATIONS / PLAN Asked pt to start using arches in shoes with shoe inserts.  She is taking her anticoagulant daily and has no symptoms in her left leg.  Check a UA dip today.  Check CK level.  Check blood glucose to rule out hyperglycemia.     FOLLOW UP 2 months   The patient was given clear instructions to go to ER or  return to medical center if symptoms don't improve, worsen or new problems develop.  The patient verbalized understanding.  The patient was told to call to get lab results if they haven't heard anything in the next week.            Cleora Fleet, MD 05/01/12 [id number]

## 2012-05-01 LAB — HEMOGLOBIN A1C: Hgb A1c MFr Bld: 5.2 % (ref <5.7)

## 2012-05-04 ENCOUNTER — Telehealth (HOSPITAL_COMMUNITY): Payer: Self-pay

## 2012-05-04 NOTE — Telephone Encounter (Signed)
Message copied by Lestine Mount on Mon May 04, 2012 12:06 PM ------      Message from: Cleora Fleet      Created: Sat May 02, 2012 11:02 PM       Please notify patient that labs came back OK.  Her hemoglobin is much better and back to normal.   Labs should be rechecked in 3-4 months.                   Rodney Langton, MD, CDE, FAAFP      [facility]      [facility]      [place], [place]        ------

## 2012-06-03 ENCOUNTER — Telehealth: Payer: Self-pay | Admitting: *Deleted

## 2012-06-03 NOTE — Telephone Encounter (Signed)
Spoke with Dr. Burnice Logan Katrinka Blazing- instructed to tell pt. If her bleeding doesn't stop in one more week to call and make appointment to be seen- also review your information on Mirena- may still be an option. Need to call patient and also call [place] to see if was approved for Mirena

## 2012-06-03 NOTE — Telephone Encounter (Signed)
Called Amanda Tran and she reports she is calling because she has started back bleeding for about 1.5 weeks now - states it is heavy, but not as heavy as it was once- states changed pad about 3-4 times a day- is taking the megace once daily and her blood thinner- wanted to know if that was too much bleeding since she is the blood thinner ( xerelto). Informed her I would need to check with a doctor and call her back. Also discussed she and doctor dicussed she didn't need the iud after her biopsy came back- and never heard if she qualified for the free iud. Saw Dr. Burnice LoganKatrinka Blazing last.

## 2012-06-03 NOTE — Telephone Encounter (Signed)
Called [name] and informed her of Dr. Danne Harbor reccomendations- she will call back if bleeding continues for another week- will go to [facility] if heavy bleeding.

## 2012-06-03 NOTE — Telephone Encounter (Signed)
Patient was approved for free IUd- and is in cabinet awaiting insertion.

## 2012-06-03 NOTE — Telephone Encounter (Signed)
[name] called 06/02/12 and left a message she is trying to reach her gyn doctor and request a call back.

## 2012-07-30 ENCOUNTER — Emergency Department (INDEPENDENT_AMBULATORY_CARE_PROVIDER_SITE_OTHER): Payer: Self-pay

## 2012-07-30 ENCOUNTER — Encounter (HOSPITAL_COMMUNITY): Payer: Self-pay

## 2012-07-30 ENCOUNTER — Emergency Department (HOSPITAL_COMMUNITY)
Admission: EM | Admit: 2012-07-30 | Discharge: 2012-07-30 | Disposition: A | Discharge status: Home / Self Care | Payer: Self-pay | Source: Home / Self Care

## 2012-07-30 DIAGNOSIS — I82409 Acute embolism and thrombosis of unspecified deep veins of unspecified lower extremity: Secondary | ICD-10-CM

## 2012-07-30 DIAGNOSIS — G629 Polyneuropathy, unspecified: Secondary | ICD-10-CM

## 2012-07-30 DIAGNOSIS — G609 Hereditary and idiopathic neuropathy, unspecified: Secondary | ICD-10-CM

## 2012-07-30 DIAGNOSIS — I82402 Acute embolism and thrombosis of unspecified deep veins of left lower extremity: Secondary | ICD-10-CM

## 2012-07-30 MED ORDER — PREGABALIN 100 MG PO CAPS: 150.0000 mg | ORAL_CAPSULE | Freq: Two times a day (BID) | ORAL | Status: DC

## 2012-07-30 NOTE — ED Provider Notes (Addendum)
History     CSN: [id number]  Arrival date & time 07/30/12  1510   None     Chief Complaint  Patient presents with  . Foot Pain   47 year-old female presents presents to [facility] for a followup. She complains of burning sensation in her feet. She is on a diabetic. She works 7 hours a day. She is on her feet for most part. She recently bought shoes were $200 with good sole inserts that hasn't helped her. She wants [place] to refer her to her podiatrist. She also wants [place] to repeat her Doppler to make sure her DVT has resolved.  HPI  Past Medical History  Diagnosis Date  . DVT (deep venous thrombosis)     Past Surgical History  Procedure Laterality Date  . Cesarean section      Family History  Problem Relation Age of Onset  . Diabetes Neg Hx     History  Substance Use Topics  . Smoking status: Current Every Day Smoker  . Smokeless tobacco: Never Used  . Alcohol Use: No    OB History   Grav Para Term Preterm Abortions TAB SAB Ect Mult Living                  Review of Systems As in history of present illness Allergies  Review of patient's allergies indicates no known allergies.  Home Medications   Current Outpatient Rx  Name  Route  Sig  Dispense  Refill  . acetaminophen (TYLENOL) 325 MG tablet   Oral   Take 650 mg by mouth every 6 (six) hours as needed. For pain         . ferrous sulfate 325 (65 FE) MG tablet   Oral   Take 1 tablet (325 mg total) by mouth 2 (two) times daily with a meal.   60 tablet   0   . megestrol (MEGACE) 40 MG tablet   Oral   Take 1 tablet (40 mg total) by mouth daily.   30 tablet   3   . pregabalin (LYRICA) 100 MG capsule   Oral   Take 2 capsules (200 mg total) by mouth 2 (two) times daily.   30 capsule   0   . Rivaroxaban (XARELTO) 20 MG TABS   Oral   Take 1 tablet (20 mg total) by mouth daily.   30 tablet   5   . traMADol (ULTRAM) 50 MG tablet   Oral   Take 1 tablet (50 mg total) by mouth every 6 (six) hours as needed  for pain (severe dental pain).   30 tablet   0     BP 109/79  Pulse 95  Temp(Src) 97.7 F (36.5 C) (Oral)  Resp 18  SpO2 100%  Physical Exam Neck: Normal range of motion. Neck supple.  Cardiovascular: Normal rate, regular rhythm and normal heart sounds.  Pulmonary/Chest: Effort normal.  Abdominal: Soft. Bowel sounds are normal.  Musculoskeletal: Normal range of motion. She exhibits no edema.  Mild tenderness in right tibia, no edema, no cords, no calf tenderness bilat pes planus  Lymphadenopathy:  She has no cervical adenopathy.  Neurological: She is alert and oriented to person, place, and time.  Skin: Skin is warm and dry.  Psychiatric: She has a normal mood and affect. Her behavior is normal. Judgment and thought content normal.   ED Course  Procedures (including critical care time)  Labs Reviewed - No data to display No results found.  1. Left leg DVT   2. Peripheral neuropathy       MDM  Continue sole inserts Podiatry referral, Lyrica for neuropathy Advised to continue xarelto for a total of 6 months, then discontinue Per patient request repeat Doppler to rule out DVT Followup in one month        Richarda Overlie, MD 07/30/12 1552  Richarda Overlie, MD 07/30/12 1553

## 2012-07-30 NOTE — ED Notes (Signed)
Patient complains of pain in both feet Burning and stinging Going on for two weeks

## 2012-07-31 ENCOUNTER — Ambulatory Visit (HOSPITAL_COMMUNITY)
Admit: 2012-07-31 | Discharge: 2012-07-31 | Disposition: A | Discharge status: Home / Self Care | Payer: Self-pay | Attending: Internal Medicine | Admitting: Internal Medicine

## 2012-07-31 DIAGNOSIS — Z86718 Personal history of other venous thrombosis and embolism: Secondary | ICD-10-CM | POA: Insufficient documentation

## 2012-07-31 DIAGNOSIS — M79609 Pain in unspecified limb: Secondary | ICD-10-CM

## 2012-07-31 NOTE — Progress Notes (Signed)
*  Preliminary Results* Bilateral lower extremity venous duplex completed. 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  Bilateral lower extremities are negative for deep vein thrombosis. There is no evidence of Baker's cyst bilaterally. Attempted to call preliminary [place] to Dr.Abrol at [phone], however there was no answer. Patient will be discharged, if needed she can be reached by phone.  07/31/2012 3:52 PM Gertie Fey, RDMS, RDCS

## 2012-08-20 ENCOUNTER — Telehealth: Payer: Self-pay

## 2012-08-20 NOTE — Telephone Encounter (Signed)
Pt prescribed lyrica on 5/28 and tried to have script filled at [facility] but too costly, pt asking [facility].

## 2012-08-21 ENCOUNTER — Telehealth: Payer: Self-pay | Admitting: *Deleted

## 2012-08-21 NOTE — Telephone Encounter (Signed)
Pt says she was supposed to call back with price for [facility], but they said they do not carry narcotics.  Pt would like a script for pain

## 2012-08-21 NOTE — Telephone Encounter (Signed)
08/21/12 Spoke with patient stated that [facility] do have narcotics. Please sent in prescription for pain medication because patient can not afford To get Lyrica filled and Ultram does not help her pain . P.Luiz Ochoa

## 2012-08-21 NOTE — Telephone Encounter (Signed)
OK sounds good. Looks like there is nothing we need to do on our end. Thanks AW.

## 2012-08-21 NOTE — Telephone Encounter (Signed)
08/21/12 Spoke with patient regarding Lyrica  Patient stated she would have [facility] pharmacy  Transfer prescription to the [facility]. P.Jourdon Zimmerle RN BSN [facility]

## 2012-08-21 NOTE — Telephone Encounter (Signed)
08/21/12 Spoke with patient regarding medication stated that she would try neurontin please have MD write prescription and patient will come and pick it up Today. Thanks P.[facility] BSN [facility]

## 2012-08-21 NOTE — Telephone Encounter (Signed)
No narcotics for this issue, as other meds are more appropriate.  If the [facility] has neurontin we can try that in place of lyrica.  Thanks AW

## 2012-08-24 ENCOUNTER — Telehealth: Payer: Self-pay | Admitting: *Deleted

## 2012-08-24 ENCOUNTER — Other Ambulatory Visit: Payer: Self-pay | Admitting: Family Medicine

## 2012-08-24 DIAGNOSIS — G894 Chronic pain syndrome: Secondary | ICD-10-CM

## 2012-08-24 MED ORDER — GABAPENTIN 300 MG PO CAPS: ORAL_CAPSULE | ORAL | Status: DC

## 2012-08-24 NOTE — Telephone Encounter (Signed)
Script printed.

## 2012-08-24 NOTE — Telephone Encounter (Signed)
08/24/12 Patient made aware that her prescription is ready for pick up. P.Christerpher Clos,RN BSN

## 2012-08-28 ENCOUNTER — Encounter (HOSPITAL_COMMUNITY): Payer: Self-pay | Admitting: *Deleted

## 2012-08-28 ENCOUNTER — Emergency Department (HOSPITAL_COMMUNITY)
Admission: EM | Admit: 2012-08-28 | Discharge: 2012-08-28 | Disposition: A | Discharge status: Home / Self Care | Payer: Self-pay | Attending: Emergency Medicine | Admitting: Emergency Medicine

## 2012-08-28 DIAGNOSIS — Z86718 Personal history of other venous thrombosis and embolism: Secondary | ICD-10-CM | POA: Insufficient documentation

## 2012-08-28 DIAGNOSIS — Z79899 Other long term (current) drug therapy: Secondary | ICD-10-CM | POA: Insufficient documentation

## 2012-08-28 DIAGNOSIS — F17200 Nicotine dependence, unspecified, uncomplicated: Secondary | ICD-10-CM | POA: Insufficient documentation

## 2012-08-28 DIAGNOSIS — K0889 Other specified disorders of teeth and supporting structures: Secondary | ICD-10-CM

## 2012-08-28 DIAGNOSIS — K089 Disorder of teeth and supporting structures, unspecified: Secondary | ICD-10-CM | POA: Insufficient documentation

## 2012-08-28 MED ORDER — HYDROCODONE-ACETAMINOPHEN 5-325 MG PO TABS: 1.0000 | ORAL_TABLET | Freq: Three times a day (TID) | ORAL | Status: DC | PRN

## 2012-08-28 MED ORDER — PENICILLIN V POTASSIUM 500 MG PO TABS: 500.0000 mg | ORAL_TABLET | Freq: Four times a day (QID) | ORAL | Status: AC

## 2012-08-28 MED ORDER — HYDROCODONE-ACETAMINOPHEN 5-325 MG PO TABS
1.0000 | ORAL_TABLET | Freq: Once | ORAL | Status: AC
  Administered 2012-08-28: 1 via ORAL
  Filled 2012-08-28: qty 1

## 2012-08-28 NOTE — ED Provider Notes (Signed)
History     CSN: [id number]  Arrival date & time 08/28/12  0929   First MD Initiated Contact with Patient 08/28/12 [phone]      Chief Complaint  Patient presents with  . Dental Pain    (Consider location/radiation/quality/duration/timing/severity/associated sxs/prior treatment) HPI [name] L Giddens is a 47 y/o F with PMHx of DVT on Xarelto, presenting to the ED with dental pain to the left upper jaw that has been ongoing x 1 day, described as a "pain" where the patient is unable to described exact discomfort, with radiation to the left temple, ear, and neck. Stated that she has been using Tylenol with minimal relief. Reported that cold fluids are worse than hot fluids, pain with chewing. Denied fever, chills, sweating, bleeding, drainage, discharge, cyst or abscess formation, neck stiffness, neurological deficits.  Patient stated that she has not been to a dentist in 3 years or more.   Past Medical History  Diagnosis Date  . DVT (deep venous thrombosis)     Past Surgical History  Procedure Laterality Date  . Cesarean section      Family History  Problem Relation Age of Onset  . Diabetes Neg Hx     History  Substance Use Topics  . Smoking status: Current Every Day Smoker  . Smokeless tobacco: Never Used  . Alcohol Use: No    OB History   Grav Para Term Preterm Abortions TAB SAB Ect Mult Living                  Review of Systems  Constitutional: Negative for fever, chills and fatigue.  HENT: Positive for dental problem. Negative for ear pain, congestion, sore throat, facial swelling, rhinorrhea, trouble swallowing, neck pain and neck stiffness.   Eyes: Negative for pain and visual disturbance.  Respiratory: Negative for chest tightness and shortness of breath.   Cardiovascular: Negative for chest pain.  Gastrointestinal: Negative for nausea, vomiting, abdominal pain, diarrhea, constipation, blood in stool and anal bleeding.  Neurological: Negative for dizziness,  weakness, light-headedness, numbness and headaches.  All other systems reviewed and are negative.    Allergies  Review of patient's allergies indicates no known allergies.  Home Medications   Current Outpatient Rx  Name  Route  Sig  Dispense  Refill  . acetaminophen (TYLENOL) 325 MG tablet   Oral   Take 650 mg by mouth every 6 (six) hours as needed. For pain         . ferrous sulfate 325 (65 FE) MG tablet   Oral   Take 1 tablet (325 mg total) by mouth 2 (two) times daily with a meal.   60 tablet   0   . megestrol (MEGACE) 40 MG tablet   Oral   Take 1 tablet (40 mg total) by mouth daily.   30 tablet   3   . Rivaroxaban (XARELTO) 20 MG TABS   Oral   Take 1 tablet (20 mg total) by mouth daily.   30 tablet   5   . traMADol (ULTRAM) 50 MG tablet   Oral   Take 1 tablet (50 mg total) by mouth every 6 (six) hours as needed for pain (severe dental pain).   30 tablet   0   . HYDROcodone-acetaminophen (NORCO) 5-325 MG per tablet   Oral   Take 1 tablet by mouth every 8 (eight) hours as needed for pain.   6 tablet   0   . penicillin v potassium (VEETID) 500 MG tablet  Oral   Take 1 tablet (500 mg total) by mouth 4 (four) times daily.   40 tablet   0     BP 122/89  Pulse 91  Temp(Src) 98.3 F (36.8 C)  Resp 20  SpO2 98%  LMP 04/03/2012  Physical Exam  Nursing note and vitals reviewed. Constitutional: She is oriented to person, place, and time. She appears well-developed and well-nourished. No distress.  HENT:  Head: Normocephalic and atraumatic. No trismus in the jaw.  Mouth/Throat: Uvula is midline, oropharynx is clear and moist and mucous membranes are normal. She does not have dentures. No oral lesions. Dental caries present. No dental abscesses, edematous or lacerations. No oropharyngeal exudate, posterior oropharyngeal edema, posterior oropharyngeal erythema or tonsillar abscesses.    Negative facial swelling, erythema, inflammation noted  Mouth:  Negative swelling, erythema, inflammation, lesions, sores noted to the buccal mucosa bilaterally. Negative abscess and cyst formation noted. Negative bleeding and drainage. Poor dentition. Missing teeth too left upper jaw - left upper second molar darkened color - pain upon palpation to the tooth with tongue depressor Negative trismus Negative sublingual lesion  Eyes: Conjunctivae and EOM are normal. Pupils are equal, round, and reactive to light.  Neck: Normal range of motion. Neck supple.  Negative neck stiffness  Negative nuchal rigidity  Mild discomfort upon palpation to the left upper neck with radiation to the tooth that is affecting her  Negative masses felt Negative lymphadenopathy Negative asymmetry  Cardiovascular: Normal rate, regular rhythm and normal heart sounds.  Exam reveals no friction rub.   No murmur heard. Pulses:      Radial pulses are 2+ on the right side, and 2+ on the left side.  Pulmonary/Chest: Effort normal and breath sounds normal. No respiratory distress. She has no wheezes. She has no rales.  Lymphadenopathy:    She has no cervical adenopathy.  Neurological: She is alert and oriented to person, place, and time. No cranial nerve deficit. She exhibits normal muscle tone. Coordination normal.  Skin: Skin is warm and dry. No rash noted. She is not diaphoretic. No erythema.  Psychiatric: She has a normal mood and affect. Her behavior is normal. Thought content normal.    ED Course  Procedures (including critical care time)  Medications  HYDROcodone-acetaminophen (NORCO/VICODIN) 5-325 MG per tablet 1 tablet (1 tablet Oral Given 08/28/12 1121)     Labs Reviewed - No data to display No results found.   1. Pain, dental   2. History of DVT (deep vein thrombosis)       MDM  Negative posterior oropharynx swelling noted - less likely to be peritonsillar abscess. Negative sublingual cyst and trismus - less likely to be Ludwig's Angina. Poor dentition with  darkened discoloration to the left upper second molar - poor dentition and oral hygiene leading to pain suspected. Negative meningeal signs. Patient stable, afebrile. Discharged patient - discharged with antibiotics and pain medications - discussed course, precautions, and disposal techniques. Referred patient to dentist - resource guide given. Discussed with patient to rest and stay hydrated. Discussed with patient to monitor symptoms and if symptoms are to worsen or change to report back to the ED - strict return instructions given. Patient agreed to plan of care, understood, all questions answered.        Raymon Mutton, PA-C 08/28/12 1656  Niraj Kudrna, PA-C 08/28/12 1716

## 2012-08-28 NOTE — ED Notes (Signed)
Pt reports left side upper dental pain since yesterday. Airway intact.

## 2012-08-29 NOTE — ED Provider Notes (Signed)
Medical screening examination/treatment/procedure(s) were performed by non-physician practitioner and as supervising physician I was immediately available for consultation/collaboration.   Loren Racer, MD 08/29/12 [phone]

## 2012-09-08 ENCOUNTER — Emergency Department (HOSPITAL_COMMUNITY)
Admission: EM | Admit: 2012-09-08 | Discharge: 2012-09-08 | Disposition: A | Discharge status: Home / Self Care | Payer: Self-pay | Attending: Emergency Medicine | Admitting: Emergency Medicine

## 2012-09-08 ENCOUNTER — Encounter (HOSPITAL_COMMUNITY): Payer: Self-pay | Admitting: Physical Medicine and Rehabilitation

## 2012-09-08 DIAGNOSIS — K0889 Other specified disorders of teeth and supporting structures: Secondary | ICD-10-CM

## 2012-09-08 DIAGNOSIS — K089 Disorder of teeth and supporting structures, unspecified: Secondary | ICD-10-CM | POA: Insufficient documentation

## 2012-09-08 DIAGNOSIS — Z86718 Personal history of other venous thrombosis and embolism: Secondary | ICD-10-CM | POA: Insufficient documentation

## 2012-09-08 DIAGNOSIS — Z79899 Other long term (current) drug therapy: Secondary | ICD-10-CM | POA: Insufficient documentation

## 2012-09-08 DIAGNOSIS — F17200 Nicotine dependence, unspecified, uncomplicated: Secondary | ICD-10-CM | POA: Insufficient documentation

## 2012-09-08 MED ORDER — AMOXICILLIN 500 MG PO CAPS: 500.0000 mg | ORAL_CAPSULE | Freq: Three times a day (TID) | ORAL | Status: DC

## 2012-09-08 MED ORDER — ACETAMINOPHEN 325 MG PO TABS
975.0000 mg | ORAL_TABLET | Freq: Once | ORAL | Status: AC
  Administered 2012-09-08: 975 mg via ORAL
  Filled 2012-09-08: qty 3

## 2012-09-08 MED ORDER — IBUPROFEN 400 MG PO TABS
800.0000 mg | ORAL_TABLET | Freq: Once | ORAL | Status: AC
  Administered 2012-09-08: 800 mg via ORAL
  Filled 2012-09-08: qty 2

## 2012-09-08 MED ORDER — AMOXICILLIN 500 MG PO CAPS
500.0000 mg | ORAL_CAPSULE | Freq: Once | ORAL | Status: AC
  Administered 2012-09-08: 500 mg via ORAL
  Filled 2012-09-08: qty 1

## 2012-09-08 NOTE — ED Notes (Signed)
Pt presents to department for evaluation of L upper and lower toothache. Ongoing x2 days. 10/10 pain. No facial swelling noted. Respirations unlabored.

## 2012-09-08 NOTE — ED Provider Notes (Signed)
History     CSN: [id number]  Arrival date & time 09/08/12  1103   First MD Initiated Contact with Patient 09/08/12 1124      Chief Complaint  Patient presents with  . Dental Pain    (Consider location/radiation/quality/duration/timing/severity/associated sxs/prior treatment) HPI  Amanda Tran is a 47 y.o. female complaining of left upper and lower tooth pain worsening over the course last 2 days. Pain is rated at 10 out of 10, exacerbated by chewing. He is taking over-the-counter pain medications with no relief. She denies fever, chills, drooling.   Past Medical History  Diagnosis Date  . DVT (deep venous thrombosis)     Past Surgical History  Procedure Laterality Date  . Cesarean section      Family History  Problem Relation Age of Onset  . Diabetes Neg Hx     History  Substance Use Topics  . Smoking status: Current Every Day Smoker    Types: Cigarettes  . Smokeless tobacco: Never Used  . Alcohol Use: No    OB History   Grav Para Term Preterm Abortions TAB SAB Ect Mult Living                  Review of Systems  Constitutional:       Negative except as described in HPI  HENT:       Negative except as described in HPI  Respiratory:       Negative except as described in HPI  Cardiovascular:       Negative except as described in HPI  Gastrointestinal:       Negative except as described in HPI  Genitourinary:       Negative except as described in HPI  Musculoskeletal:       Negative except as described in HPI  Skin:       Negative except as described in HPI  Neurological:       Negative except as described in HPI  All other systems reviewed and are negative.    Allergies  Review of patient's allergies indicates no known allergies.  Home Medications   Current Outpatient Rx  Name  Route  Sig  Dispense  Refill  . acetaminophen (TYLENOL) 325 MG tablet   Oral   Take 650 mg by mouth every 6 (six) hours as needed. For pain         .  ferrous sulfate 325 (65 FE) MG tablet   Oral   Take 1 tablet (325 mg total) by mouth 2 (two) times daily with a meal.   60 tablet   0   . megestrol (MEGACE) 40 MG tablet   Oral   Take 1 tablet (40 mg total) by mouth daily.   30 tablet   3   . Rivaroxaban (XARELTO) 20 MG TABS   Oral   Take 1 tablet (20 mg total) by mouth daily.   30 tablet   5   . amoxicillin (AMOXIL) 500 MG capsule   Oral   Take 1 capsule (500 mg total) by mouth 3 (three) times daily.   21 capsule   0     BP 113/85  Pulse 80  Temp(Src) 98.6 F (37 C) (Oral)  Resp 16  SpO2 99%  Physical Exam  Nursing note and vitals reviewed. Constitutional: She is oriented to person, place, and time. She appears well-developed and well-nourished. No distress.  HENT:  Head: Normocephalic.  Generally poor dentition, no gingival swelling, erythema or tenderness  to palpation. Patient is handling their secretions. There is no tenderness to palpation or firmness underneath tongue bilaterally. No trismus.    Eyes: Conjunctivae and EOM are normal. Pupils are equal, round, and reactive to light.  Cardiovascular: Normal rate.   Pulmonary/Chest: Effort normal and breath sounds normal. No stridor.  Musculoskeletal: Normal range of motion.  Neurological: She is alert and oriented to person, place, and time.  Psychiatric: She has a normal mood and affect.    ED Course  Procedures (including critical care time)  Labs Reviewed - No data to display No results found.   1. Pain, dental       MDM   Filed Vitals:   09/08/12 1111  BP: 113/85  Pulse: 80  Temp: 98.6 F (37 C)  TempSrc: Oral  Resp: 16  SpO2: 99%     Amanda Tran is a 47 y.o. female Pwith toothache.  No gross abscess.  Exam unconcerning for Ludwig's angina or spread of infection.  Will treat with penicillin and pain medicine.  Urged patient to follow-up with dentist.    Patient defers pain medication. She states that narcotic pain medication  does not agree with her.  Medications  amoxicillin (AMOXIL) capsule 500 mg (500 mg Oral Given 09/08/12 1207)  ibuprofen (ADVIL,MOTRIN) tablet 800 mg (800 mg Oral Given 09/08/12 1207)  acetaminophen (TYLENOL) tablet 975 mg (975 mg Oral Given 09/08/12 1209)    Pt is hemodynamically stable, appropriate for, and amenable to discharge at this time. Pt verbalized understanding and agrees with care plan. Outpatient follow-up and specific return precautions discussed.    Discharge Medication List as of 09/08/2012 11:56 AM    START taking these medications   Details  amoxicillin (AMOXIL) 500 MG capsule Take 1 capsule (500 mg total) by mouth 3 (three) times daily., Starting 09/08/2012, Until Discontinued, [employer], PA-C 09/08/12 [phone]

## 2012-09-08 NOTE — ED Notes (Signed)
PT ambulated with baseline gait; VSS; A&Ox3; no signs of distress; respirations even and unlabored; skin warm and dry; no questions upon discharge.  

## 2012-09-09 NOTE — ED Provider Notes (Signed)
Medical screening examination/treatment/procedure(s) were performed by non-physician practitioner and as supervising physician I was immediately available for consultation/collaboration.   Asher Babilonia M Monic Engelmann, DO 09/09/12 0946 

## 2012-09-30 ENCOUNTER — Ambulatory Visit: Payer: Self-pay | Attending: Family Medicine

## 2012-09-30 ENCOUNTER — Other Ambulatory Visit: Payer: Self-pay

## 2012-09-30 DIAGNOSIS — Z111 Encounter for screening for respiratory tuberculosis: Secondary | ICD-10-CM

## 2012-10-02 ENCOUNTER — Encounter: Payer: Self-pay | Admitting: *Deleted

## 2012-10-02 ENCOUNTER — Ambulatory Visit: Payer: Self-pay | Attending: Family Medicine

## 2012-10-02 LAB — TB SKIN TEST
Induration: 0 mm
TB Skin Test: NEGATIVE

## 2012-10-02 NOTE — Progress Notes (Unsigned)
Patient presents forTb skin test reading.  Results: negative

## 2012-10-07 ENCOUNTER — Ambulatory Visit: Payer: Self-pay

## 2013-01-08 ENCOUNTER — Emergency Department (HOSPITAL_COMMUNITY)
Admission: EM | Admit: 2013-01-08 | Discharge: 2013-01-08 | Disposition: A | Discharge status: Home / Self Care | Payer: Self-pay | Attending: Emergency Medicine | Admitting: Emergency Medicine

## 2013-01-08 ENCOUNTER — Emergency Department (HOSPITAL_COMMUNITY): Payer: Self-pay

## 2013-01-08 ENCOUNTER — Encounter (HOSPITAL_COMMUNITY): Payer: Self-pay | Admitting: Emergency Medicine

## 2013-01-08 DIAGNOSIS — Z79899 Other long term (current) drug therapy: Secondary | ICD-10-CM | POA: Insufficient documentation

## 2013-01-08 DIAGNOSIS — B349 Viral infection, unspecified: Secondary | ICD-10-CM

## 2013-01-08 DIAGNOSIS — B9789 Other viral agents as the cause of diseases classified elsewhere: Secondary | ICD-10-CM | POA: Insufficient documentation

## 2013-01-08 DIAGNOSIS — F17200 Nicotine dependence, unspecified, uncomplicated: Secondary | ICD-10-CM | POA: Insufficient documentation

## 2013-01-08 DIAGNOSIS — Z86718 Personal history of other venous thrombosis and embolism: Secondary | ICD-10-CM | POA: Insufficient documentation

## 2013-01-08 DIAGNOSIS — Z792 Long term (current) use of antibiotics: Secondary | ICD-10-CM | POA: Insufficient documentation

## 2013-01-08 MED ORDER — HYDROCODONE-HOMATROPINE 5-1.5 MG/5ML PO SYRP: 5.0000 mL | ORAL_SOLUTION | Freq: Four times a day (QID) | ORAL | Status: DC | PRN

## 2013-01-08 NOTE — ED Provider Notes (Signed)
CSN: [id number]     Arrival date & time 01/08/13  1608 History   First MD Initiated Contact with Patient 01/08/13 1616     Chief Complaint  Patient presents with  . Cough  . Chills   (Consider location/radiation/quality/duration/timing/severity/associated sxs/prior Treatment) HPI Comments: Chills, runny nose, subjective fever  Patient is a 47 y.o. female presenting with cough. The history is provided by the patient. No language interpreter was used.  Cough Cough characteristics:  Productive Sputum characteristics:  Green Severity:  Moderate Timing:  Constant Progression:  Unchanged Chronicity:  New Smoker: yes   Relieved by:  Nothing Worsened by:  Nothing tried Ineffective treatments:  None tried Associated symptoms: chills   Associated symptoms: no shortness of breath     Past Medical History  Diagnosis Date  . DVT (deep venous thrombosis)    Past Surgical History  Procedure Laterality Date  . Cesarean section     Family History  Problem Relation Age of Onset  . Diabetes Neg Hx    History  Substance Use Topics  . Smoking status: Current Every Day Smoker    Types: Cigarettes  . Smokeless tobacco: Never Used  . Alcohol Use: No   OB History   Grav Para Term Preterm Abortions TAB SAB Ect Mult Living                 Review of Systems  Constitutional: Positive for chills.  Respiratory: Positive for cough. Negative for shortness of breath.   Cardiovascular: Negative.     Allergies  Review of patient's allergies indicates no known allergies.  Home Medications   Current Outpatient Rx  Name  Route  Sig  Dispense  Refill  . acetaminophen (TYLENOL) 325 MG tablet   Oral   Take 650 mg by mouth every 6 (six) hours as needed. For pain         . amoxicillin (AMOXIL) 500 MG capsule   Oral   Take 1 capsule (500 mg total) by mouth 3 (three) times daily.   21 capsule   0   . ferrous sulfate 325 (65 FE) MG tablet   Oral   Take 1 tablet (325 mg total) by mouth  2 (two) times daily with a meal.   60 tablet   0   . megestrol (MEGACE) 40 MG tablet   Oral   Take 1 tablet (40 mg total) by mouth daily.   30 tablet   3   . Rivaroxaban (XARELTO) 20 MG TABS   Oral   Take 1 tablet (20 mg total) by mouth daily.   30 tablet   5    BP 115/80  Pulse 110  Temp(Src) 99 F (37.2 C) (Oral)  Wt 151 lb (68.493 kg)  BMI 28.55 kg/m2  SpO2 98%  LMP 01/04/2013 Physical Exam  Nursing note and vitals reviewed. Constitutional: She is oriented to person, place, and time. She appears well-developed and well-nourished.  HENT:  Head: Normocephalic and atraumatic.  Nose: Rhinorrhea present.  Mouth/Throat: Posterior oropharyngeal edema, posterior oropharyngeal erythema and tonsillar abscesses present.  Eyes: Conjunctivae and EOM are normal. Pupils are equal, round, and reactive to light.  Neck: Normal range of motion. Neck supple.  Cardiovascular: Normal rate and regular rhythm.   Pulmonary/Chest: Effort normal.  Musculoskeletal: Normal range of motion.  Neurological: She is alert and oriented to person, place, and time.    ED Course  Procedures (including critical care time) Labs Review Labs Reviewed - No data to display Imaging  Review Dg Chest 2 View  01/08/2013   CLINICAL DATA:  Productive cough, chills, congestion.  EXAM: CHEST  2 VIEW  COMPARISON:  02/17/2009  FINDINGS: The heart size and mediastinal contours are within normal limits. Both lungs are clear. The visualized skeletal structures are unremarkable.  IMPRESSION: No active cardiopulmonary disease.   Electronically Signed   By: Charlett Nose M.D.   On: 01/08/2013 17:10    EKG Interpretation   None       MDM   1. Viral illness     Pts vitals are stable:will treat symptomatically:no pneumonia noted on x-ray  Teressa Lower, NP 01/08/13 1724

## 2013-01-08 NOTE — ED Notes (Signed)
Pt reports generalized weakness, productive cough and chills since yesterday. Pt also reports a runny nose.

## 2013-01-09 NOTE — ED Provider Notes (Signed)
Medical screening examination/treatment/procedure(s) were performed by non-physician practitioner and as supervising physician I was immediately available for consultation/collaboration.  Yechiel Erny M Vernetta Dizdarevic, MD 01/09/13 0014 

## 2013-10-26 ENCOUNTER — Encounter (HOSPITAL_COMMUNITY): Payer: Self-pay | Admitting: Emergency Medicine

## 2013-10-26 ENCOUNTER — Emergency Department (HOSPITAL_COMMUNITY)
Admission: EM | Admit: 2013-10-26 | Discharge: 2013-10-26 | Disposition: A | Discharge status: Home / Self Care | Payer: Self-pay | Attending: Emergency Medicine | Admitting: Emergency Medicine

## 2013-10-26 DIAGNOSIS — Z86718 Personal history of other venous thrombosis and embolism: Secondary | ICD-10-CM | POA: Insufficient documentation

## 2013-10-26 DIAGNOSIS — N760 Acute vaginitis: Secondary | ICD-10-CM | POA: Insufficient documentation

## 2013-10-26 DIAGNOSIS — B9689 Other specified bacterial agents as the cause of diseases classified elsewhere: Secondary | ICD-10-CM | POA: Insufficient documentation

## 2013-10-26 DIAGNOSIS — L293 Anogenital pruritus, unspecified: Secondary | ICD-10-CM | POA: Insufficient documentation

## 2013-10-26 DIAGNOSIS — F17200 Nicotine dependence, unspecified, uncomplicated: Secondary | ICD-10-CM | POA: Insufficient documentation

## 2013-10-26 DIAGNOSIS — A499 Bacterial infection, unspecified: Secondary | ICD-10-CM | POA: Insufficient documentation

## 2013-10-26 LAB — URINALYSIS, ROUTINE W REFLEX MICROSCOPIC
Bilirubin Urine: NEGATIVE
GLUCOSE, UA: NEGATIVE mg/dL
Ketones, ur: NEGATIVE mg/dL
LEUKOCYTES UA: NEGATIVE
NITRITE: NEGATIVE
PH: 5.5 (ref 5.0–8.0)
Protein, ur: NEGATIVE mg/dL
Specific Gravity, Urine: 1.03 (ref 1.005–1.030)
Urobilinogen, UA: 1 mg/dL (ref 0.0–1.0)

## 2013-10-26 LAB — WET PREP, GENITAL
Trich, Wet Prep: NONE SEEN
YEAST WET PREP: NONE SEEN

## 2013-10-26 LAB — URINE MICROSCOPIC-ADD ON

## 2013-10-26 MED ORDER — METRONIDAZOLE 500 MG PO TABS: 500.0000 mg | ORAL_TABLET | Freq: Two times a day (BID) | ORAL | Status: DC

## 2013-10-26 NOTE — Discharge Instructions (Signed)
Take Flagyl as directed until gone. Refer to attached documents for more information.

## 2013-10-26 NOTE — ED Notes (Signed)
Pt reports vaginal odor for months; hasnt had intercourse this year. Been deuching. No discharge noted.

## 2013-10-26 NOTE — Progress Notes (Addendum)
[place],  Will send patient information on [facility] and other resources to help patient establish primary care, using the address provided.   Tried to contact patient about resources-received no answer at time of call, CL left no message. Received call back from female who stated that he had a missed call from number. Female was interrupted by female who stated that she was Ms. Sawdey. Started to explain role of Community Liaison in ED, when female on phone stated that she was not discharged from ED, but had been to see "a relative" earlier today. I apologized for misunderstanding and no further information was discussed.

## 2013-10-26 NOTE — ED Notes (Signed)
PT ambulated with baseline gait; VSS; A&Ox3; no signs of distress; respirations even and unlabored; skin warm and dry; no questions upon discharge.  

## 2013-10-26 NOTE — ED Provider Notes (Signed)
CSN: [id number]     Arrival date & time 10/26/13  4580 History   First MD Initiated Contact with Patient 10/26/13 [phone]     Chief Complaint  Patient presents with  . Vaginal Itching     (Consider location/radiation/quality/duration/timing/severity/associated sxs/prior Treatment) Patient is a 48 y.o. female presenting with vaginal discharge. The history is provided by the patient. No language interpreter was used.  Vaginal Discharge Quality:  White and thin Severity:  Moderate Onset quality:  Gradual Duration:  8 months Timing:  Constant Progression:  Unchanged Chronicity:  New Context: spontaneously   Context: not after intercourse, not after urination, not at rest, not during pregnancy, not during urination, not genital trauma and not recent antibiotic use   Context comment:  Patient has been douching  Relieved by:  Nothing Worsened by:  Nothing tried Ineffective treatments:  None tried Associated symptoms: no abdominal pain, no dysuria, no fever, no nausea and no vomiting   Risk factors: no endometriosis, no foreign body, no gynecological surgery, no new sexual partner, no PID, no STI exposure and no unprotected sex   Risk factors comment:  Patient reports no sexual intercourse for 1 year   Past Medical History  Diagnosis Date  . DVT (deep venous thrombosis)    Past Surgical History  Procedure Laterality Date  . Cesarean section     Family History  Problem Relation Age of Onset  . Diabetes Neg Hx    History  Substance Use Topics  . Smoking status: Current Every Day Smoker    Types: Cigarettes  . Smokeless tobacco: Never Used  . Alcohol Use: No   OB History   Grav Para Term Preterm Abortions TAB SAB Ect Mult Living                 Review of Systems  Constitutional: Negative for fever, chills and fatigue.  HENT: Negative for trouble swallowing.   Eyes: Negative for visual disturbance.  Respiratory: Negative for shortness of breath.   Cardiovascular: Negative  for chest pain and palpitations.  Gastrointestinal: Negative for nausea, vomiting, abdominal pain and diarrhea.  Genitourinary: Positive for vaginal discharge. Negative for dysuria and difficulty urinating.       Vaginal odor  Musculoskeletal: Negative for arthralgias and neck pain.  Skin: Negative for color change.  Neurological: Negative for dizziness and weakness.  Psychiatric/Behavioral: Negative for dysphoric mood.      Allergies  Review of patient's allergies indicates no known allergies.  Home Medications   Prior to Admission medications   Not on File   BP 132/86  Pulse 98  Temp(Src) 98.6 F (37 C) (Oral)  Resp 18  SpO2 100%  LMP 10/18/2013 Physical Exam  Nursing note and vitals reviewed. Constitutional: She is oriented to person, place, and time. She appears well-developed and well-nourished. No distress.  HENT:  Head: Normocephalic and atraumatic.  Eyes: Conjunctivae and EOM are normal.  Neck: Normal range of motion.  Cardiovascular: Normal rate and regular rhythm.  Exam reveals no gallop and no friction rub.   No murmur heard. Pulmonary/Chest: Effort normal and breath sounds normal. She has no wheezes. She has no rales. She exhibits no tenderness.  Abdominal: Soft. She exhibits no distension. There is no tenderness. There is no rebound.  Genitourinary: Vagina normal.  Scant amount of white vaginal discharge. Cervical os closed. No CMT.   Musculoskeletal: Normal range of motion.  Neurological: She is alert and oriented to person, place, and time.  Speech is goal-oriented. Moves limbs  without ataxia.   Skin: Skin is warm and dry.  Psychiatric: She has a normal mood and affect. Her behavior is normal.    ED Course  Procedures (including critical care time) Labs Review Labs Reviewed  WET PREP, GENITAL - Abnormal; Notable for the following:    Clue Cells Wet Prep HPF POC FEW (*)    WBC, Wet Prep HPF POC FEW (*)    All other components within normal limits   URINALYSIS, ROUTINE W REFLEX MICROSCOPIC - Abnormal; Notable for the following:    APPearance CLOUDY (*)    Hgb urine dipstick SMALL (*)    All other components within normal limits  URINE MICROSCOPIC-ADD ON - Abnormal; Notable for the following:    Squamous Epithelial / LPF FEW (*)    Bacteria, UA FEW (*)    All other components within normal limits  GC/CHLAMYDIA PROBE AMP    Imaging Review No results found.   EKG Interpretation None      MDM   Final diagnoses:  BV (bacterial vaginosis)    9:26 AM Pelvic exam done. Wet prep, GC/chlamydia, and urinalysis pending. Vitals stable and patient afebrile.   10:42 AM Patient's wet prep shows BV. Patient will be treated with Flagyl. Vitals stable and patient afebrile. No further evaluation needed at this time.   Alvina Chou, [place] 10/27/13 [phone]

## 2013-10-27 LAB — GC/CHLAMYDIA PROBE AMP
CT Probe RNA: NEGATIVE
GC Probe RNA: NEGATIVE

## 2013-10-27 NOTE — ED Provider Notes (Signed)
Medical screening examination/treatment/procedure(s) were performed by non-physician practitioner and as supervising physician I was immediately available for consultation/collaboration.   EKG Interpretation None        Houston Siren III, MD 10/27/13 2137

## 2013-10-30 ENCOUNTER — Emergency Department (HOSPITAL_COMMUNITY): Payer: Self-pay

## 2013-10-30 ENCOUNTER — Emergency Department (HOSPITAL_COMMUNITY)
Admission: EM | Admit: 2013-10-30 | Discharge: 2013-10-31 | Disposition: A | Discharge status: Home / Self Care | Payer: Self-pay | Attending: Emergency Medicine | Admitting: Emergency Medicine

## 2013-10-30 ENCOUNTER — Encounter (HOSPITAL_COMMUNITY): Payer: Self-pay | Admitting: Emergency Medicine

## 2013-10-30 DIAGNOSIS — R51 Headache: Secondary | ICD-10-CM | POA: Insufficient documentation

## 2013-10-30 DIAGNOSIS — Z792 Long term (current) use of antibiotics: Secondary | ICD-10-CM | POA: Insufficient documentation

## 2013-10-30 DIAGNOSIS — R519 Headache, unspecified: Secondary | ICD-10-CM

## 2013-10-30 DIAGNOSIS — R5381 Other malaise: Secondary | ICD-10-CM | POA: Insufficient documentation

## 2013-10-30 DIAGNOSIS — Z86718 Personal history of other venous thrombosis and embolism: Secondary | ICD-10-CM | POA: Insufficient documentation

## 2013-10-30 DIAGNOSIS — M546 Pain in thoracic spine: Secondary | ICD-10-CM | POA: Insufficient documentation

## 2013-10-30 DIAGNOSIS — R42 Dizziness and giddiness: Secondary | ICD-10-CM | POA: Insufficient documentation

## 2013-10-30 DIAGNOSIS — F17200 Nicotine dependence, unspecified, uncomplicated: Secondary | ICD-10-CM | POA: Insufficient documentation

## 2013-10-30 DIAGNOSIS — R5383 Other fatigue: Secondary | ICD-10-CM

## 2013-10-30 LAB — CBC WITH DIFFERENTIAL/PLATELET
BASOS ABS: 0 10*3/uL (ref 0.0–0.1)
Basophils Relative: 0 % (ref 0–1)
EOS PCT: 2 % (ref 0–5)
Eosinophils Absolute: 0.1 10*3/uL (ref 0.0–0.7)
HEMATOCRIT: 36.5 % (ref 36.0–46.0)
Hemoglobin: 11.9 g/dL — ABNORMAL LOW (ref 12.0–15.0)
LYMPHS ABS: 2.4 10*3/uL (ref 0.7–4.0)
LYMPHS PCT: 33 % (ref 12–46)
MCH: 25.5 pg — ABNORMAL LOW (ref 26.0–34.0)
MCHC: 32.6 g/dL (ref 30.0–36.0)
MCV: 78.3 fL (ref 78.0–100.0)
Monocytes Absolute: 0.5 10*3/uL (ref 0.1–1.0)
Monocytes Relative: 7 % (ref 3–12)
NEUTROS ABS: 4.3 10*3/uL (ref 1.7–7.7)
Neutrophils Relative %: 58 % (ref 43–77)
PLATELETS: 340 10*3/uL (ref 150–400)
RBC: 4.66 MIL/uL (ref 3.87–5.11)
RDW: 15.8 % — ABNORMAL HIGH (ref 11.5–15.5)
WBC: 7.3 10*3/uL (ref 4.0–10.5)

## 2013-10-30 LAB — COMPREHENSIVE METABOLIC PANEL
ALK PHOS: 99 U/L (ref 39–117)
ALT: 21 U/L (ref 0–35)
AST: 29 U/L (ref 0–37)
Albumin: 3.7 g/dL (ref 3.5–5.2)
Anion gap: 15 (ref 5–15)
BUN: 7 mg/dL (ref 6–23)
CALCIUM: 9 mg/dL (ref 8.4–10.5)
CHLORIDE: 102 meq/L (ref 96–112)
CO2: 24 meq/L (ref 19–32)
Creatinine, Ser: 0.92 mg/dL (ref 0.50–1.10)
GFR, EST AFRICAN AMERICAN: 85 mL/min — AB (ref >90)
GFR, EST NON AFRICAN AMERICAN: 74 mL/min — AB (ref >90)
GLUCOSE: 109 mg/dL — AB (ref 70–99)
POTASSIUM: 3.7 meq/L (ref 3.7–5.3)
SODIUM: 141 meq/L (ref 137–147)
Total Protein: 7.2 g/dL (ref 6.0–8.3)

## 2013-10-30 LAB — PREGNANCY, URINE: PREG TEST UR: NEGATIVE

## 2013-10-30 LAB — URINALYSIS, ROUTINE W REFLEX MICROSCOPIC
BILIRUBIN URINE: NEGATIVE
GLUCOSE, UA: NEGATIVE mg/dL
HGB URINE DIPSTICK: NEGATIVE
KETONES UR: NEGATIVE mg/dL
Leukocytes, UA: NEGATIVE
Nitrite: NEGATIVE
PROTEIN: NEGATIVE mg/dL
Specific Gravity, Urine: 1.015 (ref 1.005–1.030)
UROBILINOGEN UA: 0.2 mg/dL (ref 0.0–1.0)
pH: 6 (ref 5.0–8.0)

## 2013-10-30 MED ORDER — METOCLOPRAMIDE HCL 5 MG/ML IJ SOLN
10.0000 mg | Freq: Once | INTRAMUSCULAR | Status: AC
  Administered 2013-10-30: 10 mg via INTRAVENOUS
  Filled 2013-10-30: qty 2

## 2013-10-30 MED ORDER — SODIUM CHLORIDE 0.9 % IV BOLUS (SEPSIS)
1000.0000 mL | Freq: Once | INTRAVENOUS | Status: AC
  Administered 2013-10-30: 1000 mL via INTRAVENOUS

## 2013-10-30 MED ORDER — KETOROLAC TROMETHAMINE 30 MG/ML IJ SOLN
30.0000 mg | Freq: Once | INTRAMUSCULAR | Status: AC
  Administered 2013-10-30: 30 mg via INTRAVENOUS
  Filled 2013-10-30: qty 1

## 2013-10-30 MED ORDER — DIPHENHYDRAMINE HCL 50 MG/ML IJ SOLN
25.0000 mg | Freq: Once | INTRAMUSCULAR | Status: AC
  Administered 2013-10-30: 25 mg via INTRAVENOUS
  Filled 2013-10-30: qty 1

## 2013-10-30 NOTE — ED Notes (Signed)
Pt. reports headache for 1 week unrelieved by OTC pain medications , denies injury / no nausea or v omitting .

## 2013-10-30 NOTE — ED Provider Notes (Signed)
CSN: [id number]     Arrival date & time 10/30/13  1956 History   First MD Initiated Contact with Patient 10/30/13 2142     Chief Complaint  Patient presents with  . Headache     (Consider location/radiation/quality/duration/timing/severity/associated sxs/prior Treatment) HPI  Amanda Tran is a 48 y.o. female history of DVT presenting for multiple complaints. Patient endorses having intermittent headaches, associated with light sensitivity. She's tried over-the-counter medications without relief. Patient also has been checking her blood pressure in very settings, and is concerned that her blood pressure may be too high or too low at times. She endorses feeling dizzy and lightheaded at times, possibly associated with dehydration. Patient has a past history of anemia, secondary to dysfunctional uterine bleeding. She is a smoker, and was previously treated with hormone therapy for her dysfunctional uterine bleeding however subsequently developed a DVT, which resolved with anticoagulation. However patient is no longer on any hormones as she is still smoking. Patient states her periods have continued to be heavy but have been stable. She states at one time she was on iron pills due to her anemia. Patient denies any chest pain, palpitations, loss of consciousness, shortness of breath, diaphoresis, abdominal pain. Does endorse generalized weakness intermittently, fatigue, and feeling "faint" at times.     Past Medical History  Diagnosis Date  . DVT (deep venous thrombosis)    Past Surgical History  Procedure Laterality Date  . Cesarean section     Family History  Problem Relation Age of Onset  . Diabetes Neg Hx    History  Substance Use Topics  . Smoking status: Current Every Day Smoker    Types: Cigarettes  . Smokeless tobacco: Never Used  . Alcohol Use: No   OB History   Grav Para Term Preterm Abortions TAB SAB Ect Mult Living                 Review of Systems   Constitutional: Positive for fatigue.  Eyes: Negative.   Respiratory: Negative.   Cardiovascular: Negative.   Gastrointestinal: Negative.   Endocrine: Negative.   Genitourinary: Negative.   Musculoskeletal: Negative.   Skin: Negative.   Allergic/Immunologic: Negative.   Neurological: Positive for dizziness, weakness, light-headedness and headaches.  Hematological: Negative.   Psychiatric/Behavioral: Negative.       Allergies  Review of patient's allergies indicates no known allergies.  Home Medications   Prior to Admission medications   Medication Sig Start Date End Date Taking? Authorizing Provider  metroNIDAZOLE (FLAGYL) 500 MG tablet Take 1 tablet (500 mg total) by mouth 2 (two) times daily. 10/26/13  Yes Kaitlyn Szekalski, PA-C   BP 135/94  Pulse 84  Temp(Src) 98 F (36.7 C) (Oral)  Resp 16  SpO2 100%  LMP 10/18/2013 Physical Exam  Nursing note and vitals reviewed. Constitutional: She is oriented to person, place, and time. She appears well-developed and well-nourished. No distress.  HENT:  Head: Normocephalic and atraumatic.  Right Ear: External ear normal.  Left Ear: External ear normal.  Nose: Nose normal.  Mouth/Throat: Oropharynx is clear and moist. No oropharyngeal exudate.  Eyes: Conjunctivae and EOM are normal. Pupils are equal, round, and reactive to light. Right eye exhibits no discharge. Left eye exhibits no discharge. No scleral icterus.  Neck: Normal range of motion. Neck supple. No JVD present. No tracheal deviation present. No thyromegaly present.  Cardiovascular: Normal rate, regular rhythm, normal heart sounds and intact distal pulses.  Exam reveals no gallop and no friction rub.  No murmur heard. Pulmonary/Chest: Effort normal and breath sounds normal. No stridor. No respiratory distress. She has no wheezes. She has no rales. She exhibits no tenderness.  Abdominal: Soft. She exhibits no distension. There is no tenderness. There is no rebound and  no guarding.  Musculoskeletal: Normal range of motion. She exhibits no edema and no tenderness.  Lymphadenopathy:    She has no cervical adenopathy.  Neurological: She is alert and oriented to person, place, and time. She has normal strength and normal reflexes. She is not disoriented. She displays no atrophy, no tremor and normal reflexes. No cranial nerve deficit or sensory deficit. She exhibits normal muscle tone. She displays no seizure activity. GCS eye subscore is 4. GCS verbal subscore is 5. GCS motor subscore is 6.  Skin: Skin is warm and dry. No rash noted. She is not diaphoretic. No erythema. No pallor.  Psychiatric: She has a normal mood and affect. Her behavior is normal. Judgment and thought content normal.    ED Course  Procedures (including critical care time) Labs Review Labs Reviewed  CBC WITH DIFFERENTIAL - Abnormal; Notable for the following:    Hemoglobin 11.9 (*)    MCH 25.5 (*)    RDW 15.8 (*)    All other components within normal limits  COMPREHENSIVE METABOLIC PANEL - Abnormal; Notable for the following:    Glucose, Bld 109 (*)    Total Bilirubin <0.2 (*)    GFR calc non Af Amer 74 (*)    GFR calc Af Amer 85 (*)    All other components within normal limits  URINALYSIS, ROUTINE W REFLEX MICROSCOPIC - Abnormal; Notable for the following:    APPearance HAZY (*)    All other components within normal limits  PREGNANCY, URINE    Imaging Review Dg Chest 2 View  10/31/2013   CLINICAL DATA:  Dizziness and syncope.  EXAM: CHEST  2 VIEW  COMPARISON:  Chest radiograph performed 01/08/2013  FINDINGS: The lungs are well-aerated and clear. There is no evidence of focal opacification, pleural effusion or pneumothorax.  The heart is normal in size; the mediastinal contour is within normal limits. No acute osseous abnormalities are seen. Clips are noted within the right upper quadrant, reflecting prior cholecystectomy.  IMPRESSION: No acute cardiopulmonary process seen.    Electronically Signed   By: Garald Balding M.D.   On: 10/31/2013 00:20     EKG Interpretation   Date/Time:  Saturday October 30 2013 22:56:32 EDT Ventricular Rate:  72 PR Interval:  158 QRS Duration: 78 QT Interval:  393 QTC Calculation: 430 R Axis:   43 Text Interpretation:  Sinus rhythm Borderline T abnormalities, anterior  leads No previous ECGs available Confirmed by YAO  MD, DAVID ([id number]) on  10/30/2013 11:02:31 PM      MDM   Final diagnoses:  Dizziness  Generalized headache    Patient has symptoms which could be part of multiple etiologies. Vital signs stable, no focal neurologic deficits. She has components of symptomatic anemia. Possible intermittent mild hypertension. Possible dehydration. Also possibly migraine headaches; however, atypical. Would be most concerned for undiagnosed cardiac disease. Patient has not been told she has high blood pressure the past, and she has been measuring her blood pressures differently in variable settings, which may be leading to a running his measurements, and therefore unnecessary anxiety. Will check for gross cardiac abnormalities, EKG changes and laboratory abnormalities. Will treat her symptoms with fluids and antiemetics with migraine activity.  Patient's workup was unremarkable  including EKG, chest x-ray, and labs. Patient states she feels better following a migraine cocktail of normal saline, Benadryl, Reglan, Toradol. Have advised the patient to follow up with her primary care physician for further recommendations regarding her blood pressure issues, and other concerns. No other acute issues appear necessary to be addressed at this time. Patient agrees and will plan on followup as discussed.  Patient and family given return precautions for dizziness.  Advised to return for worsening symptoms including chest pain, shortness of breath, severe headache, intractable nausea or vomiting, fever, or chills, inability to take medications, or other  acute concerns.  Advised to follow up with PCP in 1 week.  Patient and family in agreement with and expressed understanding of follow plan, plan of care, and return precautions.  All questions answered prior to discharge.  Patient was discharged in stable condition with family, ambulating without difficulty.   Patient care was discussed with my attending, Dr. Darl Householder.   Hoyle Sauer, MD 10/31/13 [phone]

## 2013-10-31 NOTE — ED Notes (Signed)
Patient declined wheelchair. RN escorted to exit

## 2013-10-31 NOTE — ED Provider Notes (Signed)
I saw and evaluated the patient, reviewed the resident's note and I agree with the findings and plan.   EKG Interpretation   Date/Time:  Saturday October 30 2013 22:56:32 EDT Ventricular Rate:  72 PR Interval:  158 QRS Duration: 78 QT Interval:  393 QTC Calculation: 430 R Axis:   43 Text Interpretation:  Sinus rhythm Borderline T abnormalities, anterior  leads No previous ECGs available Confirmed by Ki Corbo  MD, Lizania Bouchard ([id number]) on  10/30/2013 11:02:31 PM      Amanda Tran is a 48 y.o. female hx of DVT here with lightheadedness, headaches, dizziness. Lightheaded for a week. Feels dizzy. No actual syncope. On exam, neuro exam unremarkable. Cardiac and lung exam nl. Labs unremarkable. Given migraine cocktail, felt better, will d/c home.    Wandra Arthurs, MD 10/31/13 [phone]

## 2014-03-23 ENCOUNTER — Encounter: Payer: Self-pay | Admitting: Internal Medicine

## 2014-03-23 ENCOUNTER — Ambulatory Visit: Payer: Self-pay | Attending: Internal Medicine | Admitting: Internal Medicine

## 2014-03-23 VITALS — BP 124/88 | HR 88 | Temp 98.0°F | Resp 16 | Wt 177.2 lb

## 2014-03-23 DIAGNOSIS — Z86718 Personal history of other venous thrombosis and embolism: Secondary | ICD-10-CM | POA: Insufficient documentation

## 2014-03-23 DIAGNOSIS — Z139 Encounter for screening, unspecified: Secondary | ICD-10-CM

## 2014-03-23 DIAGNOSIS — Z833 Family history of diabetes mellitus: Secondary | ICD-10-CM | POA: Insufficient documentation

## 2014-03-23 DIAGNOSIS — Z720 Tobacco use: Secondary | ICD-10-CM

## 2014-03-23 DIAGNOSIS — Z0000 Encounter for general adult medical examination without abnormal findings: Secondary | ICD-10-CM | POA: Insufficient documentation

## 2014-03-23 DIAGNOSIS — F17210 Nicotine dependence, cigarettes, uncomplicated: Secondary | ICD-10-CM | POA: Insufficient documentation

## 2014-03-23 DIAGNOSIS — Z23 Encounter for immunization: Secondary | ICD-10-CM | POA: Insufficient documentation

## 2014-03-23 DIAGNOSIS — F17200 Nicotine dependence, unspecified, uncomplicated: Secondary | ICD-10-CM | POA: Insufficient documentation

## 2014-03-23 LAB — COMPLETE METABOLIC PANEL WITH GFR
ALBUMIN: 4 g/dL (ref 3.5–5.2)
ALT: 33 U/L (ref 0–35)
AST: 26 U/L (ref 0–37)
Alkaline Phosphatase: 95 U/L (ref 39–117)
BUN: 9 mg/dL (ref 6–23)
CALCIUM: 9.1 mg/dL (ref 8.4–10.5)
CO2: 27 mEq/L (ref 19–32)
CREATININE: 0.86 mg/dL (ref 0.50–1.10)
Chloride: 108 mEq/L (ref 96–112)
GFR, Est Non African American: 81 mL/min
GLUCOSE: 96 mg/dL (ref 70–99)
POTASSIUM: 5.3 meq/L (ref 3.5–5.3)
Sodium: 141 mEq/L (ref 135–145)
TOTAL PROTEIN: 6.9 g/dL (ref 6.0–8.3)
Total Bilirubin: 0.2 mg/dL (ref 0.2–1.2)

## 2014-03-23 LAB — CBC WITH DIFFERENTIAL/PLATELET
Basophils Absolute: 0.1 10*3/uL (ref 0.0–0.1)
Basophils Relative: 1 % (ref 0–1)
Eosinophils Absolute: 0.1 10*3/uL (ref 0.0–0.7)
Eosinophils Relative: 2 % (ref 0–5)
HEMATOCRIT: 36.8 % (ref 36.0–46.0)
HEMOGLOBIN: 11.7 g/dL — AB (ref 12.0–15.0)
Lymphocytes Relative: 33 % (ref 12–46)
Lymphs Abs: 1.8 10*3/uL (ref 0.7–4.0)
MCH: 24.3 pg — AB (ref 26.0–34.0)
MCHC: 31.8 g/dL (ref 30.0–36.0)
MCV: 76.3 fL — AB (ref 78.0–100.0)
MONOS PCT: 7 % (ref 3–12)
MPV: 8.8 fL (ref 8.6–12.4)
Monocytes Absolute: 0.4 10*3/uL (ref 0.1–1.0)
Neutro Abs: 3.2 10*3/uL (ref 1.7–7.7)
Neutrophils Relative %: 57 % (ref 43–77)
Platelets: 369 10*3/uL (ref 150–400)
RBC: 4.82 MIL/uL (ref 3.87–5.11)
RDW: 17.2 % — AB (ref 11.5–15.5)
WBC: 5.6 10*3/uL (ref 4.0–10.5)

## 2014-03-23 LAB — HEMOGLOBIN A1C
HEMOGLOBIN A1C: 6.1 % — AB (ref <5.7)
Mean Plasma Glucose: 128 mg/dL — ABNORMAL HIGH (ref <117)

## 2014-03-23 LAB — LIPID PANEL
CHOL/HDL RATIO: 4.9 ratio
CHOLESTEROL: 182 mg/dL (ref 0–200)
HDL: 37 mg/dL — AB (ref >39)
LDL Cholesterol: 134 mg/dL — ABNORMAL HIGH (ref 0–99)
Triglycerides: 54 mg/dL (ref <150)
VLDL: 11 mg/dL (ref 0–40)

## 2014-03-23 MED ORDER — VARENICLINE TARTRATE 0.5 MG X 11 & 1 MG X 42 PO MISC: ORAL | Status: DC

## 2014-03-23 NOTE — Progress Notes (Signed)
Patient here for routine physical Takes no prescribed medications

## 2014-03-23 NOTE — Patient Instructions (Signed)
Smoking Cessation Quitting smoking is important to your health and has many advantages. However, it is not always easy to quit since nicotine is a very addictive drug. Oftentimes, people try 3 times or more before being able to quit. This document explains the best ways for you to prepare to quit smoking. Quitting takes hard work and a lot of effort, but you can do it. ADVANTAGES OF QUITTING SMOKING  You will live longer, feel better, and live better.  Your body will feel the impact of quitting smoking almost immediately.  Within 20 minutes, blood pressure decreases. Your pulse returns to its normal level.  After 8 hours, carbon monoxide levels in the blood return to normal. Your oxygen level increases.  After 24 hours, the chance of having a heart attack starts to decrease. Your breath, hair, and body stop smelling like smoke.  After 48 hours, damaged nerve endings begin to recover. Your sense of taste and smell improve.  After 72 hours, the body is virtually free of nicotine. Your bronchial tubes relax and breathing becomes easier.  After 2 to 12 weeks, lungs can hold more air. Exercise becomes easier and circulation improves.  The risk of having a heart attack, stroke, cancer, or lung disease is greatly reduced.  After 1 year, the risk of coronary heart disease is cut in half.  After 5 years, the risk of stroke falls to the same as a nonsmoker.  After 10 years, the risk of lung cancer is cut in half and the risk of other cancers decreases significantly.  After 15 years, the risk of coronary heart disease drops, usually to the level of a nonsmoker.  If you are pregnant, quitting smoking will improve your chances of having a healthy baby.  The people you live with, especially any children, will be healthier.  You will have extra money to spend on things other than cigarettes. QUESTIONS TO THINK ABOUT BEFORE ATTEMPTING TO QUIT You may want to talk about your answers with your  health care provider.  Why do you want to quit?  If you tried to quit in the past, what helped and what did not?  What will be the most difficult situations for you after you quit? How will you plan to handle them?  Who can help you through the tough times? Your family? Friends? A health care provider?  What pleasures do you get from smoking? What ways can you still get pleasure if you quit? Here are some questions to ask your health care provider:  How can you help me to be successful at quitting?  What medicine do you think would be best for me and how should I take it?  What should I do if I need more help?  What is smoking withdrawal like? How can I get information on withdrawal? GET READY  Set a quit date.  Change your environment by getting rid of all cigarettes, ashtrays, matches, and lighters in your home, car, or work. Do not let people smoke in your home.  Review your past attempts to quit. Think about what worked and what did not. GET SUPPORT AND ENCOURAGEMENT You have a better chance of being successful if you have help. You can get support in many ways.  Tell your family, friends, and coworkers that you are going to quit and need their support. Ask them not to smoke around you.  Get individual, group, or telephone counseling and support. Programs are available at local hospitals and health centers. Call   your local health department for information about programs in your area.  Spiritual beliefs and practices may help some smokers quit.  Download a "quit meter" on your computer to keep track of quit statistics, such as how long you have gone without smoking, cigarettes not smoked, and money saved.  Get a self-help book about quitting smoking and staying off tobacco. LEARN NEW SKILLS AND BEHAVIORS  Distract yourself from urges to smoke. Talk to someone, go for a walk, or occupy your time with a task.  Change your normal routine. Take a different route to work.  Drink tea instead of coffee. Eat breakfast in a different place.  Reduce your stress. Take a hot bath, exercise, or read a book.  Plan something enjoyable to do every day. Reward yourself for not smoking.  Explore interactive web-based programs that specialize in helping you quit. GET MEDICINE AND USE IT CORRECTLY Medicines can help you stop smoking and decrease the urge to smoke. Combining medicine with the above behavioral methods and support can greatly increase your chances of successfully quitting smoking.  Nicotine replacement therapy helps deliver nicotine to your body without the negative effects and risks of smoking. Nicotine replacement therapy includes nicotine gum, lozenges, inhalers, nasal sprays, and skin patches. Some may be available over-the-counter and others require a prescription.  Antidepressant medicine helps people abstain from smoking, but how this works is unknown. This medicine is available by prescription.  Nicotinic receptor partial agonist medicine simulates the effect of nicotine in your brain. This medicine is available by prescription. Ask your health care provider for advice about which medicines to use and how to use them based on your health history. Your health care provider will tell you what side effects to look out for if you choose to be on a medicine or therapy. Carefully read the information on the package. Do not use any other product containing nicotine while using a nicotine replacement product.  RELAPSE OR DIFFICULT SITUATIONS Most relapses occur within the first 3 months after quitting. Do not be discouraged if you start smoking again. Remember, most people try several times before finally quitting. You may have symptoms of withdrawal because your body is used to nicotine. You may crave cigarettes, be irritable, feel very hungry, cough often, get headaches, or have difficulty concentrating. The withdrawal symptoms are only temporary. They are strongest  when you first quit, but they will go away within 10-14 days. To reduce the chances of relapse, try to:  Avoid drinking alcohol. Drinking lowers your chances of successfully quitting.  Reduce the amount of caffeine you consume. Once you quit smoking, the amount of caffeine in your body increases and can give you symptoms, such as a rapid heartbeat, sweating, and anxiety.  Avoid smokers because they can make you want to smoke.  Do not let weight gain distract you. Many smokers will gain weight when they quit, usually less than 10 pounds. Eat a healthy diet and stay active. You can always lose the weight gained after you quit.  Find ways to improve your mood other than smoking. FOR MORE INFORMATION  www.smokefree.gov  Document Released: 03/05/2001 Document Revised: 07/26/2013 Document Reviewed: 06/20/2011 [employer] Patient Information 2015 [employer]. This information is not intended to replace advice given to you by your health care provider. Make sure you discuss any questions you have with your health care provider.  

## 2014-03-23 NOTE — Progress Notes (Signed)
Patient Demographics  Amanda Tran, is a 48 y.o. female  FXO:[id number]  AYO:[id number]  DOB - 1965-09-28  CC:  Chief Complaint  Patient presents with  . Annual Exam       HPI: [place] is a 48 y.o. female here today to establish medical care and annual examination.patient reported to have history of DVT in the past and took  blood thinner for 6 months, she reported no family history of blood clots, she reported to have occasional cough denies any chest and shortness of breath wheezing, she does smoke cigarettes, I have advised patient to quit smoking, she's going to try Chantix, she denies any prior history of anxiety or depression. Patient has No headache, No chest pain, No abdominal pain - No Nausea, No new weakness tingling or numbness, No Cough - SOB.  No Known Allergies Past Medical History  Diagnosis Date  . DVT (deep venous thrombosis)    Current Outpatient Prescriptions on File Prior to Visit  Medication Sig Dispense Refill  . metroNIDAZOLE (FLAGYL) 500 MG tablet Take 1 tablet (500 mg total) by mouth 2 (two) times daily. 14 tablet 0   No current facility-administered medications on file prior to visit.   Family History  Problem Relation Age of Onset  . Hypertension Father   . Diabetes Father    History   Social History  . Marital Status: Divorced    Spouse Name: N/A    Number of Children: N/A  . Years of Education: N/A   Occupational History  . Not on file.   Social History Main Topics  . Smoking status: Current Every Day Smoker -- 0.50 packs/day for 25 years    Types: Cigarettes  . Smokeless tobacco: Never Used  . Alcohol Use: No  . Drug Use: No  . Sexual Activity: Not Currently   Other Topics Concern  . Not on file   Social History Narrative    Review of Systems: Constitutional: Negative for fever, chills, diaphoresis, activity change, appetite change and fatigue. HENT: Negative for ear pain, nosebleeds, congestion,  facial swelling, rhinorrhea, neck pain, neck stiffness and ear discharge.  Eyes: Negative for pain, discharge, redness, itching and visual disturbance. Respiratory: Negative for cough, choking, chest tightness, shortness of breath, wheezing and stridor.  Cardiovascular: Negative for chest pain, palpitations and leg swelling. Gastrointestinal: Negative for abdominal distention. Genitourinary: Negative for dysuria, urgency, frequency, hematuria, flank pain, decreased urine volume, difficulty urinating and dyspareunia.  Musculoskeletal: Negative for back pain, joint swelling, arthralgia and gait problem. Neurological: Negative for dizziness, tremors, seizures, syncope, facial asymmetry, speech difficulty, weakness, light-headedness, numbness and headaches.  Hematological: Negative for adenopathy. Does not bruise/bleed easily. Psychiatric/Behavioral: Negative for hallucinations, behavioral problems, confusion, dysphoric mood, decreased concentration and agitation.    Objective:   Filed Vitals:   03/23/14 0907  BP: 124/88  Pulse: 88  Temp: 98 F (36.7 C)  Resp: 16    Physical Exam: Constitutional: Patient appears well-developed and well-nourished. No distress. HENT: Normocephalic, atraumatic, External right and left ear normal. Oropharynx is clear and moist.  Eyes: Conjunctivae and EOM are normal. PERRLA, no scleral icterus. Neck: Normal ROM. Neck supple. No JVD. No tracheal deviation. No thyromegaly. CVS: RRR, S1/S2 +, no murmurs, no gallops, no carotid bruit.  Pulmonary: Effort and breath sounds normal, no stridor, rhonchi, wheezes, rales.  Abdominal: Soft. BS +, no distension, tenderness, rebound or guarding.  Musculoskeletal: Normal range of motion. No edema and no tenderness.  Neuro: Alert. Normal reflexes, muscle  tone coordination. No cranial nerve deficit. Skin: Skin is warm and dry. No rash noted. Not diaphoretic. No erythema. No pallor. Psychiatric: Normal mood and affect.  Behavior, judgment, thought content normal.  Lab Results  Component Value Date   WBC 7.3 10/30/2013   HGB 11.9* 10/30/2013   HCT 36.5 10/30/2013   MCV 78.3 10/30/2013   PLT 340 10/30/2013   Lab Results  Component Value Date   CREATININE 0.92 10/30/2013   BUN 7 10/30/2013   NA 141 10/30/2013   K 3.7 10/30/2013   CL 102 10/30/2013   CO2 24 10/30/2013    Lab Results  Component Value Date   HGBA1C 5.2 04/30/2012   Lipid Panel  No results found for: CHOL, TRIG, HDL, CHOLHDL, VLDL, LDLCALC     Assessment and plan:    2. Annual physical exam Have ordered baseline blood work. - CBC with Differential - COMPLETE METABOLIC PANEL WITH GFR - TSH - Lipid panel - Vit D  25 hydroxy (rtn osteoporosis monitoring)  3. Smoking Prescribed Chantix. - varenicline (CHANTIX STARTING MONTH PAK) 0.5 MG X 11 & 1 MG X 42 tablet; Take one 0.5 mg tablet by mouth once daily for 3 days, then increase to one 0.5 mg tablet twice daily for 4 days, then increase to one 1 mg tablet twice daily.  Dispense: 53 tablet; Refill: 0  4. Family history of diabetes mellitus (DM) Will check hemoglobin A1c. - Hemoglobin A1c  5. Screening  - Ambulatory referral to Gynecology  6. Need for prophylactic vaccination against Streptococcus pneumoniae (pneumococcus) Pneumovax given today.        Health Maintenance  -Pap Smear: referred to GYN -Mammogram: ordered  -Vaccinations:  Flu shot and pneumovax today   Return in about 3 months (around 06/22/2014), or if symptoms worsen or fail to improve.   Lorayne Marek, MD

## 2014-03-24 LAB — VITAMIN D 25 HYDROXY (VIT D DEFICIENCY, FRACTURES): Vit D, 25-Hydroxy: 8 ng/mL — ABNORMAL LOW (ref 30–100)

## 2014-03-24 LAB — TSH: TSH: 1.37 u[IU]/mL (ref 0.350–4.500)

## 2014-03-30 ENCOUNTER — Encounter: Payer: Self-pay | Admitting: Internal Medicine

## 2014-04-04 ENCOUNTER — Telehealth: Payer: Self-pay | Admitting: Internal Medicine

## 2014-04-04 MED ORDER — VITAMIN D (ERGOCALCIFEROL) 1.25 MG (50000 UNIT) PO CAPS: 50000.0000 [IU] | ORAL_CAPSULE | ORAL | Status: DC

## 2014-04-04 NOTE — Telephone Encounter (Signed)
Patient calling to request lab results. Transferred to nurse line. Please assist.

## 2014-04-04 NOTE — Telephone Encounter (Signed)
Pt aware of lab results Rx Ergocalciferol was e-scribe to [facility]

## 2014-04-04 NOTE — Telephone Encounter (Signed)
-----   Message from Lorayne Marek, MD sent at 03/24/2014  9:19 AM EST ----- Blood work reviewed, noticed low vitamin D, call patient advise to start ergocalciferol 50,000 units once a week for the duration of  12 weeks. Blood work reviewed noticed hemoglobin A1c of 6.1%, patient has prediabetes, call and advise patient for low carbohydrate diet. Patient also has borderline anemia, advise patient to take iron supplement daily.

## 2014-05-31 ENCOUNTER — Ambulatory Visit: Payer: Self-pay | Admitting: Internal Medicine

## 2014-06-26 ENCOUNTER — Emergency Department (HOSPITAL_COMMUNITY): Payer: Self-pay

## 2014-06-26 ENCOUNTER — Encounter (HOSPITAL_COMMUNITY): Payer: Self-pay

## 2014-06-26 ENCOUNTER — Emergency Department (HOSPITAL_COMMUNITY)
Admission: EM | Admit: 2014-06-26 | Discharge: 2014-06-26 | Disposition: A | Discharge status: Home / Self Care | Payer: Self-pay | Attending: Emergency Medicine | Admitting: Emergency Medicine

## 2014-06-26 DIAGNOSIS — R109 Unspecified abdominal pain: Secondary | ICD-10-CM | POA: Insufficient documentation

## 2014-06-26 DIAGNOSIS — Z86718 Personal history of other venous thrombosis and embolism: Secondary | ICD-10-CM | POA: Insufficient documentation

## 2014-06-26 DIAGNOSIS — Z792 Long term (current) use of antibiotics: Secondary | ICD-10-CM | POA: Insufficient documentation

## 2014-06-26 DIAGNOSIS — J069 Acute upper respiratory infection, unspecified: Secondary | ICD-10-CM | POA: Insufficient documentation

## 2014-06-26 DIAGNOSIS — R000 Tachycardia, unspecified: Secondary | ICD-10-CM | POA: Insufficient documentation

## 2014-06-26 DIAGNOSIS — Z79899 Other long term (current) drug therapy: Secondary | ICD-10-CM | POA: Insufficient documentation

## 2014-06-26 DIAGNOSIS — Z720 Tobacco use: Secondary | ICD-10-CM | POA: Insufficient documentation

## 2014-06-26 MED ORDER — ALBUTEROL SULFATE HFA 108 (90 BASE) MCG/ACT IN AERS
2.0000 | INHALATION_SPRAY | Freq: Once | RESPIRATORY_TRACT | Status: AC
  Administered 2014-06-26: 2 via RESPIRATORY_TRACT
  Filled 2014-06-26: qty 6.7

## 2014-06-26 MED ORDER — IBUPROFEN 800 MG PO TABS: 800.0000 mg | ORAL_TABLET | Freq: Three times a day (TID) | ORAL | Status: DC | PRN

## 2014-06-26 MED ORDER — HYDROCOD POLST-CHLORPHEN POLST 10-8 MG/5ML PO LQCR
5.0000 mL | Freq: Two times a day (BID) | ORAL | Status: DC | PRN
Start: 2014-06-26 — End: 2014-12-21

## 2014-06-26 NOTE — ED Provider Notes (Signed)
CSN: [id number]     Arrival date & time 06/26/14  1112 History  This chart was scribed for non-physician practitioner Clayton Bibles, PA-C, working with Davonna Belling, MD by Zola Button, ED Scribe. This patient was seen in room TR04C/TR04C and the patient's care was started at 11:56 AM.      Chief Complaint  Patient presents with  . URI   HPI HPI Comments: [name] is a 49 y.o. female who presents to the Emergency Department complaining of gradual onset URI symptoms that started 2 days ago. Patient reports having productive cough of brownish/greenish sputum, wheeze, generalized myalgias, congestion, and sore throat. She notes having pain to her chest, abdomen and throat when coughing. Her symptoms are worse at night and in the morning, and her cough does keep her up at night. She has tried Alka-Seltzer cold & flu and OTC cold medications for her symptoms. She notes that her son has had a sore throat. Patient denies SOB. She also denies having an inhaler at home.   Past Medical History  Diagnosis Date  . DVT (deep venous thrombosis)    Past Surgical History  Procedure Laterality Date  . Cesarean section     Family History  Problem Relation Age of Onset  . Hypertension Father   . Diabetes Father    History  Substance Use Topics  . Smoking status: Current Every Day Smoker -- 0.50 packs/day for 25 years    Types: Cigarettes  . Smokeless tobacco: Never Used  . Alcohol Use: No   OB History    No data available     Review of Systems  Constitutional: Negative for fever and chills.  HENT: Positive for congestion, rhinorrhea and sore throat.   Eyes: Positive for discharge.  Respiratory: Positive for cough and wheezing. Negative for shortness of breath.   Cardiovascular: Positive for chest pain.  Gastrointestinal: Positive for abdominal pain.  Musculoskeletal: Positive for myalgias.  Allergic/Immunologic: Negative for immunocompromised state.  Hematological: Does not  bruise/bleed easily.      Allergies  Review of patient's allergies indicates no known allergies.  Home Medications   Prior to Admission medications   Medication Sig Start Date End Date Taking? Authorizing Provider  metroNIDAZOLE (FLAGYL) 500 MG tablet Take 1 tablet (500 mg total) by mouth 2 (two) times daily. 10/26/13   Alvina Chou, PA-C  varenicline (CHANTIX STARTING MONTH PAK) 0.5 MG X 11 & 1 MG X 42 tablet Take one 0.5 mg tablet by mouth once daily for 3 days, then increase to one 0.5 mg tablet twice daily for 4 days, then increase to one 1 mg tablet twice daily. 03/23/14   Lorayne Marek, MD  Vitamin D, Ergocalciferol, (DRISDOL) 50000 UNITS CAPS capsule Take 1 capsule (50,000 Units total) by mouth every 7 (seven) days. 04/04/14   Lorayne Marek, MD   BP 142/82 mmHg  Pulse 91  Temp(Src) 98.4 F (36.9 C) (Oral)  Resp 18  SpO2 97%  LMP 06/26/2014 Physical Exam  Constitutional: She appears well-developed and well-nourished. No distress.  HENT:  Head: Normocephalic and atraumatic.  Mouth/Throat: Oropharynx is clear and moist. No oropharyngeal exudate.  Eyes: Conjunctivae are normal.  Neck: Neck supple.  Cardiovascular: Regular rhythm.  Tachycardia present.   Pulmonary/Chest: Effort normal. No respiratory distress. She has wheezes. She has no rales.  Neurological: She is alert.  Skin: She is not diaphoretic.  Nursing note and vitals reviewed.   ED Course  Procedures  DIAGNOSTIC STUDIES: Oxygen Saturation is 97% on  room air, normal by my interpretation.    COORDINATION OF CARE: 12:01 PM-Discussed treatment plan which includes inhaler and CXR with pt at bedside and pt agreed to plan.    Labs Review Labs Reviewed - No data to display  Imaging Review Dg Chest 2 View  06/26/2014   CLINICAL DATA:  Chest congestion. Productive cough. Chills for 3 days.  EXAM: CHEST  2 VIEW  COMPARISON:  10/30/2013  FINDINGS: A minimal pectus excavatum deformity. Cholecystectomy clips.  Midline trachea. Normal heart size and mediastinal contours. No pleural effusion or pneumothorax. Clear lungs.  IMPRESSION: No acute cardiopulmonary disease.   Electronically Signed   By: Abigail Miyamoto M.D.   On: 06/26/2014 13:01     EKG Interpretation None      MDM   Final diagnoses:  URI (upper respiratory infection)    Afebrile, nontoxic patient with constellation of symptoms suggestive of viral syndrome.  No concerning findings on exam.  Discharged home with supportive care, PCP follow up.  Discussed result, findings, treatment, and follow up  with patient.  Pt given return precautions.  Pt verbalizes understanding and agrees with plan.      I personally performed the services described in this documentation, which was scribed in my presence. The recorded information has been reviewed and is accurate.    Clayton Bibles, PA-C 06/26/14 [place], MD 06/29/14 [phone]

## 2014-06-26 NOTE — Discharge Instructions (Signed)
Read the information below.  Use the prescribed medication as directed.  Please discuss all new medications with your pharmacist.  You may return to the Emergency Department at any time for worsening condition or any new symptoms that concern you.  If you develop high fevers that do not resolve with tylenol or ibuprofen, you have difficulty swallowing or breathing, or you are unable to tolerate fluids by mouth, return to the ER for a recheck.      Upper Respiratory Infection, Adult An upper respiratory infection (URI) is also sometimes known as the common cold. The upper respiratory tract includes the nose, sinuses, throat, trachea, and bronchi. Bronchi are the airways leading to the lungs. Most people improve within 1 week, but symptoms can last up to 2 weeks. A residual cough may last even longer.  CAUSES Many different viruses can infect the tissues lining the upper respiratory tract. The tissues become irritated and inflamed and often become very moist. Mucus production is also common. A cold is contagious. You can easily spread the virus to others by oral contact. This includes kissing, sharing a glass, coughing, or sneezing. Touching your mouth or nose and then touching a surface, which is then touched by another person, can also spread the virus. SYMPTOMS  Symptoms typically develop 1 to 3 days after you come in contact with a cold virus. Symptoms vary from person to person. They may include:  Runny nose.  Sneezing.  Nasal congestion.  Sinus irritation.  Sore throat.  Loss of voice (laryngitis).  Cough.  Fatigue.  Muscle aches.  Loss of appetite.  Headache.  Low-grade fever. DIAGNOSIS  You might diagnose your own cold based on familiar symptoms, since most people get a cold 2 to 3 times a year. Your caregiver can confirm this based on your exam. Most importantly, your caregiver can check that your symptoms are not due to another disease such as strep throat, sinusitis,  pneumonia, asthma, or epiglottitis. Blood tests, throat tests, and X-rays are not necessary to diagnose a common cold, but they may sometimes be helpful in excluding other more serious diseases. Your caregiver will decide if any further tests are required. RISKS AND COMPLICATIONS  You may be at risk for a more severe case of the common cold if you smoke cigarettes, have chronic heart disease (such as heart failure) or lung disease (such as asthma), or if you have a weakened immune system. The very young and very old are also at risk for more serious infections. Bacterial sinusitis, middle ear infections, and bacterial pneumonia can complicate the common cold. The common cold can worsen asthma and chronic obstructive pulmonary disease (COPD). Sometimes, these complications can require emergency medical care and may be life-threatening. PREVENTION  The best way to protect against getting a cold is to practice good hygiene. Avoid oral or hand contact with people with cold symptoms. Wash your hands often if contact occurs. There is no clear evidence that vitamin C, vitamin E, echinacea, or exercise reduces the chance of developing a cold. However, it is always recommended to get plenty of rest and practice good nutrition. TREATMENT  Treatment is directed at relieving symptoms. There is no cure. Antibiotics are not effective, because the infection is caused by a virus, not by bacteria. Treatment may include:  Increased fluid intake. Sports drinks offer valuable electrolytes, sugars, and fluids.  Breathing heated mist or steam (vaporizer or shower).  Eating chicken soup or other clear broths, and maintaining good nutrition.  Getting plenty  of rest.  Using gargles or lozenges for comfort.  Controlling fevers with ibuprofen or acetaminophen as directed by your caregiver.  Increasing usage of your inhaler if you have asthma. Zinc gel and zinc lozenges, taken in the first 24 hours of the common cold, can  shorten the duration and lessen the severity of symptoms. Pain medicines may help with fever, muscle aches, and throat pain. A variety of non-prescription medicines are available to treat congestion and runny nose. Your caregiver can make recommendations and may suggest nasal or lung inhalers for other symptoms.  HOME CARE INSTRUCTIONS   Only take over-the-counter or prescription medicines for pain, discomfort, or fever as directed by your caregiver.  Use a warm mist humidifier or inhale steam from a shower to increase air moisture. This may keep secretions moist and make it easier to breathe.  Drink enough water and fluids to keep your urine clear or pale yellow.  Rest as needed.  Return to work when your temperature has returned to normal or as your caregiver advises. You may need to stay home longer to avoid infecting others. You can also use a face mask and careful hand washing to prevent spread of the virus. SEEK MEDICAL CARE IF:   After the first few days, you feel you are getting worse rather than better.  You need your caregiver's advice about medicines to control symptoms.  You develop chills, worsening shortness of breath, or brown or red sputum. These may be signs of pneumonia.  You develop yellow or brown nasal discharge or pain in the face, especially when you bend forward. These may be signs of sinusitis.  You develop a fever, swollen neck glands, pain with swallowing, or white areas in the back of your throat. These may be signs of strep throat. SEEK IMMEDIATE MEDICAL CARE IF:   You have a fever.  You develop severe or persistent headache, ear pain, sinus pain, or chest pain.  You develop wheezing, a prolonged cough, cough up blood, or have a change in your usual mucus (if you have chronic lung disease).  You develop sore muscles or a stiff neck. Document Released: 09/04/2000 Document Revised: 06/03/2011 Document Reviewed: 06/16/2013 [facility] Patient Information 2015  [place], [place]. This information is not intended to replace advice given to you by your health care provider. Make sure you discuss any questions you have with your health care provider.

## 2014-06-26 NOTE — ED Notes (Signed)
Pt reports cold symptoms since Friday with runny nose, tearing eyes, and productive cough. Denies fever/chills. Denies SOB. NAD.

## 2014-12-21 ENCOUNTER — Encounter (HOSPITAL_BASED_OUTPATIENT_CLINIC_OR_DEPARTMENT_OTHER): Payer: Self-pay | Admitting: Clinical

## 2014-12-21 ENCOUNTER — Encounter: Payer: Self-pay | Admitting: Family Medicine

## 2014-12-21 ENCOUNTER — Ambulatory Visit: Payer: Self-pay | Attending: Family Medicine | Admitting: Family Medicine

## 2014-12-21 VITALS — BP 142/90 | HR 88 | Temp 98.7°F | Resp 16 | Ht 61.0 in | Wt 169.0 lb

## 2014-12-21 DIAGNOSIS — D509 Iron deficiency anemia, unspecified: Secondary | ICD-10-CM | POA: Insufficient documentation

## 2014-12-21 DIAGNOSIS — R5383 Other fatigue: Secondary | ICD-10-CM | POA: Insufficient documentation

## 2014-12-21 DIAGNOSIS — A499 Bacterial infection, unspecified: Secondary | ICD-10-CM

## 2014-12-21 DIAGNOSIS — Z114 Encounter for screening for human immunodeficiency virus [HIV]: Secondary | ICD-10-CM

## 2014-12-21 DIAGNOSIS — F17210 Nicotine dependence, cigarettes, uncomplicated: Secondary | ICD-10-CM | POA: Insufficient documentation

## 2014-12-21 DIAGNOSIS — Z86718 Personal history of other venous thrombosis and embolism: Secondary | ICD-10-CM | POA: Insufficient documentation

## 2014-12-21 DIAGNOSIS — Z124 Encounter for screening for malignant neoplasm of cervix: Secondary | ICD-10-CM

## 2014-12-21 DIAGNOSIS — R030 Elevated blood-pressure reading, without diagnosis of hypertension: Secondary | ICD-10-CM | POA: Insufficient documentation

## 2014-12-21 DIAGNOSIS — IMO0001 Reserved for inherently not codable concepts without codable children: Secondary | ICD-10-CM | POA: Insufficient documentation

## 2014-12-21 DIAGNOSIS — R7309 Other abnormal glucose: Secondary | ICD-10-CM | POA: Insufficient documentation

## 2014-12-21 DIAGNOSIS — Z0000 Encounter for general adult medical examination without abnormal findings: Secondary | ICD-10-CM | POA: Insufficient documentation

## 2014-12-21 DIAGNOSIS — J309 Allergic rhinitis, unspecified: Secondary | ICD-10-CM | POA: Insufficient documentation

## 2014-12-21 DIAGNOSIS — E559 Vitamin D deficiency, unspecified: Secondary | ICD-10-CM

## 2014-12-21 DIAGNOSIS — B9689 Other specified bacterial agents as the cause of diseases classified elsewhere: Secondary | ICD-10-CM

## 2014-12-21 DIAGNOSIS — F4323 Adjustment disorder with mixed anxiety and depressed mood: Secondary | ICD-10-CM

## 2014-12-21 DIAGNOSIS — J302 Other seasonal allergic rhinitis: Secondary | ICD-10-CM

## 2014-12-21 DIAGNOSIS — N760 Acute vaginitis: Secondary | ICD-10-CM

## 2014-12-21 LAB — CBC
HCT: 34.6 % — ABNORMAL LOW (ref 36.0–46.0)
HEMOGLOBIN: 10.7 g/dL — AB (ref 12.0–15.0)
MCH: 22.2 pg — AB (ref 26.0–34.0)
MCHC: 30.9 g/dL (ref 30.0–36.0)
MCV: 71.9 fL — ABNORMAL LOW (ref 78.0–100.0)
MPV: 8.4 fL — ABNORMAL LOW (ref 8.6–12.4)
PLATELETS: 412 10*3/uL — AB (ref 150–400)
RBC: 4.81 MIL/uL (ref 3.87–5.11)
RDW: 17.9 % — AB (ref 11.5–15.5)
WBC: 5.9 10*3/uL (ref 4.0–10.5)

## 2014-12-21 LAB — COMPLETE METABOLIC PANEL WITH GFR
ALBUMIN: 4 g/dL (ref 3.6–5.1)
ALK PHOS: 89 U/L (ref 33–115)
ALT: 22 U/L (ref 6–29)
AST: 25 U/L (ref 10–35)
BUN: 6 mg/dL — ABNORMAL LOW (ref 7–25)
CO2: 27 mmol/L (ref 20–31)
Calcium: 9.3 mg/dL (ref 8.6–10.2)
Chloride: 106 mmol/L (ref 98–110)
Creat: 0.78 mg/dL (ref 0.50–1.10)
GFR, Est African American: >89 mL/min (ref >=60)
Glucose, Bld: 92 mg/dL (ref 65–99)
POTASSIUM: 4.9 mmol/L (ref 3.5–5.3)
Sodium: 139 mmol/L (ref 135–146)
Total Bilirubin: 0.3 mg/dL (ref 0.2–1.2)
Total Protein: 7 g/dL (ref 6.1–8.1)

## 2014-12-21 LAB — POCT GLYCOSYLATED HEMOGLOBIN (HGB A1C): Hemoglobin A1C: 5.7

## 2014-12-21 LAB — GLUCOSE, POCT (MANUAL RESULT ENTRY): POC Glucose: 92 mg/dl (ref 70–99)

## 2014-12-21 MED ORDER — CETIRIZINE HCL 10 MG PO TABS: 10.0000 mg | ORAL_TABLET | Freq: Every day | ORAL | Status: DC

## 2014-12-21 NOTE — Progress Notes (Signed)
Subjective:  Patient ID: Amanda Tran, female    DOB: 10-18-1965  Age: 49 y.o. MRN: [id number]  CC: Annual Exam   HPI [place] presents for wellness visit   She reports fatigue, low energy and inability to sleep x for 3 months. She endorses worry about her 70 yr old son who runs the streets and has been in trouble with the law.  Smoker: desires to quit.   Social History  Substance Use Topics  . Smoking status: Current Every Day Smoker -- 0.50 packs/day for 25 years    Types: Cigarettes  . Smokeless tobacco: Never Used  . Alcohol Use: No   Past Medical History  Diagnosis Date  . DVT (deep venous thrombosis) 03/11/2012    Left popliteal    Past Surgical History  Procedure Laterality Date  . Cesarean section      [id number], [id number], 1996   Outpatient Prescriptions Prior to Visit  Medication Sig Dispense Refill  . chlorpheniramine-HYDROcodone (TUSSIONEX PENNKINETIC ER) 10-8 MG/5ML LQCR Take 5 mLs by mouth every 12 (twelve) hours as needed for cough (and pain). (Patient not taking: Reported on 12/21/2014) 100 mL 0  . ibuprofen (ADVIL,MOTRIN) 800 MG tablet Take 1 tablet (800 mg total) by mouth every 8 (eight) hours as needed for mild pain or moderate pain. (Patient not taking: Reported on 12/21/2014) 15 tablet 0  . metroNIDAZOLE (FLAGYL) 500 MG tablet Take 1 tablet (500 mg total) by mouth 2 (two) times daily. (Patient not taking: Reported on 12/21/2014) 14 tablet 0  . varenicline (CHANTIX STARTING MONTH PAK) 0.5 MG X 11 & 1 MG X 42 tablet Take one 0.5 mg tablet by mouth once daily for 3 days, then increase to one 0.5 mg tablet twice daily for 4 days, then increase to one 1 mg tablet twice daily. (Patient not taking: Reported on 12/21/2014) 53 tablet 0  . Vitamin D, Ergocalciferol, (DRISDOL) 50000 UNITS CAPS capsule Take 1 capsule (50,000 Units total) by mouth every 7 (seven) days. (Patient not taking: Reported on 12/21/2014) 12 capsule 0   No facility-administered  medications prior to visit.    ROS Review of Systems  Constitutional: Positive for diaphoresis, activity change, appetite change and fatigue. Negative for fever, chills and unexpected weight change.  HENT: Positive for trouble swallowing.   Eyes: Negative for visual disturbance.  Respiratory: Negative for cough, chest tightness and shortness of breath.   Cardiovascular: Negative for chest pain, palpitations and leg swelling.  Gastrointestinal: Negative for nausea, vomiting, abdominal pain, diarrhea, constipation, blood in stool, abdominal distention, anal bleeding and rectal pain.  Genitourinary: Negative for vaginal bleeding, vaginal discharge, vaginal pain and menstrual problem.       Vaginal odor after menses   Musculoskeletal: Negative for back pain and arthralgias.  Skin: Negative for rash.  Allergic/Immunologic: Negative for immunocompromised state.  Neurological: Positive for headaches.  Hematological: Negative for adenopathy. Does not bruise/bleed easily.  Psychiatric/Behavioral: Positive for sleep disturbance. Negative for suicidal ideas and dysphoric mood. The patient is nervous/anxious.   GAD-7: score of 12. 1-5, 2-2, 3-3,4,7.   Objective:  BP 140/99 mmHg  Pulse 88  Temp(Src) 98.7 F (37.1 C) (Oral)  Resp 16  Ht 5\' 1"  (1.549 m)  Wt 169 lb (76.658 kg)  BMI 31.95 kg/m2  SpO2 98%  LMP 12/08/2014  BP/Weight 12/21/2014 06/26/2014 26/71/2458  Systolic BP 099 833 825  Diastolic BP 99 90 88  Wt. (Lbs) 169 - 177.2  BMI 31.95 - 33.5  Physical Exam  Constitutional: She appears well-developed and well-nourished. No distress.  HENT:  Nose: Mucosal edema present.  Cardiovascular: Normal rate, regular rhythm, normal heart sounds and intact distal pulses.   Pulmonary/Chest: Effort normal and breath sounds normal.  Genitourinary: Uterus normal. Pelvic exam was performed with patient prone. There is no rash, tenderness or lesion on the right labia. There is no rash, tenderness  or lesion on the left labia. Cervix exhibits no motion tenderness, no discharge and no friability. Vaginal discharge (white vaginal discharge with odor ) found.  Musculoskeletal: She exhibits no edema.  Lymphadenopathy:       Right: No inguinal adenopathy present.       Left: No inguinal adenopathy present.  Skin: Skin is warm and dry. No rash noted.   Lab Results  Component Value Date   HGBA1C 5.70 12/21/2014   CBG 92   Assessment & Plan:   Problem List Items Addressed This Visit    Allergic rhinitis   Relevant Medications   cetirizine (ZYRTEC) 10 MG tablet   Elevated BP   Relevant Orders   COMPLETE METABOLIC PANEL WITH GFR   Elevated hemoglobin A1c   Relevant Orders   HgB A1c (Completed)   Glucose (CBG) (Completed)   Fatigue   Relevant Orders   TSH   Glucose (CBG) (Completed)   Microcytic anemia   Relevant Orders   CBC   Pap smear for cervical cancer screening - Primary   Relevant Orders   Cytology - PAP [place]   Vitamin D deficiency   Relevant Orders   Vitamin D, 25-hydroxy    Other Visit Diagnoses    Screening for HIV (human immunodeficiency virus)        Relevant Orders    HIV antibody (with reflex)    Healthcare maintenance        Relevant Orders    Flu Vaccine QUAD 36+ mos IM (Completed)       No orders of the defined types were placed in this encounter.    Follow-up: No Follow-up on file.   Boykin Nearing MD

## 2014-12-21 NOTE — Progress Notes (Signed)
Establish care Annual physical Stated low energy no appetite unable to sleep Hx tobacco 1/2 ppday

## 2014-12-21 NOTE — Patient Instructions (Addendum)
Ms. Nicolaisen,  Thank you for coming in today. It was a pleasure meeting you. I look forward to being your primary doctor.   1. Depressed mood with poor sleep, fatigue, poor appetite I recommend counseling services.  You can pursue family counseling at [facility] [place] for other causes including vit D deficiency, anemia and thyroid disorder  Your blood sugar is normal.   2. Pap done today   3. Smoking: Smoking cessation support: smoking cessation hotline: 1-800-QUIT-NOW.  Smoking cessation classes are available through [facility]. Call [phone] or visit our website at [email or web address] I recommend nicotine patch, the best prices are at [employer] 14 mg for 6 weeks 7 mg for 6 weeks    4. Allergic rhinitis: zyrtec ordered   5. Elevated BP today: Work on quitting smoking Low salt diet  Walking for exercise and stress relief   F/u in 3 weeks with RN for BP check  F/u with me in 3 months   Dr. Adrian Blackwater

## 2014-12-21 NOTE — Progress Notes (Signed)
ASSESSMENT: Pt currently experiencing adjustment disorder with mixed anxiety and depressed mood. Pt needs to f/u with PCP and [facility], and would benefit from psychoeducation and supportive counseling regarding coping with resulting symptoms of anxiety and depression.  Stage of Change: contemplative  PLAN: 1. F/U with behavioral health consultant in as needed 2. Psychiatric Medications: none. 3. Behavioral recommendation(s):   -Do relaxation breathing exercises daily -Consider reading educational materials regarding coping with symptoms of anxiety and depression SUBJECTIVE: Pt. referred by Dr Adrian Blackwater for symptoms of anxiety and depression:  Pt. reports the following symptoms/concerns: Pt states that she has been feeling a lot of anxiety, worrying, and depression since her 52yo son went to prison four months ago, and is now out, living homeless in [place]. 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  Duration of problem: four months Severity: moderate  OBJECTIVE: Orientation & Cognition: Oriented x3. Thought processes normal and appropriate to situation. Mood: teary. Affect: appropriate Appearance: appropriate Risk of harm to self or others: no risk of harm to self or others Substance use: tobacco Assessments administered: PHQ9: 15/ GAD7: 12  Diagnosis: Adjustment disorder with mixed anxiety and depressed mood CPT Code: F43.23 -------------------------------------------- Other(s) present in the room: none  Time spent with patient in exam room: 20 minutes

## 2014-12-22 ENCOUNTER — Telehealth: Payer: Self-pay | Admitting: *Deleted

## 2014-12-22 DIAGNOSIS — N760 Acute vaginitis: Secondary | ICD-10-CM

## 2014-12-22 DIAGNOSIS — B9689 Other specified bacterial agents as the cause of diseases classified elsewhere: Secondary | ICD-10-CM | POA: Insufficient documentation

## 2014-12-22 LAB — HIV ANTIBODY (ROUTINE TESTING W REFLEX): HIV: NONREACTIVE

## 2014-12-22 LAB — CERVICOVAGINAL ANCILLARY ONLY
CHLAMYDIA, DNA PROBE: NEGATIVE
Neisseria Gonorrhea: NEGATIVE
TRICH (WINDOWPATH): NEGATIVE
Wet Prep (BD Affirm): POSITIVE — AB

## 2014-12-22 LAB — VITAMIN D 25 HYDROXY (VIT D DEFICIENCY, FRACTURES): Vit D, 25-Hydroxy: 20 ng/mL — ABNORMAL LOW (ref 30–100)

## 2014-12-22 LAB — CYTOLOGY - PAP

## 2014-12-22 LAB — TSH: TSH: 1.736 u[IU]/mL (ref 0.350–4.500)

## 2014-12-22 MED ORDER — FERROUS SULFATE 325 (65 FE) MG PO TABS: 325.0000 mg | ORAL_TABLET | Freq: Two times a day (BID) | ORAL | Status: DC

## 2014-12-22 MED ORDER — VITAMIN D3 50 MCG (2000 UT) PO TABS: 2000.0000 [IU] | ORAL_TABLET | Freq: Every day | ORAL | Status: DC

## 2014-12-22 MED ORDER — METRONIDAZOLE 500 MG PO TABS: 500.0000 mg | ORAL_TABLET | Freq: Two times a day (BID) | ORAL | Status: DC

## 2014-12-22 MED ORDER — FLUCONAZOLE 150 MG PO TABS: 150.0000 mg | ORAL_TABLET | Freq: Once | ORAL | Status: DC

## 2014-12-22 NOTE — Telephone Encounter (Signed)
-----   Message from Boykin Nearing, MD sent at 12/22/2014  9:19 AM EDT ----- BV on wet prep this is causing patient's vaginal odor. Will treat with flagyl followed by diflucan Vit D improved but still low at 20, take 2000 IU of D3 daily CBC with low Hgb this is anemia, iron deficiency most likely, take oral iron twice daily for 8 weeks

## 2014-12-22 NOTE — Telephone Encounter (Signed)
-----   Message from Boykin Nearing, MD sent at 12/22/2014  2:42 PM EDT ----- Negative Gc.chlam, [place]

## 2014-12-22 NOTE — Addendum Note (Signed)
Addended by: Boykin Nearing on: 12/22/2014 09:22 AM   Modules accepted: Orders, SmartSet

## 2014-12-22 NOTE — Telephone Encounter (Signed)
Date of birth verified by pt  Negative GC/Chlam and trick Positive BV Rx at the pharmacy  Low Vit D  Take vit D3 2000 unit per day  Low Hgb take iron bid for 8 week Pt verbalized understanding

## 2015-01-09 ENCOUNTER — Encounter: Payer: Self-pay | Admitting: Family Medicine

## 2015-01-09 ENCOUNTER — Ambulatory Visit: Payer: Self-pay | Attending: Family Medicine | Admitting: Family Medicine

## 2015-01-09 VITALS — BP 141/88 | HR 105 | Temp 98.5°F | Resp 16 | Ht 61.0 in | Wt 168.0 lb

## 2015-01-09 DIAGNOSIS — Z86718 Personal history of other venous thrombosis and embolism: Secondary | ICD-10-CM | POA: Insufficient documentation

## 2015-01-09 DIAGNOSIS — F17218 Nicotine dependence, cigarettes, with other nicotine-induced disorders: Secondary | ICD-10-CM

## 2015-01-09 DIAGNOSIS — F17210 Nicotine dependence, cigarettes, uncomplicated: Secondary | ICD-10-CM | POA: Insufficient documentation

## 2015-01-09 DIAGNOSIS — I739 Peripheral vascular disease, unspecified: Secondary | ICD-10-CM

## 2015-01-09 DIAGNOSIS — R7989 Other specified abnormal findings of blood chemistry: Secondary | ICD-10-CM

## 2015-01-09 DIAGNOSIS — R791 Abnormal coagulation profile: Secondary | ICD-10-CM

## 2015-01-09 DIAGNOSIS — R05 Cough: Secondary | ICD-10-CM | POA: Insufficient documentation

## 2015-01-09 DIAGNOSIS — Z0000 Encounter for general adult medical examination without abnormal findings: Secondary | ICD-10-CM

## 2015-01-09 DIAGNOSIS — I10 Essential (primary) hypertension: Secondary | ICD-10-CM | POA: Insufficient documentation

## 2015-01-09 MED ORDER — VARENICLINE TARTRATE 1 MG PO TABS: 1.0000 mg | ORAL_TABLET | Freq: Two times a day (BID) | ORAL | Status: DC

## 2015-01-09 MED ORDER — VARENICLINE TARTRATE 0.5 MG X 11 & 1 MG X 42 PO MISC: ORAL | Status: DC

## 2015-01-09 NOTE — Patient Instructions (Addendum)
Amanda Tran was seen today for cough and leg pain.  Diagnoses and all orders for this visit:  Cigarette nicotine dependence with other nicotine-induced disorder -     varenicline (CHANTIX STARTING MONTH PAK) 0.5 MG X 11 & 1 MG X 42 tablet; Taper per packet insert -     varenicline (CHANTIX CONTINUING MONTH PAK) 1 MG tablet; Take 1 tablet (1 mg total) by mouth 2 (two) times daily.  Bilateral claudication of lower limb (HCC) -     D-dimer, quantitative (not at [facility])   F/u in 6 weeks for smoking cessation and BP check You technically do have hypertension based on the last two readings. This can be controlled with exercise, healthy diet and quitting smoking.   Dr. Adrian Blackwater  Intermittent Claudication Intermittent claudication is pain in your leg that occurs when you walk or exercise and goes away when you rest. The pain can occur in one or both legs. CAUSES Intermittent claudication is caused by the buildup of plaque within the major arteries in the body (atherosclerosis). The plaque, which makes arteries stiff and narrow, prevents enough blood from reaching your leg muscles. The pain occurs when you walk or exercise because your muscles need more blood when you are moving and exercising. RISK FACTORS Risk factors include:  A family history of atherosclerosis.  A personal history of stroke or heart disease.  Older age.  Being inactive or overweight.  Smoking cigarettes.  Having another health condition such as:  Diabetes.  High blood pressure.  High cholesterol. SIGNS AND SYMPTOMS  Your hip or leg may:   Ache.  Cramp.  Feel tight.  Feel weak.  Feel heavy. Over time, you may feel pain in your calf, thigh, or hip. DIAGNOSIS  Your health care provider may diagnose intermittent claudication based on your symptoms and medical history. Your health care provider may also do tests to learn more about your condition. These may include:  Blood tests.  An ultrasound.  Imaging  tests such as angiography, magnetic resonance angiography (MRA), and computed tomography angiography (CTA). TREATMENT You may be treated for problems such as:  High blood pressure.  High cholesterol.  Diabetes. Other treatments may include:  Lifestyle changes such as:  Starting an exercise program.  Losing weight.  Quitting smoking.  Medicines to help restore blood flow through your legs.  Blood vessel surgery (angioplasty) to restore blood flow if your intermittent claudication is caused by severe peripheral artery disease. HOME CARE INSTRUCTIONS  Manage any other health conditions you have.  Eat a diet low in saturated fats and calories to maintain a healthy weight.  Quit smoking, if you smoke.  Take medicines only as directed by your health care provider.  If your health care provider recommended an exercise program for you, follow it as directed. Your exercise program may involve:  Walking three or more times a week.  Walking until you have certain symptoms of intermittent claudication.  Resting until symptoms go away.  Gradually increasing walking time to about 50 minutes a day. SEEK MEDICAL CARE IF: Your condition is not getting better or is getting worse. SEEK IMMEDIATE MEDICAL CARE IF:   You have chest pain.  You have difficulty breathing.  You develop arm weakness.  You have trouble speaking.  Your face begins to droop. MAKE SURE YOU:  Understand these instructions.  Will watch your condition.  Will get help if you are not doing well or get worse.   This information is not intended to replace  advice given to you by your health care provider. Make sure you discuss any questions you have with your health care provider.   Document Released: 01/12/2004 Document Revised: 04/01/2014 Document Reviewed: 06/17/2013 Elsevier Interactive Patient Education 2016 [employer].  Hypertension Hypertension, commonly called high blood pressure, is when the  force of blood pumping through your arteries is too strong. Your arteries are the blood vessels that carry blood from your heart throughout your body. A blood pressure reading consists of a higher number over a lower number, such as 110/72. The higher number (systolic) is the pressure inside your arteries when your heart pumps. The lower number (diastolic) is the pressure inside your arteries when your heart relaxes. Ideally you want your blood pressure below 120/80. Hypertension forces your heart to work harder to pump blood. Your arteries may become narrow or stiff. Having untreated or uncontrolled hypertension can cause heart attack, stroke, kidney disease, and other problems. RISK FACTORS Some risk factors for high blood pressure are controllable. Others are not.  Risk factors you cannot control include:   Race. You may be at higher risk if you are African American.  Age. Risk increases with age.  Gender. Men are at higher risk than women before age 38 years. After age 61, women are at higher risk than men. Risk factors you can control include:  Not getting enough exercise or physical activity.  Being overweight.  Getting too much fat, sugar, calories, or salt in your diet.  Drinking too much alcohol. SIGNS AND SYMPTOMS Hypertension does not usually cause signs or symptoms. Extremely high blood pressure (hypertensive crisis) may cause headache, anxiety, shortness of breath, and nosebleed. DIAGNOSIS To check if you have hypertension, your health care provider will measure your blood pressure while you are seated, with your arm held at the level of your heart. It should be measured at least twice using the same arm. Certain conditions can cause a difference in blood pressure between your right and left arms. A blood pressure reading that is higher than normal on one occasion does not mean that you need treatment. If it is not clear whether you have high blood pressure, you may be asked to  return on a different day to have your blood pressure checked again. Or, you may be asked to monitor your blood pressure at home for 1 or more weeks. TREATMENT Treating high blood pressure includes making lifestyle changes and possibly taking medicine. Living a healthy lifestyle can help lower high blood pressure. You may need to change some of your habits. Lifestyle changes may include:  Following the DASH diet. This diet is high in fruits, vegetables, and whole grains. It is low in salt, red meat, and added sugars.  Keep your sodium intake below 2,300 mg per day.  Getting at least 30-45 minutes of aerobic exercise at least 4 times per week.  Losing weight if necessary.  Not smoking.  Limiting alcoholic beverages.  Learning ways to reduce stress. Your health care provider may prescribe medicine if lifestyle changes are not enough to get your blood pressure under control, and if one of the following is true:  You are 85-77 years of age and your systolic blood pressure is above 140.  You are 46 years of age or older, and your systolic blood pressure is above 150.  Your diastolic blood pressure is above 90.  You have diabetes, and your systolic blood pressure is over 443 or your diastolic blood pressure is over 90.  You  have kidney disease and your blood pressure is above 140/90.  You have heart disease and your blood pressure is above 140/90. Your personal target blood pressure may vary depending on your medical conditions, your age, and other factors. HOME CARE INSTRUCTIONS  Have your blood pressure rechecked as directed by your health care provider.   Take medicines only as directed by your health care provider. Follow the directions carefully. Blood pressure medicines must be taken as prescribed. The medicine does not work as well when you skip doses. Skipping doses also puts you at risk for problems.  Do not smoke.   Monitor your blood pressure at home as directed by your  health care provider. SEEK MEDICAL CARE IF:   You think you are having a reaction to medicines taken.  You have recurrent headaches or feel dizzy.  You have swelling in your ankles.  You have trouble with your vision. SEEK IMMEDIATE MEDICAL CARE IF:  You develop a severe headache or confusion.  You have unusual weakness, numbness, or feel faint.  You have severe chest or abdominal pain.  You vomit repeatedly.  You have trouble breathing. MAKE SURE YOU:   Understand these instructions.  Will watch your condition.  Will get help right away if you are not doing well or get worse.   This information is not intended to replace advice given to you by your health care provider. Make sure you discuss any questions you have with your health care provider.   Document Released: 03/11/2005 Document Revised: 07/26/2014 Document Reviewed: 01/01/2013 Elsevier Interactive Patient Education [employer].

## 2015-01-09 NOTE — Progress Notes (Signed)
Patient ID: Amanda Tran, female   DOB: June 13, 1965, 49 y.o.   MRN: [id number]   Subjective:  Patient ID: Amanda Tran, female    DOB: 1965-07-26  Age: 49 y.o. MRN: [id number]  CC: Cough and Leg Pain   HPI [place] presents for   1. Leg pain: R leg. Calf pain when walking. Goes away with rest. Patient smokes 1 PPD. Slight pain in L calf is not as bad. Patient with hx of DVT in L leg in 2013.  Doppler report 03/11/2012 Findings consistent with acute deep vein thrombosis involving the left distal femoral vein, left popliteal vein, left proximal posterial tibial vein, and left proximal peroneal vein. - No evidence of Baker's cyst on the left. Other specific details can be found in the table(s) above.  Prepared and Electronically Authenticated by  2. Cough: x 2 days. Grandson and daughter with cough.  With congestion. No CP, SOB or fever.   Social History  Substance Use Topics  . Smoking status: Current Every Day Smoker -- 0.50 packs/day for 25 years    Types: Cigarettes  . Smokeless tobacco: Never Used  . Alcohol Use: No    Outpatient Prescriptions Prior to Visit  Medication Sig Dispense Refill  . cetirizine (ZYRTEC) 10 MG tablet Take 1 tablet (10 mg total) by mouth daily. 30 tablet 11  . Cholecalciferol (VITAMIN D3) 2000 UNITS TABS Take 2,000 Units by mouth daily. 30 tablet 11  . ferrous sulfate 325 (65 FE) MG tablet Take 1 tablet (325 mg total) by mouth 2 (two) times daily with a meal. 60 tablet 1  . fluconazole (DIFLUCAN) 150 MG tablet Take 1 tablet (150 mg total) by mouth once. (Patient not taking: Reported on 01/09/2015) 1 tablet 0  . metroNIDAZOLE (FLAGYL) 500 MG tablet Take 1 tablet (500 mg total) by mouth 2 (two) times daily. (Patient not taking: Reported on 01/09/2015) 14 tablet 0   No facility-administered medications prior to visit.    ROS Review of Systems  Constitutional: Negative for fever and chills.  HENT: Positive for  congestion.   Eyes: Negative for visual disturbance.  Respiratory: Positive for cough. Negative for shortness of breath.   Cardiovascular: Negative for chest pain and leg swelling.  Gastrointestinal: Negative for abdominal pain and blood in stool.  Musculoskeletal: Positive for myalgias. Negative for back pain and arthralgias.  Skin: Negative for rash.  Allergic/Immunologic: Negative for immunocompromised state.  Hematological: Negative for adenopathy. Does not bruise/bleed easily.  Psychiatric/Behavioral: Negative for suicidal ideas and dysphoric mood.    Objective:  BP 141/88 mmHg  Pulse 105  Temp(Src) 98.5 F (36.9 C) (Oral)  Resp 16  Ht 5\' 1"  (1.549 m)  Wt 168 lb (76.204 kg)  BMI 31.76 kg/m2  SpO2 100%  LMP 12/08/2014  Pulse Readings from Last 3 Encounters:  01/09/15 105  12/21/14 88  06/26/14 86   BP/Weight 01/09/2015 07/10/4079 06/27/8183  Systolic BP 631 497 026  Diastolic BP 88 90 90  Wt. (Lbs) 168 169 -  BMI 31.76 31.95 -   Physical Exam  Constitutional: She is oriented to person, place, and time. She appears well-developed and well-nourished. No distress.  HENT:  Head: Normocephalic and atraumatic.  Cardiovascular: Normal rate, regular rhythm, normal heart sounds and intact distal pulses.   Pulmonary/Chest: Effort normal and breath sounds normal.  Musculoskeletal: She exhibits no edema or tenderness.  No calf tenderness No palpable cords Negative Homan sign   Neurological: She is alert and oriented to  person, place, and time.  Skin: Skin is warm and dry. No rash noted. No erythema.  Psychiatric: She has a normal mood and affect.    Lab Results  Component Value Date   HGBA1C 5.70 12/21/2014   Assessment & Plan:   Problem List Items Addressed This Visit    Bilateral claudication of lower limb ([place])   Relevant Orders   D-dimer, quantitative (not at [facility])   HTN (hypertension) (Chronic)    A; HTN based on last two OV readings P: smoking cessation, low  salt diet and exercise        Nicotine dependence - Primary (Chronic)   Relevant Medications   varenicline (CHANTIX STARTING MONTH PAK) 0.5 MG X 11 & 1 MG X 42 tablet   varenicline (CHANTIX CONTINUING MONTH PAK) 1 MG tablet    Other Visit Diagnoses    Healthcare maintenance        Relevant Orders    PPD (Completed)       Meds ordered this encounter  Medications  . varenicline (CHANTIX STARTING MONTH PAK) 0.5 MG X 11 & 1 MG X 42 tablet    Sig: Taper per packet insert    Dispense:  53 tablet    Refill:  0  . varenicline (CHANTIX CONTINUING MONTH PAK) 1 MG tablet    Sig: Take 1 tablet (1 mg total) by mouth 2 (two) times daily.    Dispense:  60 tablet    Refill:  1    Follow-up: No Follow-up on file.   Boykin Nearing MD

## 2015-01-09 NOTE — Progress Notes (Signed)
C/C rt leg pain, pain worsen with walking  Pain scale #2 Dry cough x2 days Hx tobacco 1 ppday

## 2015-01-09 NOTE — Assessment & Plan Note (Signed)
A; HTN based on last two OV readings P: smoking cessation, low salt diet and exercise

## 2015-01-10 DIAGNOSIS — R7989 Other specified abnormal findings of blood chemistry: Secondary | ICD-10-CM | POA: Insufficient documentation

## 2015-01-10 LAB — D-DIMER, QUANTITATIVE (NOT AT ARMC): D DIMER QUANT: 0.72 ug{FEU}/mL — AB (ref 0.00–0.48)

## 2015-01-10 NOTE — Assessment & Plan Note (Signed)
Elevated d dimer with R calf pain hx of DVT  R/o DVT with b/l LE venous dopplers

## 2015-01-10 NOTE — Addendum Note (Signed)
Addended by: Boykin Nearing on: 01/10/2015 01:43 PM   Modules accepted: Orders

## 2015-01-11 ENCOUNTER — Telehealth: Payer: Self-pay | Admitting: General Practice

## 2015-01-11 ENCOUNTER — Encounter: Payer: Self-pay | Admitting: *Deleted

## 2015-01-11 ENCOUNTER — Encounter: Payer: Self-pay | Admitting: Pharmacist

## 2015-01-11 LAB — TB SKIN TEST: TB Skin Test: NEGATIVE

## 2015-01-11 NOTE — Telephone Encounter (Signed)
Date of birth verified by pt Lab results given to pt  Elevated D dimer Doppler schedule for tomorrow Jan 12, 2015 at 1100 arriving 15 min early/ vascular lab [facility]  Pt notified

## 2015-01-11 NOTE — Telephone Encounter (Signed)
-----   Message from Boykin Nearing, MD sent at 01/10/2015  1:44 PM EDT ----- Elevated D dimer with R calf pain LE venous doppler ordered, please schedule and call patient Patient to take aspirin 325 mg daily until doppler done

## 2015-01-11 NOTE — Telephone Encounter (Signed)
Patient presents to clinic to speak to nurse to request medication for cough. Patient was just seen in clinic on 01/09/15. Patient states she was seen for cough at the time of visit but thought the cough would ease up on w/ time. Please assist

## 2015-01-12 ENCOUNTER — Ambulatory Visit (HOSPITAL_COMMUNITY)
Admission: RE | Admit: 2015-01-12 | Discharge: 2015-01-12 | Disposition: A | Discharge status: Home / Self Care | Payer: Self-pay | Source: Ambulatory Visit | Attending: Family Medicine | Admitting: Family Medicine

## 2015-01-12 DIAGNOSIS — R791 Abnormal coagulation profile: Secondary | ICD-10-CM

## 2015-01-12 DIAGNOSIS — M79662 Pain in left lower leg: Secondary | ICD-10-CM | POA: Insufficient documentation

## 2015-01-12 DIAGNOSIS — R7989 Other specified abnormal findings of blood chemistry: Secondary | ICD-10-CM

## 2015-01-12 NOTE — Progress Notes (Signed)
Preliminary results by tech - Bilateral Lower Ext. Venous Duplex Completed. Negative for deep and superficial vein thrombosis in both lower extremities.  Amanda Tran, BS, RDMS, RVT  

## 2015-01-16 ENCOUNTER — Telehealth: Payer: Self-pay | Admitting: Family Medicine

## 2015-01-16 ENCOUNTER — Emergency Department (HOSPITAL_COMMUNITY)
Admission: EM | Admit: 2015-01-16 | Discharge: 2015-01-16 | Disposition: A | Discharge status: Home / Self Care | Payer: Self-pay | Attending: Emergency Medicine | Admitting: Emergency Medicine

## 2015-01-16 ENCOUNTER — Encounter (HOSPITAL_COMMUNITY): Payer: Self-pay | Admitting: Cardiology

## 2015-01-16 DIAGNOSIS — Z86718 Personal history of other venous thrombosis and embolism: Secondary | ICD-10-CM | POA: Insufficient documentation

## 2015-01-16 DIAGNOSIS — Z720 Tobacco use: Secondary | ICD-10-CM | POA: Insufficient documentation

## 2015-01-16 DIAGNOSIS — I10 Essential (primary) hypertension: Secondary | ICD-10-CM | POA: Insufficient documentation

## 2015-01-16 DIAGNOSIS — Z79899 Other long term (current) drug therapy: Secondary | ICD-10-CM | POA: Insufficient documentation

## 2015-01-16 DIAGNOSIS — J0110 Acute frontal sinusitis, unspecified: Secondary | ICD-10-CM | POA: Insufficient documentation

## 2015-01-16 NOTE — Telephone Encounter (Signed)
Pt presently at ED

## 2015-01-16 NOTE — Discharge Instructions (Signed)
Hypertension Hypertension, commonly called high blood pressure, is when the force of blood pumping through your arteries is too strong. Your arteries are the blood vessels that carry blood from your heart throughout your body. A blood pressure reading consists of a higher number over a lower number, such as 110/72. The higher number (systolic) is the pressure inside your arteries when your heart pumps. The lower number (diastolic) is the pressure inside your arteries when your heart relaxes. Ideally you want your blood pressure below 120/80. Hypertension forces your heart to work harder to pump blood. Your arteries may become narrow or stiff. Having untreated or uncontrolled hypertension can cause heart attack, stroke, kidney disease, and other problems. RISK FACTORS Some risk factors for high blood pressure are controllable. Others are not.  Risk factors you cannot control include:   Race. You may be at higher risk if you are African American.  Age. Risk increases with age.  Gender. Men are at higher risk than women before age 45 years. After age 65, women are at higher risk than men. Risk factors you can control include:  Not getting enough exercise or physical activity.  Being overweight.  Getting too much fat, sugar, calories, or salt in your diet.  Drinking too much alcohol. SIGNS AND SYMPTOMS Hypertension does not usually cause signs or symptoms. Extremely high blood pressure (hypertensive crisis) may cause headache, anxiety, shortness of breath, and nosebleed. DIAGNOSIS To check if you have hypertension, your health care provider will measure your blood pressure while you are seated, with your arm held at the level of your heart. It should be measured at least twice using the same arm. Certain conditions can cause a difference in blood pressure between your right and left arms. A blood pressure reading that is higher than normal on one occasion does not mean that you need treatment. If  it is not clear whether you have high blood pressure, you may be asked to return on a different day to have your blood pressure checked again. Or, you may be asked to monitor your blood pressure at home for 1 or more weeks. TREATMENT Treating high blood pressure includes making lifestyle changes and possibly taking medicine. Living a healthy lifestyle can help lower high blood pressure. You may need to change some of your habits. Lifestyle changes may include:  Following the DASH diet. This diet is high in fruits, vegetables, and whole grains. It is low in salt, red meat, and added sugars.  Keep your sodium intake below 2,300 mg per day.  Getting at least 30-45 minutes of aerobic exercise at least 4 times per week.  Losing weight if necessary.  Not smoking.  Limiting alcoholic beverages.  Learning ways to reduce stress. Your health care provider may prescribe medicine if lifestyle changes are not enough to get your blood pressure under control, and if one of the following is true:  You are 18-59 years of age and your systolic blood pressure is above 140.  You are 60 years of age or older, and your systolic blood pressure is above 150.  Your diastolic blood pressure is above 90.  You have diabetes, and your systolic blood pressure is over 140 or your diastolic blood pressure is over 90.  You have kidney disease and your blood pressure is above 140/90.  You have heart disease and your blood pressure is above 140/90. Your personal target blood pressure may vary depending on your medical conditions, your age, and other factors. HOME CARE INSTRUCTIONS    Have your blood pressure rechecked as directed by your health care provider.   Take medicines only as directed by your health care provider. Follow the directions carefully. Blood pressure medicines must be taken as prescribed. The medicine does not work as well when you skip doses. Skipping doses also puts you at risk for  problems.  Do not smoke.   Monitor your blood pressure at home as directed by your health care provider. SEEK MEDICAL CARE IF:   You think you are having a reaction to medicines taken.  You have recurrent headaches or feel dizzy.  You have swelling in your ankles.  You have trouble with your vision. SEEK IMMEDIATE MEDICAL CARE IF:  You develop a severe headache or confusion.  You have unusual weakness, numbness, or feel faint.  You have severe chest or abdominal pain.  You vomit repeatedly.  You have trouble breathing. MAKE SURE YOU:   Understand these instructions.  Will watch your condition.  Will get help right away if you are not doing well or get worse.   This information is not intended to replace advice given to you by your health care provider. Make sure you discuss any questions you have with your health care provider.   Document Released: 03/11/2005 Document Revised: 07/26/2014 Document Reviewed: 01/01/2013 Elsevier Interactive Patient Education 2016 [employer].  Please contact your primary care provider and schedule follow-up evaluation.  Please use saline nasal rinses, Zyrtec, ibuprofen or Tylenol as needed.

## 2015-01-16 NOTE — ED Notes (Signed)
Reports her BP was elevated today when she went to the [facility] and checked it. Also reports a headache.

## 2015-01-16 NOTE — Telephone Encounter (Signed)
Pt. Came in today stating to speak to nurse because her BP is high 148/102. Pt. Would like to know what to do. Please f/u with pt. For advise.

## 2015-01-16 NOTE — ED Notes (Signed)
Declined W/C at D/C and was escorted to lobby by RN. 

## 2015-01-16 NOTE — ED Provider Notes (Signed)
CSN: [id number]     Arrival date & time 01/16/15  1320 History  By signing my name below, I, Soijett Blue, attest that this documentation has been prepared under the direction and in the presence of Lenn Sink, PA-C Electronically Signed: Soijett Blue, ED Scribe. 01/16/2015. 3:40 PM.   Chief Complaint  Patient presents with  . Hypertension      The history is provided by the patient. No language interpreter was used.    Amanda Tran is a 49 y.o. female who presents to the Emergency Department complaining of HTN onset this morning. She notes that she went to [facility] and checked her blood pressure, and it was 148/102. She notes that she does not take HTN medications but her blood pressure has been this high for 1 month and her PCP wanted her to manage her blood pressure with life changes. She is unsure of when her last doctors appointment is. Pt is having associated symptoms of left sided HA, blurred vision, cough, watery eyes, and sinus pressure. She reports that she does not typically get HA and that her HA is mildly resolved. She notes that she has tried 2 excedrin with no relief of her symptoms. She denies rhinorrhea, fever, and any other symptoms. She notes that she has a hx of seasonal allergies that she took zyrtec but she hasn't been taking it as she should.   Past Medical History  Diagnosis Date  . DVT (deep venous thrombosis) ([facility]) 03/11/2012    Left popliteal    Past Surgical History  Procedure Laterality Date  . Cesarean section      9798, [id number], 1996   Family History  Problem Relation Age of Onset  . Hypertension Father   . Diabetes Father    Social History  Substance Use Topics  . Smoking status: Current Every Day Smoker -- 0.50 packs/day for 25 years    Types: Cigarettes  . Smokeless tobacco: Never Used  . Alcohol Use: No   OB History    No data available     Review of Systems  Constitutional: Negative for fever.  HENT: Positive for sinus  pressure. Negative for rhinorrhea.   Eyes: Positive for visual disturbance (blurred vision).       Watery eyes  Respiratory: Positive for cough.   Neurological: Positive for headaches.      Allergies  Review of patient's allergies indicates no known allergies.  Home Medications   Prior to Admission medications   Medication Sig Start Date End Date Taking? Authorizing Provider  cetirizine (ZYRTEC) 10 MG tablet Take 1 tablet (10 mg total) by mouth daily. 12/21/14   Josalyn Funches, MD  Cholecalciferol (VITAMIN D3) 2000 UNITS TABS Take 2,000 Units by mouth daily. 12/22/14   Boykin Nearing, MD  ferrous sulfate 325 (65 FE) MG tablet Take 1 tablet (325 mg total) by mouth 2 (two) times daily with a meal. 12/22/14   Josalyn Funches, MD  varenicline (CHANTIX CONTINUING MONTH PAK) 1 MG tablet Take 1 tablet (1 mg total) by mouth 2 (two) times daily. 01/09/15   Josalyn Funches, MD  varenicline (CHANTIX STARTING MONTH PAK) 0.5 MG X 11 & 1 MG X 42 tablet Taper per packet insert 01/09/15   Josalyn Funches, MD   BP 136/91 mmHg  Pulse 79  Temp(Src) 98.6 F (37 C) (Oral)  Resp 18  Wt 168 lb (76.204 kg)  SpO2 99%  LMP 01/06/2015 Physical Exam  Constitutional: She is oriented to person, place, and time. She appears  well-developed and well-nourished. No distress.  HENT:  Head: Normocephalic and atraumatic.  Right Ear: Tympanic membrane and ear canal normal.  Left Ear: Tympanic membrane and ear canal normal.  Mouth/Throat: Oropharynx is clear and moist. No oropharyngeal exudate.  Erythematous turbinates.  Eyes: Conjunctivae and EOM are normal. Pupils are equal, round, and reactive to light.  Neck: Normal range of motion. Neck supple.  Cardiovascular: Normal rate, regular rhythm, normal heart sounds and normal pulses.  Exam reveals no gallop and no friction rub.   No murmur heard. Pulmonary/Chest: Effort normal and breath sounds normal. No respiratory distress. She has no wheezes. She has no rales.   Abdominal: Soft. There is no tenderness.  Musculoskeletal: Normal range of motion.  Neurological: She is alert and oriented to person, place, and time.  Skin: Skin is warm and dry.  Psychiatric: She has a normal mood and affect. Her behavior is normal.  Nursing note and vitals reviewed.   ED Course  Procedures (including critical care time) DIAGNOSTIC STUDIES: Oxygen Saturation is 100% on RA, nl by my interpretation.    COORDINATION OF CARE: 3:31 PM Discussed treatment plan with pt at bedside which includes ibuprofen/tylenol PRN, f/u with PCP for re-evaluation, nasal saline rinse, and began zyrtec and pt agreed to plan.    Labs Review Labs Reviewed - No data to display  Imaging Review No results found.    EKG Interpretation None      MDM   Final diagnoses:  Essential hypertension  Acute frontal sinusitis, recurrence not specified    Labs:   Imaging:   Consults:   Therapeutics:   Discharge Meds:   Assessment/Plan: f/u with PCP for re-evaluation of symptoms, began taking zyrtec, use tylenol/ibuprofen PRN, and nasal saline rinses.   I personally performed the services described in this documentation, which was scribed in my presence. The recorded information has been reviewed and is accurate.    Okey Regal, PA-C 01/17/15 0626  Evelina Bucy, MD 01/19/15 [phone]

## 2015-01-19 ENCOUNTER — Telehealth: Payer: Self-pay | Admitting: *Deleted

## 2015-01-19 NOTE — Telephone Encounter (Signed)
Unable to contact pt  Wrong number on file  Send communication letter to pt

## 2015-01-19 NOTE — Telephone Encounter (Signed)
-----   Message from Boykin Nearing, MD sent at 01/12/2015  3:47 PM EDT ----- Normal venous doppler No DVT

## 2015-03-13 ENCOUNTER — Telehealth: Payer: Self-pay | Admitting: Family Medicine

## 2015-03-13 ENCOUNTER — Other Ambulatory Visit: Payer: Self-pay | Admitting: Internal Medicine

## 2015-03-13 DIAGNOSIS — D509 Iron deficiency anemia, unspecified: Secondary | ICD-10-CM

## 2015-03-13 MED ORDER — FERROUS SULFATE 325 (65 FE) MG PO TABS: 325.0000 mg | ORAL_TABLET | Freq: Two times a day (BID) | ORAL | Status: DC

## 2015-03-13 NOTE — Telephone Encounter (Signed)
Patient called and requested a med refill foferrous sulfate 325 (65 FE) MG tablet.  Please f/u with pt.

## 2015-03-13 NOTE — Telephone Encounter (Signed)
Rx refill send to [facility] pharmacy  Pt notified  

## 2015-06-15 ENCOUNTER — Other Ambulatory Visit: Payer: Self-pay | Admitting: Internal Medicine

## 2015-07-10 ENCOUNTER — Telehealth: Payer: Self-pay | Admitting: Family Medicine

## 2015-07-10 DIAGNOSIS — E559 Vitamin D deficiency, unspecified: Secondary | ICD-10-CM

## 2015-07-10 DIAGNOSIS — F17218 Nicotine dependence, cigarettes, with other nicotine-induced disorders: Secondary | ICD-10-CM

## 2015-07-10 DIAGNOSIS — I10 Essential (primary) hypertension: Secondary | ICD-10-CM

## 2015-07-10 NOTE — Telephone Encounter (Signed)
Patient called requesting medication refill for vitamin D and needs to know if she should continue to take Asprin everyday. Please f/u

## 2015-07-11 MED ORDER — VITAMIN D3 50 MCG (2000 UT) PO TABS: 2000.0000 [IU] | ORAL_TABLET | Freq: Every day | ORAL | Status: DC

## 2015-07-11 MED ORDER — ASPIRIN 81 MG PO TABS: 81.0000 mg | ORAL_TABLET | Freq: Every day | ORAL | Status: DC

## 2015-07-11 NOTE — Telephone Encounter (Signed)
LVM to return call.

## 2015-07-11 NOTE — Telephone Encounter (Signed)
Refilled vitamin D She is advised to take AS 81 mg daily as it helps lower risk of heart disease and stroke and her risk is higher than the average due to HTN and smoking.

## 2015-07-12 NOTE — Telephone Encounter (Signed)
Pt notified Rx Vit D refill send to pharmacy  Continue taking ASA 81mg  Pt verbalized understanding

## 2015-07-12 NOTE — Telephone Encounter (Signed)
Pt. Returned call. Please f/u °

## 2015-07-14 ENCOUNTER — Other Ambulatory Visit: Payer: Self-pay | Admitting: Internal Medicine

## 2015-10-06 ENCOUNTER — Encounter: Payer: Self-pay | Admitting: Family Medicine

## 2015-10-06 ENCOUNTER — Ambulatory Visit: Payer: Self-pay | Attending: Family Medicine | Admitting: Family Medicine

## 2015-10-06 VITALS — BP 127/86 | HR 84 | Temp 98.3°F | Resp 18 | Ht 61.0 in | Wt 169.0 lb

## 2015-10-06 DIAGNOSIS — F17218 Nicotine dependence, cigarettes, with other nicotine-induced disorders: Secondary | ICD-10-CM

## 2015-10-06 DIAGNOSIS — Z7982 Long term (current) use of aspirin: Secondary | ICD-10-CM | POA: Insufficient documentation

## 2015-10-06 DIAGNOSIS — Z0000 Encounter for general adult medical examination without abnormal findings: Secondary | ICD-10-CM | POA: Insufficient documentation

## 2015-10-06 DIAGNOSIS — L0292 Furuncle, unspecified: Secondary | ICD-10-CM | POA: Insufficient documentation

## 2015-10-06 DIAGNOSIS — Z79899 Other long term (current) drug therapy: Secondary | ICD-10-CM | POA: Insufficient documentation

## 2015-10-06 DIAGNOSIS — D509 Iron deficiency anemia, unspecified: Secondary | ICD-10-CM

## 2015-10-06 DIAGNOSIS — F17210 Nicotine dependence, cigarettes, uncomplicated: Secondary | ICD-10-CM | POA: Insufficient documentation

## 2015-10-06 DIAGNOSIS — N898 Other specified noninflammatory disorders of vagina: Secondary | ICD-10-CM | POA: Insufficient documentation

## 2015-10-06 MED ORDER — FERROUS SULFATE 325 (65 FE) MG PO TABS: 325.0000 mg | ORAL_TABLET | Freq: Two times a day (BID) | ORAL | Status: DC

## 2015-10-06 MED ORDER — VARENICLINE TARTRATE 0.5 MG X 11 & 1 MG X 42 PO MISC: ORAL | Status: DC

## 2015-10-06 MED ORDER — FLUCONAZOLE 150 MG PO TABS
150.0000 mg | ORAL_TABLET | Freq: Once | ORAL | Status: DC
Start: 2015-10-06 — End: 2016-03-07

## 2015-10-06 MED ORDER — VARENICLINE TARTRATE 1 MG PO TABS: 1.0000 mg | ORAL_TABLET | Freq: Two times a day (BID) | ORAL | Status: DC

## 2015-10-06 MED ORDER — METRONIDAZOLE 500 MG PO TABS: 500.0000 mg | ORAL_TABLET | Freq: Two times a day (BID) | ORAL | Status: DC

## 2015-10-06 MED FILL — FERROUS SULFATE 325 MG TAB: 325 (65 FE) | 30 days supply | Qty: 60 | Fill #0

## 2015-10-06 MED FILL — metroNIDAZOLE 500 MG TABS: 500 | 7 days supply | Qty: 14 | Fill #0

## 2015-10-06 MED FILL — FLUCONAZOLE 150 MG TABLET: 150 | 1 days supply | Qty: 1 | Fill #0

## 2015-10-06 NOTE — Assessment & Plan Note (Signed)
R lower abdomen  Apply warm compress to lower abdomen area will either resolve or come to a head a burst releasing a small amount of white discharge. Call if the area becomes more red, swollen or painful.

## 2015-10-06 NOTE — Assessment & Plan Note (Addendum)
Due to BV Flagyl followed by diflucan ordered  To prevent BV is to avoid douche and avoid semen in vagina to maintain normal vaginal pH. Have your partner ejaculate outside of your vagina or in a condom.

## 2015-10-06 NOTE — Progress Notes (Signed)
SUBJECTIVE:  50 y.o. female for annual routine checkup.  Social History  Substance Use Topics  . Smoking status: Current Every Day Smoker -- 0.50 packs/day for 25 years    Types: Cigarettes  . Smokeless tobacco: Never Used  . Alcohol Use: No    Current Outpatient Prescriptions  Medication Sig Dispense Refill  . aspirin 81 MG tablet Take 1 tablet (81 mg total) by mouth daily. 30 tablet 11  . cetirizine (ZYRTEC) 10 MG tablet Take 1 tablet (10 mg total) by mouth daily. 30 tablet 11  . Cholecalciferol (VITAMIN D3) 2000 units TABS Take 2,000 Units by mouth daily. 30 tablet 11  . ferrous sulfate 325 (65 FE) MG tablet Take 1 tablet (325 mg total) by mouth 2 (two) times daily with a meal. 60 tablet 1  . varenicline (CHANTIX CONTINUING MONTH PAK) 1 MG tablet Take 1 tablet (1 mg total) by mouth 2 (two) times daily. (Patient not taking: Reported on 10/06/2015) 60 tablet 1  . varenicline (CHANTIX STARTING MONTH PAK) 0.5 MG X 11 & 1 MG X 42 tablet Taper per packet insert (Patient not taking: Reported on 10/06/2015) 53 tablet 0   No current facility-administered medications for this visit.   Allergies: Review of patient's allergies indicates no known allergies.  Patient's last menstrual period was 10/03/2015.  ROS:  Feeling well. No dyspnea or chest pain on exertion.  No abdominal pain, change in bowel habits, black or bloody stools.  No urinary tract symptoms. GYN ROS: vaginal odor and irritation. normal menses, no abnormal bleeding, pelvic pain or discharge, no breast pain or new or enlarging lumps on self exam. No neurological complaints.  OBJECTIVE:  The patient appears well, alert, oriented x 3, in no distress. BP 127/86 mmHg  Pulse 84  Temp(Src) 98.3 F (36.8 C) (Oral)  Resp 18  Ht 5\' 1"  (1.549 m)  Wt 169 lb (76.658 kg)  BMI 31.95 kg/m2  SpO2 100%  LMP 10/03/2015 ENT normal.  Neck supple. No adenopathy or thyromegaly. PERLA. Lungs are clear, good air entry, no wheezes, rhonchi or rales.  S1 and S2 normal, no murmurs, regular rate and rhythm. Abdomen soft without tenderness, guarding, mass or organomegaly. 1x1 cm papule in LLQ, slightly tender.  Extremities show no edema, normal peripheral pulses. Neurological is normal, no focal findings.  BREAST EXAM: breasts appear normal, no suspicious masses, no skin or nipple changes or axillary nodes  PELVIC EXAM: normal external genitalia, vulva, vagina, cervix, uterus and adnexa. Scant yellow tinged vaginal discharge with odor.   ASSESSMENT:  well woman  PLAN:  Return in 3 months for smoking cessation

## 2015-10-06 NOTE — Patient Instructions (Addendum)
[place] was seen today for annual exam.  Diagnoses and all orders for this visit:  Cigarette nicotine dependence with other nicotine-induced disorder -     varenicline (CHANTIX CONTINUING MONTH PAK) 1 MG tablet; Take 1 tablet (1 mg total) by mouth 2 (two) times daily. -     varenicline (CHANTIX STARTING MONTH PAK) 0.5 MG X 11 & 1 MG X 42 tablet; Taper per packet insert  Microcytic anemia -     ferrous sulfate 325 (65 FE) MG tablet; Take 1 tablet (325 mg total) by mouth 2 (two) times daily with a meal.  Vaginal irritation -     Cervicovaginal ancillary only   Apply warm compress to lower abdomen area will either resolve or come to a head a burst releasing a small amount of white discharge. Call if the area becomes more red, swollen or painful.   To prevent BV is to avoid douche and avoid semen in vagina to maintain normal vaginal pH. Have your partner ejaculate outside of your vagina or in a condom.   Start chantix: Take one 0.5 mg tablet by mouth once daily for 3 days, then increase to one 0.5 mg tablet twice daily for 4 days, then increase to one 1 mg tablet twice daily. Set quit date for 8-35 days after starting chantix.  F/u in 3 months for smoking cessation  Dr. Adrian Blackwater

## 2015-10-06 NOTE — Progress Notes (Signed)
Patient is here for Physical  Patient complains of possibly having yeast. Patient denies any discharge. Patient complains of irritation. Patient admits to using new soaps and lotions.  Patient has taken medication today and patient has eaten.

## 2015-10-06 NOTE — Assessment & Plan Note (Signed)
Smoking cessation addressed  Start chantix: Take one 0.5 mg tablet by mouth once daily for 3 days, then increase to one 0.5 mg tablet twice daily for 4 days, then increase to one 1 mg tablet twice daily. Set quit date for 8-35 days after starting chantix.

## 2015-10-08 NOTE — Addendum Note (Signed)
Addended by: Kristen Cardinal on: 10/08/2015 07:13 PM   Modules accepted: Miquel Dunn

## 2015-10-09 LAB — CERVICOVAGINAL ANCILLARY ONLY: WET PREP (BD AFFIRM): POSITIVE — AB

## 2015-10-18 ENCOUNTER — Telehealth: Payer: Self-pay

## 2015-10-18 NOTE — Telephone Encounter (Signed)
Left message on patient's voicemail.

## 2015-10-26 ENCOUNTER — Telehealth: Payer: Self-pay

## 2015-10-26 NOTE — Telephone Encounter (Signed)
Patient was called and informed of results. 

## 2016-03-07 ENCOUNTER — Ambulatory Visit: Payer: Self-pay | Attending: Family Medicine | Admitting: Physician Assistant

## 2016-03-07 ENCOUNTER — Encounter: Payer: Self-pay | Admitting: Licensed Clinical Social Worker

## 2016-03-07 ENCOUNTER — Encounter: Payer: Self-pay | Admitting: Physician Assistant

## 2016-03-07 ENCOUNTER — Ambulatory Visit: Payer: Self-pay

## 2016-03-07 VITALS — BP 132/90 | HR 108 | Temp 98.6°F | Resp 16 | Wt 166.8 lb

## 2016-03-07 DIAGNOSIS — R030 Elevated blood-pressure reading, without diagnosis of hypertension: Secondary | ICD-10-CM | POA: Insufficient documentation

## 2016-03-07 DIAGNOSIS — Z7982 Long term (current) use of aspirin: Secondary | ICD-10-CM | POA: Insufficient documentation

## 2016-03-07 DIAGNOSIS — Z23 Encounter for immunization: Secondary | ICD-10-CM

## 2016-03-07 DIAGNOSIS — Z79899 Other long term (current) drug therapy: Secondary | ICD-10-CM | POA: Insufficient documentation

## 2016-03-07 DIAGNOSIS — G4700 Insomnia, unspecified: Secondary | ICD-10-CM | POA: Insufficient documentation

## 2016-03-07 MED ORDER — TRAZODONE HCL 50 MG PO TABS: 25.0000 mg | ORAL_TABLET | Freq: Every evening | ORAL | 1 refills | Status: DC | PRN

## 2016-03-07 MED FILL — traZODone HCL 50 MG TABS: 50 | 30 days supply | Qty: 30 | Fill #0

## 2016-03-07 NOTE — Social Work (Signed)
LCSWA introduced self and explained role at [place] disclosed that she received a consult from [facility] to address sleep hygiene.  Pt reported that within the last two months, she has had difficulty staying asleep. Pt reported waking up every hour and snacking to to fall back asleep. LCSWA inquired about any life changes within the last three months. Pt disclosed that her youngest child recently came home from prison in September 2017 and she has difficulty coping with increased worry about him. LCSWA provided education on anxiety and how it can negatively impact physical and mental health. LCSWA discussed benefits of applying healthy coping skills to decrease symptoms. Pt was open to mindfulness strategies to cope with stress and/or insomnia.   Pt was encouraged to contact LCSWA if symptoms worsen or fail to improve to schedule behavioral appointments at [facility]. No additional concerns noted.

## 2016-03-07 NOTE — Patient Instructions (Addendum)
Check Blood pressure several times weekly and record and bring to next visit   Influenza Virus Vaccine injection (Fluarix) What is this medicine? INFLUENZA VIRUS VACCINE (in floo EN zuh VAHY ruhs vak SEEN) helps to reduce the risk of getting influenza also known as the flu. This medicine may be used for other purposes; ask your health care provider or pharmacist if you have questions. COMMON BRAND NAME(S): Fluarix, Fluzone What should I tell my health care provider before I take this medicine? They need to know if you have any of these conditions: -bleeding disorder like hemophilia -fever or infection -Guillain-Barre syndrome or other neurological problems -immune system problems -infection with the human immunodeficiency virus (HIV) or AIDS -low blood platelet counts -multiple sclerosis -an unusual or allergic reaction to influenza virus vaccine, eggs, chicken proteins, latex, gentamicin, other medicines, foods, dyes or preservatives -pregnant or trying to get pregnant -breast-feeding How should I use this medicine? This vaccine is for injection into a muscle. It is given by a health care professional. A copy of Vaccine Information Statements will be given before each vaccination. Read this sheet carefully each time. The sheet may change frequently. Talk to your pediatrician regarding the use of this medicine in children. Special care may be needed. Overdosage: If you think you have taken too much of this medicine contact a poison control center or emergency room at once. NOTE: This medicine is only for you. Do not share this medicine with others. What if I miss a dose? This does not apply. What may interact with this medicine? -chemotherapy or radiation therapy -medicines that lower your immune system like etanercept, anakinra, infliximab, and adalimumab -medicines that treat or prevent blood clots like warfarin -phenytoin -steroid medicines like prednisone or  cortisone -theophylline -vaccines This list may not describe all possible interactions. Give your health care provider a list of all the medicines, herbs, non-prescription drugs, or dietary supplements you use. Also tell them if you smoke, drink alcohol, or use illegal drugs. Some items may interact with your medicine. What should I watch for while using this medicine? Report any side effects that do not go away within 3 days to your doctor or health care professional. Call your health care provider if any unusual symptoms occur within 6 weeks of receiving this vaccine. You may still catch the flu, but the illness is not usually as bad. You cannot get the flu from the vaccine. The vaccine will not protect against colds or other illnesses that may cause fever. The vaccine is needed every year. What side effects may I notice from receiving this medicine? Side effects that you should report to your doctor or health care professional as soon as possible: -allergic reactions like skin rash, itching or hives, swelling of the face, lips, or tongue Side effects that usually do not require medical attention (report to your doctor or health care professional if they continue or are bothersome): -fever -headache -muscle aches and pains -pain, tenderness, redness, or swelling at site where injected -weak or tired This list may not describe all possible side effects. Call your doctor for medical advice about side effects. You may report side effects to FDA at 1-800-FDA-1088. Where should I keep my medicine? This vaccine is only given in a clinic, pharmacy, doctor's office, or other health care setting and will not be stored at home. NOTE: This sheet is a summary. It may not cover all possible information. If you have questions about this medicine, talk to your doctor, pharmacist,  or health care provider.  2017 Elsevier/Gold Standard (2007-10-07 09:30:40) Td Vaccine (Tetanus and Diphtheria): What You Need to  Know 1. Why get vaccinated? Tetanus  and diphtheria are very serious diseases. They are rare in the [place] today, but people who do become infected often have severe complications. Td vaccine is used to protect adolescents and adults from both of these diseases. Both tetanus and diphtheria are infections caused by bacteria. Diphtheria spreads from person to person through coughing or sneezing. Tetanus-causing bacteria enter the body through cuts, scratches, or wounds. TETANUS (lockjaw) causes painful muscle tightening and stiffness, usually all over the body.  It can lead to tightening of muscles in the head and neck so you can't open your mouth, swallow, or sometimes even breathe. Tetanus kills about 1 out of every 10 people who are infected even after receiving the best medical care. DIPHTHERIA can cause a thick coating to form in the back of the throat.  It can lead to breathing problems, paralysis, heart failure, and death. Before vaccines, as many as 200,000 cases of diphtheria and hundreds of cases of tetanus were reported in the [place] each year. Since vaccination began, reports of cases for both diseases have dropped by about 99%. 2. Td vaccine Td vaccine can protect adolescents and adults from tetanus and diphtheria. Td is usually given as a booster dose every 10 years but it can also be given earlier after a severe and dirty wound or burn. Another vaccine, called Tdap, which protects against pertussis in addition to tetanus and diphtheria, is sometimes recommended instead of Td vaccine. Your doctor or the person giving you the vaccine can give you more information. Td may safely be given at the same time as other vaccines. 3. Some people should not get this vaccine  A person who has ever had a life-threatening allergic reaction after a previous dose of any tetanus or diphtheria containing vaccine, OR has a severe allergy to any part of this vaccine, should not get Td  vaccine. Tell the person giving the vaccine about any severe allergies.  Talk to your doctor if you:  had severe pain or swelling after any vaccine containing diphtheria or tetanus,  ever had a condition called Guillain Barre Syndrome (GBS),  aren't feeling well on the day the shot is scheduled. 4. What are the risks from Td vaccine? With any medicine, including vaccines, there is a chance of side effects. These are usually mild and go away on their own. Serious reactions are also possible but are rare. Most people who get Td vaccine do not have any problems with it. Mild problems following Td vaccine: (Did not interfere with activities)  Pain where the shot was given (about 8 people in 10)  Redness or swelling where the shot was given (about 1 person in 4)  Mild fever (rare)  Headache (about 1 person in 4)  Tiredness (about 1 person in 4) Moderate problems following Td vaccine: (Interfered with activities, but did not require medical attention)  Fever over 102F (rare) Severe problems following Td vaccine: (Unable to perform usual activities; required medical attention)  Swelling, severe pain, bleeding and/or redness in the arm where the shot was given (rare). Problems that could happen after any vaccine:  People sometimes faint after a medical procedure, including vaccination. Sitting or lying down for about 15 minutes can help prevent fainting, and injuries caused by a fall. Tell your doctor if you feel dizzy, or have vision changes or ringing in  the ears.  Some people get severe pain in the shoulder and have difficulty moving the arm where a shot was given. This happens very rarely.  Any medication can cause a severe allergic reaction. Such reactions from a vaccine are very rare, estimated at fewer than 1 in a million doses, and would happen within a few minutes to a few hours after the vaccination. As with any medicine, there is a very remote chance of a vaccine causing a  serious injury or death. The safety of vaccines is always being monitored. For more information, visit: http://www.aguilar.org/ 5. What if there is a serious reaction? What should I look for? Look for anything that concerns you, such as signs of a severe allergic reaction, very high fever, or unusual behavior. Signs of a severe allergic reaction can include hives, swelling of the face and throat, difficulty breathing, a fast heartbeat, dizziness, and weakness. These would usually start a few minutes to a few hours after the vaccination. What should I do?  If you think it is a severe allergic reaction or other emergency that can't wait, call 9-1-1 or get the person to the nearest hospital. Otherwise, call your doctor.  Afterward, the reaction should be reported to the Vaccine Adverse Event Reporting System (VAERS). Your doctor might file this report, or you can do it yourself through the VAERS web site at www.vaers.SamedayNews.es, or by calling 213-678-8088.  VAERS does not give medical advice. 6. The National Vaccine Injury Compensation Program The [employer] Vaccine Injury Compensation Program (VICP) is a federal program that was created to compensate people who may have been injured by certain vaccines. Persons who believe they may have been injured by a vaccine can learn about the program and about filing a claim by calling (606)647-1010 or visiting the [employer] website at GoldCloset.com.ee. There is a time limit to file a claim for compensation. 7. How can I learn more?  Ask your doctor. He or she can give you the vaccine package insert or suggest other sources of information.  Call your local or state health department.  Contact the Centers for Disease Control and Prevention (CDC):  Call 918-555-7432 (1-800-CDC-INFO)  Visit CDC's website at http://hunter.com/ CDC Td Vaccine VIS (07/04/15) This information is not intended to replace advice given to you by your health care  provider. Make sure you discuss any questions you have with your health care provider. Document Released: 01/06/2006 Document Revised: 11/30/2015 Document Reviewed: 11/30/2015 Elsevier Interactive Patient Education  2017 [employer].

## 2016-03-07 NOTE — Progress Notes (Signed)
Amanda Tran, is a 50 y.o. female  [id number]:[id number]  [id number]  DOB - 1965/07/09  Subjective:  Chief Complaint and HPI: Amanda Tran is a 50 y.o. female here today for trouble with sleep.  This has been going on for about 6 weeks. She has been having trouble staying asleep.  When this happens, she finds herself getting up in the middle of the night and eating until she gets sleepy again.  She denies any new stressors until she thinks on it for a while.  Her youngest son got out of prison in September.  She does admit to a fair amoun of worry and concern over him.  She also admits to overall lack of self-care/exercise/relaxation for herself.  She has 5 grown children.    She denies h/o htn to me.  She denies CP/HA/SOB.  I reviewed BPs which have been high mixed with normotensive readings over the last couple of years.  It seems her BP has increased as her weight has increased.    ROS:   Constitutional:  No f/c, No night sweats, No unexplained weight loss. EENT:  No vision changes, No blurry vision, No hearing changes. No mouth, throat, or ear problems.  Respiratory: No cough, No SOB Cardiac: No CP, no palpitations GI:  No abd pain, No N/V/D. GU: No Urinary s/sx Musculoskeletal: No joint pain Neuro: No headache, no dizziness, no motor weakness.  Skin: No rash Endocrine:  No polydipsia. No polyuria.  Psych: Denies SI/HI  No problems updated.  ALLERGIES: No Known Allergies  PAST MEDICAL HISTORY: Past Medical History:  Diagnosis Date  . DVT (deep venous thrombosis) ([place]) 03/11/2012   Left popliteal     MEDICATIONS AT HOME: Prior to Admission medications   Medication Sig Start Date End Date Taking? Authorizing Provider  aspirin 81 MG tablet Take 1 tablet (81 mg total) by mouth daily. Patient not taking: Reported on 03/07/2016 07/11/15   Boykin Nearing, MD  cetirizine (ZYRTEC) 10 MG tablet Take 1 tablet (10 mg total) by mouth daily. Patient not taking: Reported  on 03/07/2016 12/21/14   Boykin Nearing, MD  Cholecalciferol (VITAMIN D3) 2000 units TABS Take 2,000 Units by mouth daily. Patient not taking: Reported on 03/07/2016 07/11/15   Boykin Nearing, MD  ferrous sulfate 325 (65 FE) MG tablet Take 1 tablet (325 mg total) by mouth 2 (two) times daily with a meal. Patient not taking: Reported on 03/07/2016 10/06/15   Boykin Nearing, MD  traZODone (DESYREL) 50 MG tablet Take 0.5-1 tablets (25-50 mg total) by mouth at bedtime as needed for sleep. 03/07/16   Argentina Donovan, PA-C     Objective:  EXAM:   Vitals:   03/07/16 1636 03/07/16 1657  BP: (!) 148/106 132/90  Pulse: (!) 108   Resp: 16   Temp: 98.6 F (37 C)   TempSrc: Oral   SpO2: 98%   Weight: 166 lb 12.8 oz (75.7 kg)     General appearance : A&OX3. NAD. Non-toxic-appearing HEENT: Atraumatic and Normocephalic.  PERRLA. EOM intact.  TM clear B. Mouth-MMM, post pharynx WNL w/o erythema, No PND. Neck: supple, no JVD. No cervical lymphadenopathy. No thyromegaly Chest/Lungs:  Breathing-non-labored, Good air entry bilaterally, breath sounds normal without rales, rhonchi, or wheezing  CVS: S1 S2 regular, no murmurs, gallops, rubs  Abdomen: Bowel sounds present, Non tender and not distended with no gaurding, rigidity or rebound. Extremities: Bilateral Lower Ext shows no edema, both legs are warm to touch with = pulse throughout Neurology:  CN II-XII grossly intact, Non focal.   Psych:  TP linear. J/I WNL. Normal speech. Appropriate eye contact and affect.  Skin:  No Rash  Data Review Lab Results  Component Value Date   HGBA1C 5.70 12/21/2014   HGBA1C 6.1 (H) 03/23/2014   HGBA1C 5.2 04/30/2012     Assessment & Plan   1. Insomnia, unspecified type Sleep hygiene discussed.  She can try trazadone prn.  We also discussed self-care, relaxation, exercise that may help.  She also met with the social worker Christa See today for tips to help reduce stress.  - traZODone (DESYREL) 50 MG  tablet; Take 0.5-1 tablets (25-50 mg total) by mouth at bedtime as needed for sleep.  Dispense: 30 tablet; Refill: 1  2. Elevated BP without diagnosis of hypertension Check Blood pressure several times weekly and record and bring to next visit.  She has a BP cuff at home. Limit/reduce/cut out nightime eating.  Weight loss would be helpful as she was normotensive when weight was lower.    Patient have been counseled extensively about nutrition and exercise  Return in about 3 weeks (around 03/28/2016) for f/up with me for sleep issues and BP management.  Consider labs at f/up  The patient was given clear instructions to go to ER or return to medical center if symptoms don't improve, worsen or new problems develop. The patient verbalized understanding. The patient was told to call to get lab results if they haven't heard anything in the next week.     Freeman Caldron, PA-C [facility] [place], [place]   03/07/2016, 5:09 PM

## 2016-05-17 ENCOUNTER — Encounter (HOSPITAL_COMMUNITY): Payer: Self-pay

## 2016-05-17 ENCOUNTER — Ambulatory Visit (HOSPITAL_COMMUNITY)
Admission: EM | Admit: 2016-05-17 | Discharge: 2016-05-17 | Disposition: A | Discharge status: Home / Self Care | Payer: Self-pay | Attending: Family Medicine | Admitting: Family Medicine

## 2016-05-17 DIAGNOSIS — Z7982 Long term (current) use of aspirin: Secondary | ICD-10-CM | POA: Insufficient documentation

## 2016-05-17 DIAGNOSIS — F17210 Nicotine dependence, cigarettes, uncomplicated: Secondary | ICD-10-CM | POA: Insufficient documentation

## 2016-05-17 DIAGNOSIS — Z79899 Other long term (current) drug therapy: Secondary | ICD-10-CM | POA: Insufficient documentation

## 2016-05-17 DIAGNOSIS — J029 Acute pharyngitis, unspecified: Secondary | ICD-10-CM | POA: Insufficient documentation

## 2016-05-17 MED ORDER — FUROSEMIDE 40 MG PO TABS: 20.0000 mg | ORAL_TABLET | Freq: Once | ORAL | Status: DC

## 2016-05-17 MED ORDER — AMOXICILLIN 500 MG PO CAPS: 500.0000 mg | ORAL_CAPSULE | Freq: Three times a day (TID) | ORAL | 0 refills | Status: DC

## 2016-05-17 NOTE — ED Provider Notes (Signed)
CSN: [id number]     Arrival date & time 05/17/16  1825 History   None    Chief Complaint  Patient presents with  . Sore Throat   (Consider location/radiation/quality/duration/timing/severity/associated sxs/prior Treatment) The history is provided by the patient. No language interpreter was used.  Sore Throat  This is a new problem. The current episode started more than 2 days ago. The problem occurs constantly. The problem has been gradually worsening. Nothing aggravates the symptoms. Nothing relieves the symptoms. She has tried nothing for the symptoms. The treatment provided no relief.  Pt complains of a sore throat and pain with swallowing  Past Medical History:  Diagnosis Date  . DVT (deep venous thrombosis) ([place]) 03/11/2012   Left popliteal    Past Surgical History:  Procedure Laterality Date  . [place], [id number], 1996   Family History  Problem Relation Age of Onset  . Hypertension Father   . Diabetes Father    Social History  Substance Use Topics  . Smoking status: Current Every Day Smoker    Packs/day: 0.50    Years: 25.00    Types: Cigarettes  . Smokeless tobacco: Never Used  . Alcohol use No   OB History    No data available     Review of Systems  All other systems reviewed and are negative.   Allergies  Patient has no known allergies.  Home Medications   Prior to Admission medications   Medication Sig Start Date End Date Taking? Authorizing Provider  amoxicillin (AMOXIL) 500 MG capsule Take 1 capsule (500 mg total) by mouth 3 (three) times daily. 05/17/16   Fransico Meadow, PA-C  aspirin 81 MG tablet Take 1 tablet (81 mg total) by mouth daily. Patient not taking: Reported on 03/07/2016 07/11/15   Boykin Nearing, MD  cetirizine (ZYRTEC) 10 MG tablet Take 1 tablet (10 mg total) by mouth daily. Patient not taking: Reported on 03/07/2016 12/21/14   Boykin Nearing, MD  Cholecalciferol (VITAMIN D3) 2000 units TABS Take 2,000 Units by mouth  daily. Patient not taking: Reported on 03/07/2016 07/11/15   Boykin Nearing, MD  ferrous sulfate 325 (65 FE) MG tablet Take 1 tablet (325 mg total) by mouth 2 (two) times daily with a meal. Patient not taking: Reported on 03/07/2016 10/06/15   Boykin Nearing, MD  traZODone (DESYREL) 50 MG tablet Take 0.5-1 tablets (25-50 mg total) by mouth at bedtime as needed for sleep. 03/07/16   Argentina Donovan, PA-C   Meds Ordered and Administered this Visit  Medications - No data to display  BP 157/92 (BP Location: Right Arm)   Pulse 102   Temp 99.6 F (37.6 C) (Oral)   Resp 20   LMP 05/16/2016 (Exact Date)   SpO2 100%  No data found.   Physical Exam  Constitutional: She appears well-developed and well-nourished. No distress.  HENT:  Head: Normocephalic and atraumatic.  Erythema pharynx   Eyes: Conjunctivae are normal.  Neck: Neck supple.  Cardiovascular: Normal rate and regular rhythm.   No murmur heard. Pulmonary/Chest: Effort normal and breath sounds normal. No respiratory distress.  Abdominal: Soft. There is no tenderness.  Musculoskeletal: She exhibits no edema.  Neurological: She is alert.  Skin: Skin is warm and dry.  Psychiatric: She has a normal mood and affect.  Nursing note and vitals reviewed.   Urgent Care Course     Procedures (including critical care time)  Labs Review Labs Reviewed - No data to display  Imaging Review No results found.   Visual Acuity Review  Right Eye Distance:   Left Eye Distance:   Bilateral Distance:    Right Eye Near:   Left Eye Near:    Bilateral Near:         MDM   1. Pharyngitis, unspecified etiology    Meds ordered this encounter  Medications  . DISCONTD: furosemide (LASIX) tablet 20 mg  . amoxicillin (AMOXIL) 500 MG capsule    Sig: Take 1 capsule (500 mg total) by mouth 3 (three) times daily.    Dispense:  30 capsule    Refill:  0    Order Specific Question:   Supervising Provider    Answer:   Billy Fischer  [phone]   An After Visit Summary was printed and given to the patient.    [place], PA-C 05/17/16 2027

## 2016-05-17 NOTE — ED Triage Notes (Signed)
Sore throat for a few days along with chest congestion. No fever. Has taking alka seltzer plus.

## 2016-05-19 LAB — CULTURE, GROUP A STREP (THRC)

## 2016-07-02 ENCOUNTER — Ambulatory Visit (HOSPITAL_COMMUNITY)
Admission: RE | Admit: 2016-07-02 | Discharge: 2016-07-02 | Disposition: A | Discharge status: Home / Self Care | Payer: Self-pay | Source: Ambulatory Visit | Attending: Family Medicine | Admitting: Family Medicine

## 2016-07-02 ENCOUNTER — Ambulatory Visit: Payer: Self-pay | Attending: Family Medicine | Admitting: Family Medicine

## 2016-07-02 ENCOUNTER — Other Ambulatory Visit: Payer: Self-pay

## 2016-07-02 ENCOUNTER — Encounter: Payer: Self-pay | Admitting: Family Medicine

## 2016-07-02 VITALS — BP 130/85 | HR 116 | Temp 98.2°F | Ht 61.0 in | Wt 163.6 lb

## 2016-07-02 DIAGNOSIS — D500 Iron deficiency anemia secondary to blood loss (chronic): Secondary | ICD-10-CM

## 2016-07-02 DIAGNOSIS — E559 Vitamin D deficiency, unspecified: Secondary | ICD-10-CM | POA: Insufficient documentation

## 2016-07-02 DIAGNOSIS — G4700 Insomnia, unspecified: Secondary | ICD-10-CM | POA: Insufficient documentation

## 2016-07-02 DIAGNOSIS — R0602 Shortness of breath: Secondary | ICD-10-CM | POA: Insufficient documentation

## 2016-07-02 DIAGNOSIS — F17210 Nicotine dependence, cigarettes, uncomplicated: Secondary | ICD-10-CM | POA: Insufficient documentation

## 2016-07-02 DIAGNOSIS — Z113 Encounter for screening for infections with a predominantly sexual mode of transmission: Secondary | ICD-10-CM

## 2016-07-02 DIAGNOSIS — Z7982 Long term (current) use of aspirin: Secondary | ICD-10-CM | POA: Insufficient documentation

## 2016-07-02 DIAGNOSIS — R05 Cough: Secondary | ICD-10-CM | POA: Insufficient documentation

## 2016-07-02 DIAGNOSIS — R5383 Other fatigue: Secondary | ICD-10-CM | POA: Insufficient documentation

## 2016-07-02 DIAGNOSIS — F17218 Nicotine dependence, cigarettes, with other nicotine-induced disorders: Secondary | ICD-10-CM

## 2016-07-02 DIAGNOSIS — Z202 Contact with and (suspected) exposure to infections with a predominantly sexual mode of transmission: Secondary | ICD-10-CM | POA: Insufficient documentation

## 2016-07-02 DIAGNOSIS — J3089 Other allergic rhinitis: Secondary | ICD-10-CM | POA: Insufficient documentation

## 2016-07-02 DIAGNOSIS — I10 Essential (primary) hypertension: Secondary | ICD-10-CM | POA: Insufficient documentation

## 2016-07-02 DIAGNOSIS — J302 Other seasonal allergic rhinitis: Secondary | ICD-10-CM

## 2016-07-02 DIAGNOSIS — D509 Iron deficiency anemia, unspecified: Secondary | ICD-10-CM | POA: Insufficient documentation

## 2016-07-02 LAB — POCT URINALYSIS DIPSTICK
BILIRUBIN UA: NEGATIVE
GLUCOSE UA: NEGATIVE
Ketones, UA: NEGATIVE
NITRITE UA: POSITIVE
SPEC GRAV UA: 1.025 (ref 1.030–1.035)
UROBILINOGEN UA: 1 (ref ?–2.0)
pH, UA: 5.5 (ref 5.0–8.0)

## 2016-07-02 MED ORDER — TRAZODONE HCL 50 MG PO TABS: 25.0000 mg | ORAL_TABLET | Freq: Every evening | ORAL | 5 refills | Status: DC | PRN

## 2016-07-02 MED ORDER — CETIRIZINE HCL 10 MG PO TABS: 10.0000 mg | ORAL_TABLET | Freq: Every day | ORAL | 11 refills | Status: DC

## 2016-07-02 MED ORDER — VARENICLINE TARTRATE 1 MG PO TABS: 1.0000 mg | ORAL_TABLET | Freq: Two times a day (BID) | ORAL | 1 refills | Status: DC

## 2016-07-02 MED ORDER — FERROUS SULFATE 325 (65 FE) MG PO TABS: 325.0000 mg | ORAL_TABLET | Freq: Two times a day (BID) | ORAL | 1 refills | Status: DC

## 2016-07-02 MED ORDER — VARENICLINE TARTRATE 0.5 MG X 11 & 1 MG X 42 PO MISC: ORAL | 0 refills | Status: DC

## 2016-07-02 MED FILL — FERROUS SULFATE 325 MG TAB: 325 (65 FE) | 30 days supply | Qty: 60 | Fill #0

## 2016-07-02 MED FILL — traZODone HCL 50 MG TABS: 50 | 30 days supply | Qty: 30 | Fill #0

## 2016-07-02 MED FILL — ?CETIRIZINE HCL 10 MG TABLE: 10 | 30 days supply | Qty: 30 | Fill #0

## 2016-07-02 NOTE — Assessment & Plan Note (Signed)
Screening test done today

## 2016-07-02 NOTE — Progress Notes (Signed)
Subjective:  Patient ID: Amanda Tran, female    DOB: 1965/08/04  Age: 51 y.o. MRN: [id number]  CC: Shortness of Breath   HPI Kraemer is a smoker with HTN she presents for   1. Shortness of breath: started about 3 weeks ago. Fatigue. She is a smoker, she has a cough. No chest pain. No fever or chills. She is not using an inhaler. Denies blood in stool. Reports menstrual. She has history of anemia.   2. STI screen: she request STI screen today. She reports unprotected sex about 1 month ago. Her partner has been unfaithful. She denies vaginal discharge and genital sores.   Social History  Substance Use Topics  . Smoking status: Current Every Day Smoker    Packs/day: 0.50    Years: 25.00    Types: Cigarettes  . Smokeless tobacco: Never Used  . Alcohol use No    Outpatient Medications Prior to Visit  Medication Sig Dispense Refill  . cetirizine (ZYRTEC) 10 MG tablet Take 1 tablet (10 mg total) by mouth daily. 30 tablet 11  . traZODone (DESYREL) 50 MG tablet Take 0.5-1 tablets (25-50 mg total) by mouth at bedtime as needed for sleep. 30 tablet 1  . amoxicillin (AMOXIL) 500 MG capsule Take 1 capsule (500 mg total) by mouth 3 (three) times daily. (Patient not taking: Reported on 07/02/2016) 30 capsule 0  . aspirin 81 MG tablet Take 1 tablet (81 mg total) by mouth daily. (Patient not taking: Reported on 03/07/2016) 30 tablet 11  . Cholecalciferol (VITAMIN D3) 2000 units TABS Take 2,000 Units by mouth daily. (Patient not taking: Reported on 03/07/2016) 30 tablet 11  . ferrous sulfate 325 (65 FE) MG tablet Take 1 tablet (325 mg total) by mouth 2 (two) times daily with a meal. (Patient not taking: Reported on 03/07/2016) 60 tablet 1   No facility-administered medications prior to visit.     ROS Review of Systems  Constitutional: Positive for fatigue. Negative for chills and fever.  Eyes: Negative for visual disturbance.  Respiratory: Positive for shortness of  breath.   Cardiovascular: Negative for chest pain.  Gastrointestinal: Negative for abdominal pain and blood in stool.  Musculoskeletal: Negative for arthralgias and back pain.  Skin: Negative for rash.  Allergic/Immunologic: Negative for immunocompromised state.  Hematological: Negative for adenopathy. Does not bruise/bleed easily.  Psychiatric/Behavioral: Negative for dysphoric mood and suicidal ideas.    Objective:  BP 130/85   Pulse (!) 116   Temp 98.2 F (36.8 C) (Oral)   Ht 5\' 1"  (1.549 m)   Wt 163 lb 9.6 oz (74.2 kg)   LMP 06/06/2016   SpO2 100%   BMI 30.91 kg/m   BP/Weight 07/02/2016 05/17/2016 29/47/6546  Systolic BP 503 546 568  Diastolic BP 85 92 90  Wt. (Lbs) 163.6 - 166.8  BMI 30.91 - 31.52   Physical Exam  Constitutional: She is oriented to person, place, and time. She appears well-developed and well-nourished. No distress.  HENT:  Head: Normocephalic and atraumatic.  Eyes:    Cardiovascular: Regular rhythm, normal heart sounds and intact distal pulses.  Tachycardia present.   Pulmonary/Chest: Effort normal and breath sounds normal.  Musculoskeletal: She exhibits no edema.  Neurological: She is alert and oriented to person, place, and time.  Skin: Skin is warm and dry. No rash noted.  Psychiatric: She has a normal mood and affect.   EKG: normal EKG, normal sinus rhythm.  Assessment & Plan:   [place] was seen  today for shortness of breath.  Diagnoses and all orders for this visit:  Routine screening for STI (sexually transmitted infection) -     Urine cytology ancillary only -     HIV antibody (with reflex) -     RPR -     POCT urinalysis dipstick  Shortness of breath -     CBC -     Brain natriuretic peptide -     Ferritin -     Iron and TIBC -     DG Chest 2 View; Future  Vitamin D insufficiency -     Vitamin D, 25-hydroxy  Cigarette nicotine dependence with other nicotine-induced disorder -     Discontinue: varenicline (CHANTIX  CONTINUING MONTH PAK) 1 MG tablet; Take 1 tablet (1 mg total) by mouth 2 (two) times daily. -     Discontinue: varenicline (CHANTIX STARTING MONTH PAK) 0.5 MG X 11 & 1 MG X 42 tablet; Take by mouth. Taper per packet insert -     Discontinue: varenicline (CHANTIX CONTINUING MONTH PAK) 1 MG tablet; Take 1 tablet (1 mg total) by mouth 2 (two) times daily. -     Discontinue: varenicline (CHANTIX STARTING MONTH PAK) 0.5 MG X 11 & 1 MG X 42 tablet; Take by mouth. Taper per packet insert -     varenicline (CHANTIX CONTINUING MONTH PAK) 1 MG tablet; Take 1 tablet (1 mg total) by mouth 2 (two) times daily. -     varenicline (CHANTIX STARTING MONTH PAK) 0.5 MG X 11 & 1 MG X 42 tablet; Take by mouth. Taper per packet insert  Insomnia, unspecified type -     traZODone (DESYREL) 50 MG tablet; Take 0.5-1 tablets (25-50 mg total) by mouth at bedtime as needed for sleep.  Other seasonal allergic rhinitis -     Discontinue: cetirizine (ZYRTEC) 10 MG tablet; Take 1 tablet (10 mg total) by mouth daily. -     cetirizine (ZYRTEC) 10 MG tablet; Take 1 tablet (10 mg total) by mouth daily.  Microcytic anemia -     Discontinue: ferrous sulfate 325 (65 FE) MG tablet; Take 1 tablet (325 mg total) by mouth 2 (two) times daily with a meal. -     ferrous sulfate 325 (65 FE) MG tablet; Take 1 tablet (325 mg total) by mouth 2 (two) times daily with a meal.   No orders of the defined types were placed in this encounter.   Follow-up: Return in about 6 weeks (around 08/13/2016) for shortness of breath .   Boykin Nearing MD

## 2016-07-02 NOTE — Assessment & Plan Note (Signed)
A; SOB with exertion in 51 yo F with hx of smoking and anemia. Exam consistent with anemia. No evidence of infection or respiratory failure. She does have tachycardia on exam. No chest pain or PE risk factors. EKG with normal sinus rhythm.  P: CBC. Iron studies Oral iron Smoking cessation addressed CXR

## 2016-07-02 NOTE — Patient Instructions (Addendum)
Amanda Tran was seen today for shortness of breath.  Diagnoses and all orders for this visit:  Routine screening for STI (sexually transmitted infection) -     Urine cytology ancillary only -     HIV antibody (with reflex) -     RPR -     POCT urinalysis dipstick  Shortness of breath -     CBC -     Brain natriuretic peptide -     Ferritin -     Iron and TIBC -     DG Chest 2 View; Future  Vitamin D insufficiency -     Vitamin D, 25-hydroxy  Cigarette nicotine dependence with other nicotine-induced disorder -     Discontinue: varenicline (CHANTIX CONTINUING MONTH PAK) 1 MG tablet; Take 1 tablet (1 mg total) by mouth 2 (two) times daily. -     Discontinue: varenicline (CHANTIX STARTING MONTH PAK) 0.5 MG X 11 & 1 MG X 42 tablet; Take by mouth. Taper per packet insert -     varenicline (CHANTIX CONTINUING MONTH PAK) 1 MG tablet; Take 1 tablet (1 mg total) by mouth 2 (two) times daily. -     varenicline (CHANTIX STARTING MONTH PAK) 0.5 MG X 11 & 1 MG X 42 tablet; Take by mouth. Taper per packet insert  Insomnia, unspecified type -     traZODone (DESYREL) 50 MG tablet; Take 0.5-1 tablets (25-50 mg total) by mouth at bedtime as needed for sleep.  Other seasonal allergic rhinitis -     cetirizine (ZYRTEC) 10 MG tablet; Take 1 tablet (10 mg total) by mouth daily.  Microcytic anemia -     ferrous sulfate 325 (65 FE) MG tablet; Take 1 tablet (325 mg total) by mouth 2 (two) times daily with a meal.  I suspect anemia as may cause of shortness of breath  Start chantix: Take one 0.5 mg tablet by mouth once daily for 3 days, then increase to one 0.5 mg tablet twice daily for 4 days, then increase to one 1 mg tablet twice daily. Set quit date for 8-35 days after starting chantix. Smoking cessation support: smoking cessation hotline: 1-800-QUIT-NOW.  Smoking cessation classes are available through [facility]. Call [phone] or visit our website at  [email or web address]  Will wait on ordering inhaler until CXR and some possible additional lung function testing  Please complete CXR at [facility] on the first floor  You will be called with labs results  f/u in 6 weeks for shortness of breath   Dr. Adrian Blackwater

## 2016-07-03 LAB — CBC
HEMATOCRIT: 32.8 % — AB (ref 34.0–46.6)
Hemoglobin: 9.6 g/dL — ABNORMAL LOW (ref 11.1–15.9)
MCH: 20 pg — ABNORMAL LOW (ref 26.6–33.0)
MCHC: 29.3 g/dL — AB (ref 31.5–35.7)
MCV: 69 fL — ABNORMAL LOW (ref 79–97)
Platelets: 323 10*3/uL (ref 150–379)
RBC: 4.79 x10E6/uL (ref 3.77–5.28)
RDW: 19 % — AB (ref 12.3–15.4)
WBC: 5 10*3/uL (ref 3.4–10.8)

## 2016-07-03 LAB — HIV ANTIBODY (ROUTINE TESTING W REFLEX): HIV SCREEN 4TH GENERATION: NONREACTIVE

## 2016-07-03 LAB — IRON AND TIBC
IRON: 26 ug/dL — AB (ref 27–159)
Iron Saturation: 7 % — CL (ref 15–55)
Total Iron Binding Capacity: 355 ug/dL (ref 250–450)
UIBC: 329 ug/dL (ref 131–425)

## 2016-07-03 LAB — VITAMIN D 25 HYDROXY (VIT D DEFICIENCY, FRACTURES): VIT D 25 HYDROXY: 14.7 ng/mL — AB (ref 30.0–100.0)

## 2016-07-03 LAB — FERRITIN: Ferritin: 7 ng/mL — ABNORMAL LOW (ref 15–150)

## 2016-07-03 LAB — URINE CYTOLOGY ANCILLARY ONLY
CHLAMYDIA, DNA PROBE: NEGATIVE
NEISSERIA GONORRHEA: NEGATIVE
Trichomonas: NEGATIVE

## 2016-07-03 LAB — BRAIN NATRIURETIC PEPTIDE: BNP: 15.1 pg/mL (ref 0.0–100.0)

## 2016-07-03 LAB — RPR: RPR: NONREACTIVE

## 2016-07-04 ENCOUNTER — Telehealth: Payer: Self-pay

## 2016-07-04 MED ORDER — VITAMIN D (ERGOCALCIFEROL) 1.25 MG (50000 UNIT) PO CAPS: 50000.0000 [IU] | ORAL_CAPSULE | ORAL | 0 refills | Status: DC

## 2016-07-04 NOTE — Addendum Note (Signed)
Addended by: Boykin Nearing on: 07/04/2016 03:28 PM   Modules accepted: Orders

## 2016-07-04 NOTE — Assessment & Plan Note (Addendum)
A: CBC and iron studies confirm IDA P: Oral iron supplement Close f/u to follow up response to treatment IV iron if response is inadequate

## 2016-07-04 NOTE — Telephone Encounter (Signed)
Pt was called and informed of CXR results and lab results. Pt also informed of medication being sent to her pharmacy.

## 2016-08-07 ENCOUNTER — Encounter: Payer: Self-pay | Admitting: Family Medicine

## 2016-08-13 ENCOUNTER — Ambulatory Visit: Payer: Self-pay | Admitting: Family Medicine

## 2016-11-05 ENCOUNTER — Ambulatory Visit: Payer: Self-pay | Attending: Internal Medicine | Admitting: Physician Assistant

## 2016-11-05 VITALS — BP 125/89 | HR 99 | Temp 99.0°F | Resp 16 | Wt 156.0 lb

## 2016-11-05 DIAGNOSIS — Z79899 Other long term (current) drug therapy: Secondary | ICD-10-CM | POA: Insufficient documentation

## 2016-11-05 DIAGNOSIS — Z86718 Personal history of other venous thrombosis and embolism: Secondary | ICD-10-CM | POA: Insufficient documentation

## 2016-11-05 DIAGNOSIS — I10 Essential (primary) hypertension: Secondary | ICD-10-CM | POA: Insufficient documentation

## 2016-11-05 DIAGNOSIS — Z7982 Long term (current) use of aspirin: Secondary | ICD-10-CM | POA: Insufficient documentation

## 2016-11-05 DIAGNOSIS — Z131 Encounter for screening for diabetes mellitus: Secondary | ICD-10-CM | POA: Insufficient documentation

## 2016-11-05 DIAGNOSIS — R509 Fever, unspecified: Secondary | ICD-10-CM | POA: Insufficient documentation

## 2016-11-05 DIAGNOSIS — N10 Acute tubulo-interstitial nephritis: Secondary | ICD-10-CM | POA: Insufficient documentation

## 2016-11-05 LAB — POCT URINALYSIS DIPSTICK
Glucose, UA: NEGATIVE
KETONES UA: NEGATIVE
Nitrite, UA: POSITIVE
PH UA: 5.5 (ref 5.0–8.0)
PROTEIN UA: 100
SPEC GRAV UA: 1.025 (ref 1.010–1.025)
UROBILINOGEN UA: 1 U/dL

## 2016-11-05 LAB — GLUCOSE, POCT (MANUAL RESULT ENTRY): POC GLUCOSE: 107 mg/dL — AB (ref 70–99)

## 2016-11-05 MED ORDER — FLUCONAZOLE 150 MG PO TABS: 150.0000 mg | ORAL_TABLET | Freq: Once | ORAL | 0 refills | Status: AC

## 2016-11-05 MED ORDER — FLUCONAZOLE 150 MG PO TABS: 150.0000 mg | ORAL_TABLET | Freq: Once | ORAL | 0 refills | Status: DC

## 2016-11-05 MED ORDER — DOXYCYCLINE HYCLATE 100 MG PO TABS: 100.0000 mg | ORAL_TABLET | Freq: Two times a day (BID) | ORAL | 0 refills | Status: DC

## 2016-11-05 MED ORDER — CEFTRIAXONE SODIUM 1 G IJ SOLR
1.0000 g | Freq: Once | INTRAMUSCULAR | Status: AC
  Administered 2016-11-05: 1 g via INTRAMUSCULAR

## 2016-11-05 MED FILL — DOXYCYCLINE 100 MG TABLET: 100 | 10 days supply | Qty: 20 | Fill #0

## 2016-11-05 MED FILL — FLUCONAZOLE 150 MG TABLET: 150 | 1 days supply | Qty: 1 | Fill #0

## 2016-11-05 NOTE — Patient Instructions (Signed)
Pyelonephritis, Adult Pyelonephritis is a kidney infection. The kidneys are organs that help clean your blood by moving waste out of your blood and into your pee (urine). This infection can happen quickly, or it can last for a long time. In most cases, it clears up with treatment and does not cause other problems. Follow these instructions at home: Medicines  Take over-the-counter and prescription medicines only as told by your doctor.  Take your antibiotic medicine as told by your doctor. Do not stop taking the medicine even if you start to feel better. General instructions  Drink enough fluid to keep your pee clear or pale yellow.  Avoid caffeine, tea, and carbonated drinks.  Pee (urinate) often. Avoid holding in pee for long periods of time.  Pee before and after sex.  After pooping (having a bowel movement), women should wipe from front to back. Use each tissue only once.  Keep all follow-up visits as told by your doctor. This is important. Contact a doctor if:  You do not feel better after 2 days.  Your symptoms get worse.  You have a fever. Get help right away if:  You cannot take your medicine or drink fluids as told.  You have chills and shaking.  You throw up (vomit).  You have very bad pain in your side (flank) or back.  You feel very weak or you pass out (faint). This information is not intended to replace advice given to you by your health care provider. Make sure you discuss any questions you have with your health care provider. Document Released: 04/18/2004 Document Revised: 08/17/2015 Document Reviewed: 07/04/2014 Elsevier Interactive Patient Education  2018 Elsevier Inc.  

## 2016-11-05 NOTE — Progress Notes (Signed)
Flu like symtoms Fever: 103 and 104 Weakness Sx's started on Sunday morning (0100) Tylenol 500mg / Ibuprofen Denies N/V/D

## 2016-11-05 NOTE — Progress Notes (Signed)
Patient ID: Amanda Tran, female   DOB: Jul 29, 1965, 51 y.o.   MRN: [id number]   Amanda Tran, is a 51 y.o. female  DXI:[id number]  JQB:[id number]  DOB - [birth year]  Subjective:  Chief Complaint and HPI: Amanda Tran is a 51 y.o. female here today with ~48 hour h/o fevers 102-104 and slight congestion along with body aches.  No vaginal discharge. No other symptoms.  No tick bites.  No dysuria/pelvic pain/abdominal pain.  No ST.  No HA.  No vision changes.  No cough.  No runny nose or sneezing.  No rash.  No sick contacts.  Temp comes down with tylenol/advil.   she is supposed to be on Iron but hasn't been taking it for some time.  Just finished her period.    ROS:   Constitutional:  +f/c, No night sweats, No unexplained weight loss. EENT:  No vision changes, No blurry vision, No hearing changes. No mouth, throat, or ear problems.  Respiratory: No cough, No SOB Cardiac: No CP, no palpitations GI:  No abd pain, No N/V/D. GU: No Urinary s/sx Musculoskeletal: No joint pain Neuro: No headache, no dizziness, no motor weakness.  Skin: No rash Endocrine:  No polydipsia. No polyuria.  Psych: Denies SI/HI  No problems updated.  ALLERGIES: No Known Allergies  PAST MEDICAL HISTORY: Past Medical History:  Diagnosis Date  . DVT (deep venous thrombosis) ([place]) 03/11/2012   Left popliteal     MEDICATIONS AT HOME: Prior to Admission medications   Medication Sig Start Date End Date Taking? Authorizing Provider  aspirin 81 MG tablet Take 1 tablet (81 mg total) by mouth daily. Patient not taking: Reported on 03/07/2016 07/11/15   Boykin Nearing, MD  cetirizine (ZYRTEC) 10 MG tablet Take 1 tablet (10 mg total) by mouth daily. Patient not taking: Reported on 11/05/2016 07/02/16   Boykin Nearing, MD  Cholecalciferol (VITAMIN D3) 2000 units TABS Take 2,000 Units by mouth daily. Patient not taking: Reported on 03/07/2016 07/11/15   Boykin Nearing, MD  doxycycline  (VIBRA-TABS) 100 MG tablet Take 1 tablet (100 mg total) by mouth 2 (two) times daily. 11/05/16   Argentina Donovan, PA-C  ferrous sulfate 325 (65 FE) MG tablet Take 1 tablet (325 mg total) by mouth 2 (two) times daily with a meal. Patient not taking: Reported on 11/05/2016 07/02/16   Boykin Nearing, MD  fluconazole (DIFLUCAN) 150 MG tablet Take 1 tablet (150 mg total) by mouth once. If needed for yeast infection 11/05/16 11/05/16  Argentina Donovan, PA-C  traZODone (DESYREL) 50 MG tablet Take 0.5-1 tablets (25-50 mg total) by mouth at bedtime as needed for sleep. Patient not taking: Reported on 11/05/2016 07/02/16   Boykin Nearing, MD  varenicline (CHANTIX CONTINUING MONTH PAK) 1 MG tablet Take 1 tablet (1 mg total) by mouth 2 (two) times daily. Patient not taking: Reported on 11/05/2016 07/02/16   Boykin Nearing, MD  varenicline (CHANTIX STARTING MONTH PAK) 0.5 MG X 11 & 1 MG X 42 tablet Take by mouth. Taper per packet insert Patient not taking: Reported on 11/05/2016 07/02/16   Boykin Nearing, MD  Vitamin D, Ergocalciferol, (DRISDOL) 50000 units CAPS capsule Take 1 capsule (50,000 Units total) by mouth every 7 (seven) days. For 12 weeks Patient not taking: Reported on 11/05/2016 07/04/16   Boykin Nearing, MD     Objective:  EXAM:   Vitals:   11/05/16 1340  BP: 125/89  Pulse: 99  Resp: 16  Temp: 99 F (37.2 C)  TempSrc: Oral  SpO2: 100%  Weight: 156 lb (70.8 kg)    General appearance : A&OX3. NAD. Non-toxic-appearing, but does appear ill HEENT: Atraumatic and Normocephalic.  PERRLA. EOM intact.  TM clear B. Mouth-MMM, post pharynx WNL w/o erythema, No PND. Neck: supple, no JVD. No cervical lymphadenopathy. No thyromegaly Chest/Lungs:  Breathing-non-labored, Good air entry bilaterally, breath sounds normal without rales, rhonchi, or wheezing  CVS: S1 S2 regular, no murmurs, gallops, rubs  Abdomen: Bowel sounds present, Non tender and not distended with no gaurding, rigidity or  rebound.  Mild CVA TTP Extremities: Bilateral Lower Ext shows no edema, both legs are warm to touch with = pulse throughout Neurology:  CN II-XII grossly intact, Non focal.   Psych:  TP linear. J/I WNL. Normal speech. Appropriate eye contact and affect.  Skin:  No Rash  Data Review Lab Results  Component Value Date   HGBA1C 5.70 12/21/2014   HGBA1C 6.1 (H) 03/23/2014   HGBA1C 5.2 04/30/2012     Assessment & Plan   1. Fever, unspecified fever cause(see #4) Continue to treat with advil/tylenol.  Drink a lot of water.   - Urinalysis Dipstick - CBC with Differential/Platelet - Urine cytology ancillary only - Urine Culture  2. Screening for diabetes mellitus Not diabetic - Glucose (CBG)  3. Essential hypertension Adequate control-will reassess at follow-up  4. Pyelonephritis, acute Will cover for STI/ and UTI as etiology-UA nitrite and leukocyte + - CBC with Differential/Platelet - Urine cytology ancillary only - Urine Culture - cefTRIAXone (ROCEPHIN) injection 1 g; Inject 1 g into the muscle once. - doxycycline (VIBRA-TABS) 100 MG tablet; Take 1 tablet (100 mg total) by mouth 2 (two) times daily.  Dispense: 20 tablet; Refill: 0 - fluconazole (DIFLUCAN) 150 MG tablet; Take 1 tablet (150 mg total) by mouth once. If needed for yeast infection  Dispense: 1 tablet; Refill: 0  Patient have been counseled extensively about nutrition and exercise  Return in about 1 week (around 11/12/2016) for assign new PCP and recheck pyelonephritis.  The patient was given clear instructions to go to ER or return to medical center if symptoms don't improve, worsen or new problems develop. The patient verbalized understanding. The patient was told to call to get lab results if they haven't heard anything in the next week.    Freeman Caldron, PA-C [facility] and [place], [place]   11/05/2016, 2:21 PM

## 2016-11-06 LAB — CBC WITH DIFFERENTIAL/PLATELET
Basophils Absolute: 0 10*3/uL (ref 0.0–0.2)
Basos: 0 %
EOS (ABSOLUTE): 0.1 10*3/uL (ref 0.0–0.4)
EOS: 1 %
HEMATOCRIT: 33 % — AB (ref 34.0–46.6)
HEMOGLOBIN: 10.2 g/dL — AB (ref 11.1–15.9)
Immature Grans (Abs): 0 10*3/uL (ref 0.0–0.1)
Immature Granulocytes: 0 %
Lymphocytes Absolute: 1.5 10*3/uL (ref 0.7–3.1)
Lymphs: 16 %
MCH: 22 pg — ABNORMAL LOW (ref 26.6–33.0)
MCHC: 30.9 g/dL — AB (ref 31.5–35.7)
MCV: 71 fL — ABNORMAL LOW (ref 79–97)
Monocytes Absolute: 0.8 10*3/uL (ref 0.1–0.9)
Monocytes: 9 %
NEUTROS ABS: 6.8 10*3/uL (ref 1.4–7.0)
Neutrophils: 74 %
Platelets: 368 10*3/uL (ref 150–379)
RBC: 4.63 x10E6/uL (ref 3.77–5.28)
RDW: 20.8 % — ABNORMAL HIGH (ref 12.3–15.4)
WBC: 9.2 10*3/uL (ref 3.4–10.8)

## 2016-11-07 LAB — URINE CYTOLOGY ANCILLARY ONLY
CHLAMYDIA, DNA PROBE: NEGATIVE
NEISSERIA GONORRHEA: NEGATIVE
TRICH (WINDOWPATH): NEGATIVE

## 2016-11-08 LAB — URINE CYTOLOGY ANCILLARY ONLY: Candida vaginitis: NEGATIVE

## 2016-11-08 LAB — URINE CULTURE

## 2016-11-11 ENCOUNTER — Other Ambulatory Visit: Payer: Self-pay | Admitting: Physician Assistant

## 2016-11-11 MED ORDER — FLUCONAZOLE 150 MG PO TABS: ORAL_TABLET | ORAL | 0 refills | Status: DC

## 2016-11-11 MED ORDER — METRONIDAZOLE 500 MG PO TABS: 500.0000 mg | ORAL_TABLET | Freq: Two times a day (BID) | ORAL | 0 refills | Status: DC

## 2016-11-13 ENCOUNTER — Encounter: Payer: Self-pay | Admitting: *Deleted

## 2016-12-26 ENCOUNTER — Ambulatory Visit: Payer: Self-pay | Admitting: Family Medicine

## 2017-02-20 ENCOUNTER — Telehealth: Payer: Self-pay | Admitting: Internal Medicine

## 2017-02-25 MED FILL — traZODone HCL 50 MG TABS: 50 | 30 days supply | Qty: 30 | Fill #1

## 2017-02-26 ENCOUNTER — Other Ambulatory Visit: Payer: Self-pay

## 2017-02-26 ENCOUNTER — Ambulatory Visit: Payer: Self-pay | Attending: Internal Medicine | Admitting: Physician Assistant

## 2017-02-26 VITALS — BP 141/95 | HR 89 | Temp 98.7°F | Resp 16 | Ht 60.0 in | Wt 167.0 lb

## 2017-02-26 DIAGNOSIS — Z111 Encounter for screening for respiratory tuberculosis: Secondary | ICD-10-CM

## 2017-02-26 DIAGNOSIS — Z23 Encounter for immunization: Secondary | ICD-10-CM

## 2017-02-26 DIAGNOSIS — R030 Elevated blood-pressure reading, without diagnosis of hypertension: Secondary | ICD-10-CM | POA: Insufficient documentation

## 2017-02-26 DIAGNOSIS — J302 Other seasonal allergic rhinitis: Secondary | ICD-10-CM | POA: Insufficient documentation

## 2017-02-26 DIAGNOSIS — G4700 Insomnia, unspecified: Secondary | ICD-10-CM | POA: Insufficient documentation

## 2017-02-26 DIAGNOSIS — D500 Iron deficiency anemia secondary to blood loss (chronic): Secondary | ICD-10-CM | POA: Insufficient documentation

## 2017-02-26 MED ORDER — CETIRIZINE HCL 10 MG PO TABS: 10.0000 mg | ORAL_TABLET | Freq: Every day | ORAL | 11 refills | Status: DC

## 2017-02-26 MED ORDER — TRAZODONE HCL 50 MG PO TABS: 25.0000 mg | ORAL_TABLET | Freq: Every evening | ORAL | 5 refills | Status: DC | PRN

## 2017-02-26 MED ORDER — FLUTICASONE PROPIONATE 50 MCG/ACT NA SUSP: 2.0000 | Freq: Every day | NASAL | 6 refills | Status: DC

## 2017-02-26 MED FILL — ?CETIRIZINE HCL 10 MG TABLE: 10 | 30 days supply | Qty: 30 | Fill #0

## 2017-02-26 MED FILL — FLUTICASONE PROP 50 MCG SPR: 50 | 30 days supply | Qty: 16 | Fill #0

## 2017-02-26 NOTE — Progress Notes (Signed)
Amanda Tran, is a 51 y.o. female  WNU:[id number]  [id number]  DOB - 1965-07-10  Subjective:  Chief Complaint and HPI: Amanda Tran is a 51 y.o. female here today for RF on insomnia meds.  She takes trazadone which works well and has been out for a few weeks.  She would also like to get a TB test while she is here.  She also is having some nasal congestion and allergies.  No f/c.  No cough.  No night sweats/hemoptysis.  She has been checking her BP OOO and says most of her readings have been close to normal ~120/80.    ROS:   Constitutional:  No f/c, No night sweats, No unexplained weight loss. EENT:  No vision changes, No blurry vision, No hearing changes. No other mouth, throat, or ear problems other than stated above.   Respiratory: No cough, No SOB Cardiac: No CP, no palpitations GI:  No abd pain, No N/V/D. GU: No Urinary s/sx Musculoskeletal: No joint pain Neuro: No headache, no dizziness, no motor weakness.  Skin: No rash Endocrine:  No polydipsia. No polyuria.  Psych: Denies SI/HI  No problems updated.  ALLERGIES: No Known Allergies  PAST MEDICAL HISTORY: Past Medical History:  Diagnosis Date  . DVT (deep venous thrombosis) ([place]) 03/11/2012   Left popliteal     MEDICATIONS AT HOME: Prior to Admission medications   Medication Sig Start Date End Date Taking? Authorizing Provider  aspirin 81 MG tablet Take 1 tablet (81 mg total) by mouth daily. Patient not taking: Reported on 03/07/2016 07/11/15   Boykin Nearing, MD  cetirizine (ZYRTEC) 10 MG tablet Take 1 tablet (10 mg total) by mouth daily. 02/26/17   Argentina Donovan, PA-C  Cholecalciferol (VITAMIN D3) 2000 units TABS Take 2,000 Units by mouth daily. Patient not taking: Reported on 03/07/2016 07/11/15   Boykin Nearing, MD  ferrous sulfate 325 (65 FE) MG tablet Take 1 tablet (325 mg total) by mouth 2 (two) times daily with a meal. Patient not taking: Reported on 11/05/2016 07/02/16   Boykin Nearing, MD  fluticasone (FLONASE) 50 MCG/ACT nasal spray Place 2 sprays into both nostrils daily. 02/26/17   Argentina Donovan, PA-C  metroNIDAZOLE (FLAGYL) 500 MG tablet Take 1 tablet (500 mg total) by mouth 2 (two) times daily. Patient not taking: Reported on 02/26/2017 11/11/16   Argentina Donovan, PA-C  traZODone (DESYREL) 50 MG tablet Take 0.5-1 tablets (25-50 mg total) by mouth at bedtime as needed for sleep. 02/26/17   Argentina Donovan, PA-C  varenicline (CHANTIX CONTINUING MONTH PAK) 1 MG tablet Take 1 tablet (1 mg total) by mouth 2 (two) times daily. Patient not taking: Reported on 11/05/2016 07/02/16   Boykin Nearing, MD  varenicline (CHANTIX STARTING MONTH PAK) 0.5 MG X 11 & 1 MG X 42 tablet Take by mouth. Taper per packet insert Patient not taking: Reported on 11/05/2016 07/02/16   Boykin Nearing, MD  Vitamin D, Ergocalciferol, (DRISDOL) 50000 units CAPS capsule Take 1 capsule (50,000 Units total) by mouth every 7 (seven) days. For 12 weeks Patient not taking: Reported on 11/05/2016 07/04/16   Boykin Nearing, MD     Objective:  EXAM:   Vitals:   02/26/17 1348  BP: (!) 141/95  Pulse: 89  Resp: 16  Temp: 98.7 F (37.1 C)  TempSrc: Oral  SpO2: 100%  Weight: 167 lb (75.8 kg)  Height: 5' (1.524 m)    General appearance : A&OX3. NAD. Non-toxic-appearing HEENT: Atraumatic and Normocephalic.  PERRLA. EOM intact.  TM clear B. Mouth-MMM, post pharynx WNL w/o erythema, No PND. + nasal congestion Neck: supple, no JVD. No cervical lymphadenopathy. No thyromegaly Chest/Lungs:  Breathing-non-labored, Good air entry bilaterally, breath sounds normal without rales, rhonchi, or wheezing  CVS: S1 S2 regular, no murmurs, gallops, rubs  Extremities: Bilateral Lower Ext shows no edema, both legs are warm to touch with = pulse throughout Neurology:  CN II-XII grossly intact, Non focal.   Psych:  TP linear. J/I WNL. Normal speech. Appropriate eye contact and affect.  Skin:  No Rash  Data  Review Lab Results  Component Value Date   HGBA1C 5.70 12/21/2014   HGBA1C 6.1 (H) 03/23/2014   HGBA1C 5.2 04/30/2012     Assessment & Plan   1. Insomnia, unspecified type - traZODone (DESYREL) 50 MG tablet; Take 0.5-1 tablets (25-50 mg total) by mouth at bedtime as needed for sleep.  Dispense: 30 tablet; Refill: 5  2. Other seasonal allergic rhinitis - fluticasone (FLONASE) 50 MCG/ACT nasal spray; Place 2 sprays into both nostrils daily.  Dispense: 16 g; Refill: 6 - cetirizine (ZYRTEC) 10 MG tablet; Take 1 tablet (10 mg total) by mouth daily.  Dispense: 30 tablet; Refill: 11  3. Iron deficiency anemia due to chronic blood loss Not taking Iron - CBC with Differential/Platelet - Iron, TIBC and Ferritin Panel  4. Elevated BP without diagnosis of hypertension Continue to check BP OOO and record and bring to next visit.    5. Screening-pulmonary TB - TB Skin Test Return bt 2:30-5:30pm on Friday, December 7th, 2018 for TB reading     Patient have been counseled extensively about nutrition and exercise  Return in about 2 months (around 04/29/2017) for assign new PCP; f/up elevated BP.  The patient was given clear instructions to go to ER or return to medical center if symptoms don't improve, worsen or new problems develop. The patient verbalized understanding. The patient was told to call to get lab results if they haven't heard anything in the next week.     Freeman Caldron, PA-C [facility] and [place], [place]   02/26/2017, 2:16 PMPatient ID: Amanda Tran, female   DOB: 1965-11-06, 51 y.o.   MRN: [id number]

## 2017-02-26 NOTE — Progress Notes (Signed)
Med. RF and tb test

## 2017-02-26 NOTE — Patient Instructions (Signed)
Return bt 2:30-5:30pm on Friday, December 7th, 2018 for TB reading

## 2017-02-27 LAB — CBC WITH DIFFERENTIAL/PLATELET
BASOS: 0 %
Basophils Absolute: 0 10*3/uL (ref 0.0–0.2)
EOS (ABSOLUTE): 0.2 10*3/uL (ref 0.0–0.4)
Eos: 3 %
Hematocrit: 36.2 % (ref 34.0–46.6)
Hemoglobin: 11.3 g/dL (ref 11.1–15.9)
IMMATURE GRANULOCYTES: 0 %
Immature Grans (Abs): 0 10*3/uL (ref 0.0–0.1)
LYMPHS ABS: 2.3 10*3/uL (ref 0.7–3.1)
Lymphs: 41 %
MCH: 24.2 pg — ABNORMAL LOW (ref 26.6–33.0)
MCHC: 31.2 g/dL — AB (ref 31.5–35.7)
MCV: 78 fL — AB (ref 79–97)
MONOS ABS: 0.4 10*3/uL (ref 0.1–0.9)
Monocytes: 7 %
NEUTROS PCT: 49 %
Neutrophils Absolute: 2.7 10*3/uL (ref 1.4–7.0)
PLATELETS: 335 10*3/uL (ref 150–379)
RBC: 4.67 x10E6/uL (ref 3.77–5.28)
RDW: 16.1 % — AB (ref 12.3–15.4)
WBC: 5.6 10*3/uL (ref 3.4–10.8)

## 2017-02-27 LAB — IRON,TIBC AND FERRITIN PANEL
Ferritin: 8 ng/mL — ABNORMAL LOW (ref 15–150)
IRON: 29 ug/dL (ref 27–159)
Iron Saturation: 8 % — CL (ref 15–55)
Total Iron Binding Capacity: 379 ug/dL (ref 250–450)
UIBC: 350 ug/dL (ref 131–425)

## 2017-02-28 ENCOUNTER — Ambulatory Visit: Payer: Self-pay | Attending: Family Medicine | Admitting: *Deleted

## 2017-02-28 DIAGNOSIS — Z111 Encounter for screening for respiratory tuberculosis: Secondary | ICD-10-CM | POA: Insufficient documentation

## 2017-02-28 NOTE — Progress Notes (Signed)
PPD Reading Note PPD read and results entered in [facility]. Result: 0 mm induration. Interpretation: NEGATIVE IAllergic reaction: no

## 2017-05-08 ENCOUNTER — Ambulatory Visit: Payer: Self-pay | Attending: Family Medicine | Admitting: Family Medicine

## 2017-05-08 ENCOUNTER — Encounter: Payer: Self-pay | Admitting: Family Medicine

## 2017-05-08 VITALS — BP 134/94 | HR 94 | Temp 98.0°F | Resp 16 | Ht 61.0 in | Wt 165.0 lb

## 2017-05-08 DIAGNOSIS — R1084 Generalized abdominal pain: Secondary | ICD-10-CM

## 2017-05-08 DIAGNOSIS — B009 Herpesviral infection, unspecified: Secondary | ICD-10-CM | POA: Insufficient documentation

## 2017-05-08 DIAGNOSIS — Z79899 Other long term (current) drug therapy: Secondary | ICD-10-CM | POA: Insufficient documentation

## 2017-05-08 DIAGNOSIS — Z7982 Long term (current) use of aspirin: Secondary | ICD-10-CM | POA: Insufficient documentation

## 2017-05-08 DIAGNOSIS — N926 Irregular menstruation, unspecified: Secondary | ICD-10-CM

## 2017-05-08 DIAGNOSIS — N898 Other specified noninflammatory disorders of vagina: Secondary | ICD-10-CM

## 2017-05-08 DIAGNOSIS — R102 Pelvic and perineal pain: Secondary | ICD-10-CM | POA: Insufficient documentation

## 2017-05-08 DIAGNOSIS — F17210 Nicotine dependence, cigarettes, uncomplicated: Secondary | ICD-10-CM | POA: Insufficient documentation

## 2017-05-08 DIAGNOSIS — Z13220 Encounter for screening for lipoid disorders: Secondary | ICD-10-CM

## 2017-05-08 DIAGNOSIS — Z113 Encounter for screening for infections with a predominantly sexual mode of transmission: Secondary | ICD-10-CM

## 2017-05-08 DIAGNOSIS — N3001 Acute cystitis with hematuria: Secondary | ICD-10-CM

## 2017-05-08 DIAGNOSIS — I10 Essential (primary) hypertension: Secondary | ICD-10-CM

## 2017-05-08 DIAGNOSIS — F17200 Nicotine dependence, unspecified, uncomplicated: Secondary | ICD-10-CM

## 2017-05-08 DIAGNOSIS — M549 Dorsalgia, unspecified: Secondary | ICD-10-CM | POA: Insufficient documentation

## 2017-05-08 DIAGNOSIS — N912 Amenorrhea, unspecified: Secondary | ICD-10-CM | POA: Insufficient documentation

## 2017-05-08 LAB — POCT URINALYSIS DIPSTICK
Appearance: NORMAL
Bilirubin, UA: NEGATIVE
Glucose, UA: NEGATIVE
KETONES UA: NEGATIVE
Nitrite, UA: NEGATIVE
PH UA: 7 (ref 5.0–8.0)
PROTEIN UA: 30
SPEC GRAV UA: 1.025 (ref 1.010–1.025)
Urobilinogen, UA: 1 E.U./dL

## 2017-05-08 LAB — POCT URINE PREGNANCY: Preg Test, Ur: NEGATIVE

## 2017-05-08 MED ORDER — AMLODIPINE BESYLATE 2.5 MG PO TABS: 2.5000 mg | ORAL_TABLET | Freq: Every day | ORAL | 2 refills | Status: DC

## 2017-05-08 MED ORDER — BUPROPION HCL ER (SR) 150 MG PO TB12: 150.0000 mg | ORAL_TABLET | Freq: Two times a day (BID) | ORAL | 2 refills | Status: DC

## 2017-05-08 MED ORDER — NITROFURANTOIN MONOHYD MACRO 100 MG PO CAPS: 100.0000 mg | ORAL_CAPSULE | Freq: Two times a day (BID) | ORAL | 0 refills | Status: DC

## 2017-05-08 MED FILL — BUPROPION SR 150 MG TABLET: 150 | 30 days supply | Qty: 60 | Fill #0

## 2017-05-08 MED FILL — ?NITROFURANTOIN-MACRO 100 M: 100 | 5 days supply | Qty: 10 | Fill #0

## 2017-05-08 MED FILL — AMLODIPINE BESYLATE 2.5 MG: 2.5 | 30 days supply | Qty: 30 | Fill #0

## 2017-05-08 NOTE — Progress Notes (Signed)
Subjective:  Patient ID: Amanda Tran, female    DOB: 04-09-1965  Age: 52 y.o. MRN: [id number]  CC: Establish Care   HPI [name] presents to establish care. She c/o abnormal vaginal odor.  Onset 2 weeks ago. Associated symptoms include mild cramping, back pain, and pink tinged urine with wiping. She denies any dysuria, discharge, or vaginal lesions. She is agreeable to STI screening at this time. She reports 2 sexual partners within the last 3 months. She reports late menstrual cycle and is requesting pregnancy test. History of elevated BP's.She is not exercising and is not adherent to low salt diet. Cardiac symptoms none. Cardiovascular risk factors: sedentary lifestyle and smoking exposure. She reports smoking 1/2 a pack per day. She  is ready to quit smoking at this time. Use of agents associated with hypertension: none.  History of target organ damage: none.   Outpatient Medications Prior to Visit  Medication Sig Dispense Refill  . aspirin 81 MG tablet Take 1 tablet (81 mg total) by mouth daily. (Patient not taking: Reported on 03/07/2016) 30 tablet 11  . cetirizine (ZYRTEC) 10 MG tablet Take 1 tablet (10 mg total) by mouth daily. (Patient not taking: Reported on 02/26/2017) 30 tablet 11  . Cholecalciferol (VITAMIN D3) 2000 units TABS Take 2,000 Units by mouth daily. (Patient not taking: Reported on 03/07/2016) 30 tablet 11  . ferrous sulfate 325 (65 FE) MG tablet Take 1 tablet (325 mg total) by mouth 2 (two) times daily with a meal. (Patient not taking: Reported on 11/05/2016) 60 tablet 1  . fluticasone (FLONASE) 50 MCG/ACT nasal spray Place 2 sprays into both nostrils daily. (Patient not taking: Reported on 05/08/2017) 16 g 6  . traZODone (DESYREL) 50 MG tablet Take 0.5-1 tablets (25-50 mg total) by mouth at bedtime as needed for sleep. (Patient not taking: Reported on 05/08/2017) 30 tablet 5  . Vitamin D, Ergocalciferol, (DRISDOL) 50000 units CAPS capsule Take 1 capsule  (50,000 Units total) by mouth every 7 (seven) days. For 12 weeks (Patient not taking: Reported on 11/05/2016) 12 capsule 0  . metroNIDAZOLE (FLAGYL) 500 MG tablet Take 1 tablet (500 mg total) by mouth 2 (two) times daily. (Patient not taking: Reported on 02/26/2017) 14 tablet 0  . varenicline (CHANTIX CONTINUING MONTH PAK) 1 MG tablet Take 1 tablet (1 mg total) by mouth 2 (two) times daily. (Patient not taking: Reported on 11/05/2016) 60 tablet 1  . varenicline (CHANTIX STARTING MONTH PAK) 0.5 MG X 11 & 1 MG X 42 tablet Take by mouth. Taper per packet insert (Patient not taking: Reported on 11/05/2016) 53 tablet 0   No facility-administered medications prior to visit.     ROS Review of Systems  Constitutional: Negative.   Respiratory: Negative.   Cardiovascular: Negative.   Gastrointestinal: Positive for abdominal pain.  Genitourinary: Positive for hematuria, menstrual problem and pelvic pain.       Vaginal problem.  Musculoskeletal: Positive for back pain.  Skin: Negative.    Objective:  BP (!) 134/94 (BP Location: Left Arm, Patient Position: Sitting, Cuff Size: Normal)   Pulse 94   Temp 98 F (36.7 C) (Oral)   Resp 16   Ht 5\' 1"  (1.549 m)   Wt 165 lb (74.8 kg)   SpO2 100%   BMI 31.18 kg/m   BP/Weight 05/08/2017 02/26/2017 1/32/4401  Systolic BP 027 253 664  Diastolic BP 94 95 89  Wt. (Lbs) 165 167 156  BMI 31.18 32.61 29.48  Physical Exam  Constitutional: She appears well-developed and well-nourished.  Eyes: Conjunctivae are normal. Pupils are equal, round, and reactive to light.  Neck: No JVD present.  Cardiovascular: Normal rate, regular rhythm, normal heart sounds and intact distal pulses.  Pulmonary/Chest: Effort normal and breath sounds normal.  Abdominal: Soft. Bowel sounds are normal. There is tenderness.  Musculoskeletal: Normal range of motion.  Skin: Skin is warm and dry.  Nursing note and vitals reviewed.    Assessment & Plan:   1. Essential  hypertension  - CMP and Liver - CBC - amLODipine (NORVASC) 2.5 MG tablet; Take 1 tablet (2.5 mg total) by mouth daily.  Dispense: 30 tablet; Refill: 2  2. Vaginal odor  - Urine cytology ancillary only  3. Screening cholesterol level  - Lipid Panel  4. Ready to quit smoking  - buPROPion (WELLBUTRIN SR) 150 MG 12 hr tablet; Take 1 tablet (150 mg total) by mouth 2 (two) times daily.  Dispense: 60 tablet; Refill: 2  5. Missed menses  - POCT urine pregnancy  6. Screening examination for STD (sexually transmitted disease)  - HSV(herpes simplex vrs) 1+2 ab-IgG - HEP, RPR, HIV Panel - Urine cytology ancillary only  7. Generalized abdominal pain  - Urinalysis Dipstick  8. Acute cystitis with hematuria Urine dip + for WBC/blood. - nitrofurantoin, macrocrystal-monohydrate, (MACROBID) 100 MG capsule; Take 1 capsule (100 mg total) by mouth 2 (two) times daily.  Dispense: 10 capsule; Refill: 0      Follow-up: Return in about 2 weeks (around 05/22/2017) for HTN .   Alfonse Spruce FNP

## 2017-05-08 NOTE — Progress Notes (Signed)
Patient is here to establish care.  Patient stated she have not have her cycle yet and think she may have a bacterial infection due to a strong odors for 2 weeks now. Request pregnancy test.  Patient stated she have a mild cramping and back pain, sometimes when she wipes she see a little pink blood.

## 2017-05-08 NOTE — Patient Instructions (Addendum)
Managing Your Hypertension Hypertension is commonly called high blood pressure. This is when the force of your blood pressing against the walls of your arteries is too strong. Arteries are blood vessels that carry blood from your heart throughout your body. Hypertension forces the heart to work harder to pump blood, and may cause the arteries to become narrow or stiff. Having untreated or uncontrolled hypertension can cause heart attack, stroke, kidney disease, and other problems. What are blood pressure readings? A blood pressure reading consists of a higher number over a lower number. Ideally, your blood pressure should be below 120/80. The first ("top") number is called the systolic pressure. It is a measure of the pressure in your arteries as your heart beats. The second ("bottom") number is called the diastolic pressure. It is a measure of the pressure in your arteries as the heart relaxes. What does my blood pressure reading mean? Blood pressure is classified into four stages. Based on your blood pressure reading, your health care provider may use the following stages to determine what type of treatment you need, if any. Systolic pressure and diastolic pressure are measured in a unit called mm Hg. Normal  Systolic pressure: below 120.  Diastolic pressure: below 80. Elevated  Systolic pressure: 120-129.  Diastolic pressure: below 80. Hypertension stage 1  Systolic pressure: 130-139.  Diastolic pressure: 80-89. Hypertension stage 2  Systolic pressure: 140 or above.  Diastolic pressure: 90 or above. What health risks are associated with hypertension? Managing your hypertension is an important responsibility. Uncontrolled hypertension can lead to:  A heart attack.  A stroke.  A weakened blood vessel (aneurysm).  Heart failure.  Kidney damage.  Eye damage.  Metabolic syndrome.  Memory and concentration problems.  What changes can I make to manage my  hypertension? Hypertension can be managed by making lifestyle changes and possibly by taking medicines. Your health care provider will help you make a plan to bring your blood pressure within a normal range. Eating and drinking  Eat a diet that is high in fiber and potassium, and low in salt (sodium), added sugar, and fat. An example eating plan is called the DASH (Dietary Approaches to Stop Hypertension) diet. To eat this way: ? Eat plenty of fresh fruits and vegetables. Try to fill half of your plate at each meal with fruits and vegetables. ? Eat whole grains, such as whole wheat pasta, brown rice, or whole grain bread. Fill about one quarter of your plate with whole grains. ? Eat low-fat diary products. ? Avoid fatty cuts of meat, processed or cured meats, and poultry with skin. Fill about one quarter of your plate with lean proteins such as fish, chicken without skin, beans, eggs, and tofu. ? Avoid premade and processed foods. These tend to be higher in sodium, added sugar, and fat.  Reduce your daily sodium intake. Most people with hypertension should eat less than 1,500 mg of sodium a day.  Limit alcohol intake to no more than 1 drink a day for nonpregnant women and 2 drinks a day for men. One drink equals 12 oz of beer, 5 oz of wine, or 1 oz of hard liquor. Lifestyle  Work with your health care provider to maintain a healthy body weight, or to lose weight. Ask what an ideal weight is for you.  Get at least 30 minutes of exercise that causes your heart to beat faster (aerobic exercise) most days of the week. Activities may include walking, swimming, or biking.  Include exercise   to strengthen your muscles (resistance exercise), such as weight lifting, as part of your weekly exercise routine. Try to do these types of exercises for 30 minutes at least 3 days a week.  Do not use any products that contain nicotine or tobacco, such as cigarettes and e-cigarettes. If you need help quitting, ask  your health care provider.  Control any long-term (chronic) conditions you have, such as high cholesterol or diabetes. Monitoring  Monitor your blood pressure at home as told by your health care provider. Your personal target blood pressure may vary depending on your medical conditions, your age, and other factors.  Have your blood pressure checked regularly, as often as told by your health care provider. Working with your health care provider  Review all the medicines you take with your health care provider because there may be side effects or interactions.  Talk with your health care provider about your diet, exercise habits, and other lifestyle factors that may be contributing to hypertension.  Visit your health care provider regularly. Your health care provider can help you create and adjust your plan for managing hypertension. Will I need medicine to control my blood pressure? Your health care provider may prescribe medicine if lifestyle changes are not enough to get your blood pressure under control, and if:  Your systolic blood pressure is 130 or higher.  Your diastolic blood pressure is 80 or higher.  Take medicines only as told by your health care provider. Follow the directions carefully. Blood pressure medicines must be taken as prescribed. The medicine does not work as well when you skip doses. Skipping doses also puts you at risk for problems. Contact a health care provider if:  You think you are having a reaction to medicines you have taken.  You have repeated (recurrent) headaches.  You feel dizzy.  You have swelling in your ankles.  You have trouble with your vision. Get help right away if:  You develop a severe headache or confusion.  You have unusual weakness or numbness, or you feel faint.  You have severe pain in your chest or abdomen.  You vomit repeatedly.  You have trouble breathing. Summary  Hypertension is when the force of blood pumping through  your arteries is too strong. If this condition is not controlled, it may put you at risk for serious complications.  Your personal target blood pressure may vary depending on your medical conditions, your age, and other factors. For most people, a normal blood pressure is less than 120/80.  Hypertension is managed by lifestyle changes, medicines, or both. Lifestyle changes include weight loss, eating a healthy, low-sodium diet, exercising more, and limiting alcohol. This information is not intended to replace advice given to you by your health care provider. Make sure you discuss any questions you have with your health care provider. Document Released: 12/04/2011 Document Revised: 02/07/2016 Document Reviewed: 02/07/2016 Elsevier Interactive Patient Education  2018 [employer].    Urinary Tract Infection, Adult A urinary tract infection (UTI) is an infection of any part of the urinary tract. The urinary tract includes the:  Kidneys.  Ureters.  Bladder.  Urethra.  These organs make, store, and get rid of pee (urine) in the body. Follow these instructions at home:  Take over-the-counter and prescription medicines only as told by your doctor.  If you were prescribed an antibiotic medicine, take it as told by your doctor. Do not stop taking the antibiotic even if you start to feel better.  Avoid the following  drinks: ? Alcohol. ? Caffeine. ? Tea. ? Carbonated drinks.  Drink enough fluid to keep your pee clear or pale yellow.  Keep all follow-up visits as told by your doctor. This is important.  Make sure to: ? Empty your bladder often and completely. Do not to hold pee for long periods of time. ? Empty your bladder before and after sex. ? Wipe from front to back after a bowel movement if you are female. Use each tissue one time when you wipe. Contact a doctor if:  You have back pain.  You have a fever.  You feel sick to your stomach (nauseous).  You throw up  (vomit).  Your symptoms do not get better after 3 days.  Your symptoms go away and then come back. Get help right away if:  You have very bad back pain.  You have very bad lower belly (abdominal) pain.  You are throwing up and cannot keep down any medicines or water. This information is not intended to replace advice given to you by your health care provider. Make sure you discuss any questions you have with your health care provider. Document Released: 08/28/2007 Document Revised: 08/17/2015 Document Reviewed: 01/30/2015 Elsevier Interactive Patient Education  [employer].

## 2017-05-09 LAB — CMP AND LIVER
ALT: 18 IU/L (ref 0–32)
AST: 16 IU/L (ref 0–40)
Albumin: 4.5 g/dL (ref 3.5–5.5)
Alkaline Phosphatase: 94 IU/L (ref 39–117)
BUN: 11 mg/dL (ref 6–24)
Bilirubin Total: 0.2 mg/dL (ref 0.0–1.2)
Bilirubin, Direct: 0.08 mg/dL (ref 0.00–0.40)
CO2: 23 mmol/L (ref 20–29)
CREATININE: 0.86 mg/dL (ref 0.57–1.00)
Calcium: 9.4 mg/dL (ref 8.7–10.2)
Chloride: 106 mmol/L (ref 96–106)
GFR calc Af Amer: 92 mL/min/{1.73_m2} (ref >59)
GFR calc non Af Amer: 80 mL/min/{1.73_m2} (ref >59)
GLUCOSE: 81 mg/dL (ref 65–99)
Potassium: 3.7 mmol/L (ref 3.5–5.2)
SODIUM: 145 mmol/L — AB (ref 134–144)
TOTAL PROTEIN: 7.4 g/dL (ref 6.0–8.5)

## 2017-05-09 LAB — CBC
HEMATOCRIT: 37.9 % (ref 34.0–46.6)
Hemoglobin: 11.8 g/dL (ref 11.1–15.9)
MCH: 24.2 pg — ABNORMAL LOW (ref 26.6–33.0)
MCHC: 31.1 g/dL — AB (ref 31.5–35.7)
MCV: 78 fL — AB (ref 79–97)
Platelets: 325 10*3/uL (ref 150–379)
RBC: 4.87 x10E6/uL (ref 3.77–5.28)
RDW: 17.8 % — AB (ref 12.3–15.4)
WBC: 5.9 10*3/uL (ref 3.4–10.8)

## 2017-05-09 LAB — LIPID PANEL
CHOL/HDL RATIO: 4.8 ratio — AB (ref 0.0–4.4)
Cholesterol, Total: 227 mg/dL — ABNORMAL HIGH (ref 100–199)
HDL: 47 mg/dL (ref >39)
LDL CALC: 155 mg/dL — AB (ref 0–99)
Triglycerides: 126 mg/dL (ref 0–149)
VLDL Cholesterol Cal: 25 mg/dL (ref 5–40)

## 2017-05-09 LAB — HEP, RPR, HIV PANEL
HEP B S AG: NEGATIVE
HIV Screen 4th Generation wRfx: NONREACTIVE
RPR: NONREACTIVE

## 2017-05-09 LAB — HSV(HERPES SIMPLEX VRS) I + II AB-IGG
HSV 1 GLYCOPROTEIN G AB, IGG: 11.5 {index} — AB (ref 0.00–0.90)
HSV 2 IgG, Type Spec: 11.3 index — ABNORMAL HIGH (ref 0.00–0.90)

## 2017-05-12 LAB — URINE CYTOLOGY ANCILLARY ONLY
Chlamydia: NEGATIVE
Neisseria Gonorrhea: NEGATIVE
Trichomonas: POSITIVE — AB

## 2017-05-13 ENCOUNTER — Telehealth: Payer: Self-pay | Admitting: Family Medicine

## 2017-05-13 ENCOUNTER — Other Ambulatory Visit: Payer: Self-pay | Admitting: Family Medicine

## 2017-05-13 DIAGNOSIS — A599 Trichomoniasis, unspecified: Secondary | ICD-10-CM

## 2017-05-13 DIAGNOSIS — E782 Mixed hyperlipidemia: Secondary | ICD-10-CM

## 2017-05-13 MED ORDER — METRONIDAZOLE 500 MG PO TABS: 2000.0000 mg | ORAL_TABLET | Freq: Once | ORAL | 0 refills | Status: AC

## 2017-05-13 MED ORDER — ATORVASTATIN CALCIUM 20 MG PO TABS: 20.0000 mg | ORAL_TABLET | Freq: Every day | ORAL | 2 refills | Status: DC

## 2017-05-13 NOTE — Telephone Encounter (Signed)
Called patient and informed of lab results and positive trichomoniasis. Will send medications to pharmacy. Explained to patient that any partners she has had will also need to be treated. Questions were answered. She communicates understanding.

## 2017-05-14 ENCOUNTER — Other Ambulatory Visit: Payer: Self-pay | Admitting: Family Medicine

## 2017-05-14 DIAGNOSIS — N760 Acute vaginitis: Principal | ICD-10-CM

## 2017-05-14 DIAGNOSIS — B9689 Other specified bacterial agents as the cause of diseases classified elsewhere: Secondary | ICD-10-CM

## 2017-05-14 LAB — URINE CYTOLOGY ANCILLARY ONLY: CANDIDA VAGINITIS: NEGATIVE

## 2017-05-14 MED ORDER — METRONIDAZOLE 500 MG PO TABS
500.0000 mg | ORAL_TABLET | Freq: Two times a day (BID) | ORAL | 0 refills | Status: DC
Start: 2017-05-14 — End: 2017-08-27

## 2017-05-14 MED FILL — ?METRONIDAZOLE 500MG TABS: 500 | 7 days supply | Qty: 14 | Fill #0

## 2017-05-14 MED FILL — ?METRONIDAZOLE 500MG TABS: 500 | 1 days supply | Qty: 4 | Fill #0

## 2017-05-14 MED FILL — ATORVASTATIN 20 MG TABLET: 20 | 30 days supply | Qty: 30 | Fill #0

## 2017-05-15 ENCOUNTER — Telehealth: Payer: Self-pay | Admitting: Internal Medicine

## 2017-05-15 NOTE — Telephone Encounter (Signed)
Patient requested for a call for a further explanation on her most recent labs.

## 2017-05-19 NOTE — Telephone Encounter (Signed)
Please confirm what patient needs. Thanks

## 2017-05-19 NOTE — Telephone Encounter (Signed)
Pt name and DOB verified. Pt has more questions regarding lab test. Education provided on STD and results. Clarified with patient that medication needed to be picked up for BV. Pt verbalized understanding and will come to pharmacy to pick up medication.

## 2017-05-22 ENCOUNTER — Encounter: Payer: Self-pay | Admitting: Pharmacist

## 2017-05-22 ENCOUNTER — Ambulatory Visit: Payer: Self-pay | Attending: Internal Medicine | Admitting: Pharmacist

## 2017-05-22 VITALS — BP 132/84 | HR 105

## 2017-05-22 DIAGNOSIS — Z79899 Other long term (current) drug therapy: Secondary | ICD-10-CM | POA: Insufficient documentation

## 2017-05-22 DIAGNOSIS — I10 Essential (primary) hypertension: Secondary | ICD-10-CM | POA: Insufficient documentation

## 2017-05-22 NOTE — Patient Instructions (Addendum)
Thanks for coming to see [place]!  Follow up with PCP in April   DASH Eating Plan DASH stands for "Dietary Approaches to Stop Hypertension." The DASH eating plan is a healthy eating plan that has been shown to reduce high blood pressure (hypertension). It may also reduce your risk for type 2 diabetes, heart disease, and stroke. The DASH eating plan may also help with weight loss. What are tips for following this plan? General guidelines  Avoid eating more than 2,300 mg (milligrams) of salt (sodium) a day. If you have hypertension, you may need to reduce your sodium intake to 1,500 mg a day.  Limit alcohol intake to no more than 1 drink a day for nonpregnant women and 2 drinks a day for men. One drink equals 12 oz of beer, 5 oz of wine, or 1 oz of hard liquor.  Work with your health care provider to maintain a healthy body weight or to lose weight. Ask what an ideal weight is for you.  Get at least 30 minutes of exercise that causes your heart to beat faster (aerobic exercise) most days of the week. Activities may include walking, swimming, or biking.  Work with your health care provider or diet and nutrition specialist (dietitian) to adjust your eating plan to your individual calorie needs. Reading food labels  Check food labels for the amount of sodium per serving. Choose foods with less than 5 percent of the Daily Value of sodium. Generally, foods with less than 300 mg of sodium per serving fit into this eating plan.  To find whole grains, look for the word "whole" as the first word in the ingredient list. Shopping  Buy products labeled as "low-sodium" or "no salt added."  Buy fresh foods. Avoid canned foods and premade or frozen meals. Cooking  Avoid adding salt when cooking. Use salt-free seasonings or herbs instead of table salt or sea salt. Check with your health care provider or pharmacist before using salt substitutes.  Do not fry foods. Cook foods using healthy methods such as  baking, boiling, grilling, and broiling instead.  Cook with heart-healthy oils, such as olive, canola, soybean, or sunflower oil. Meal planning   Eat a balanced diet that includes: ? 5 or more servings of fruits and vegetables each day. At each meal, try to fill half of your plate with fruits and vegetables. ? Up to 6-8 servings of whole grains each day. ? Less than 6 oz of lean meat, poultry, or fish each day. A 3-oz serving of meat is about the same size as a deck of cards. One egg equals 1 oz. ? 2 servings of low-fat dairy each day. ? A serving of nuts, seeds, or beans 5 times each week. ? Heart-healthy fats. Healthy fats called Omega-3 fatty acids are found in foods such as flaxseeds and coldwater fish, like sardines, salmon, and mackerel.  Limit how much you eat of the following: ? Canned or prepackaged foods. ? Food that is high in trans fat, such as fried foods. ? Food that is high in saturated fat, such as fatty meat. ? Sweets, desserts, sugary drinks, and other foods with added sugar. ? Full-fat dairy products.  Do not salt foods before eating.  Try to eat at least 2 vegetarian meals each week.  Eat more home-cooked food and less restaurant, buffet, and fast food.  When eating at a restaurant, ask that your food be prepared with less salt or no salt, if possible. What foods are recommended?  The items listed may not be a complete list. Talk with your dietitian about what dietary choices are best for you. Grains Whole-grain or whole-wheat bread. Whole-grain or whole-wheat pasta. Brown rice. [place]. Bulgur. Whole-grain and low-sodium cereals. Pita bread. Low-fat, low-sodium crackers. Whole-wheat flour tortillas. Vegetables Fresh or frozen vegetables (raw, steamed, roasted, or grilled). Low-sodium or reduced-sodium tomato and vegetable juice. Low-sodium or reduced-sodium tomato sauce and tomato paste. Low-sodium or reduced-sodium canned vegetables. Fruits All fresh,  dried, or frozen fruit. Canned fruit in natural juice (without added sugar). Meat and other protein foods Skinless chicken or [place]. Ground chicken or [place]. Pork with fat trimmed off. Fish and seafood. Egg whites. Dried beans, peas, or lentils. Unsalted nuts, nut butters, and seeds. Unsalted canned beans. Lean cuts of beef with fat trimmed off. Low-sodium, lean deli meat. Dairy Low-fat (1%) or fat-free (skim) milk. Fat-free, low-fat, or reduced-fat cheeses. Nonfat, low-sodium ricotta or cottage cheese. Low-fat or nonfat yogurt. Low-fat, low-sodium cheese. Fats and oils Soft margarine without trans fats. Vegetable oil. Low-fat, reduced-fat, or light mayonnaise and salad dressings (reduced-sodium). Canola, safflower, olive, soybean, and sunflower oils. Avocado. Seasoning and other foods Herbs. Spices. Seasoning mixes without salt. Unsalted popcorn and pretzels. Fat-free sweets. What foods are not recommended? The items listed may not be a complete list. Talk with your dietitian about what dietary choices are best for you. Grains Baked goods made with fat, such as croissants, muffins, or some breads. Dry pasta or rice meal packs. Vegetables Creamed or fried vegetables. Vegetables in a cheese sauce. Regular canned vegetables (not low-sodium or reduced-sodium). Regular canned tomato sauce and paste (not low-sodium or reduced-sodium). Regular tomato and vegetable juice (not low-sodium or reduced-sodium). Angie Fava. Olives. Fruits Canned fruit in a light or heavy syrup. Fried fruit. Fruit in cream or butter sauce. Meat and other protein foods Fatty cuts of meat. Ribs. Fried meat. Berniece Salines. Sausage. Bologna and other processed lunch meats. Salami. Fatback. Hotdogs. Bratwurst. Salted nuts and seeds. Canned beans with added salt. Canned or smoked fish. Whole eggs or egg yolks. Chicken or [place] with skin. Dairy Whole or 2% milk, cream, and half-and-half. Whole or full-fat cream cheese. Whole-fat or sweetened  yogurt. Full-fat cheese. Nondairy creamers. Whipped toppings. Processed cheese and cheese spreads. Fats and oils Butter. Stick margarine. Lard. Shortening. Ghee. Bacon fat. Tropical oils, such as coconut, palm kernel, or palm oil. Seasoning and other foods Salted popcorn and pretzels. Onion salt, garlic salt, seasoned salt, table salt, and sea salt. Worcestershire sauce. Tartar sauce. Barbecue sauce. Teriyaki sauce. Soy sauce, including reduced-sodium. Steak sauce. Canned and packaged gravies. Fish sauce. Oyster sauce. Cocktail sauce. Horseradish that you find on the shelf. Ketchup. Mustard. Meat flavorings and tenderizers. Bouillon cubes. Hot sauce and Tabasco sauce. Premade or packaged marinades. Premade or packaged taco seasonings. Relishes. Regular salad dressings. Where to find more information:  National Heart, Lung, and [place]: https://wilson-eaton.com/  American Heart Association: www.heart.org Summary  The DASH eating plan is a healthy eating plan that has been shown to reduce high blood pressure (hypertension). It may also reduce your risk for type 2 diabetes, heart disease, and stroke.  With the DASH eating plan, you should limit salt (sodium) intake to 2,300 mg a day. If you have hypertension, you may need to reduce your sodium intake to 1,500 mg a day.  When on the DASH eating plan, aim to eat more fresh fruits and vegetables, whole grains, lean proteins, low-fat dairy, and heart-healthy fats.  Work with your health care  provider or diet and nutrition specialist (dietitian) to adjust your eating plan to your individual calorie needs. This information is not intended to replace advice given to you by your health care provider. Make sure you discuss any questions you have with your health care provider. Document Released: 02/28/2011 Document Revised: 03/04/2016 Document Reviewed: 03/04/2016 Elsevier Interactive Patient Education  2018 [employer] Inc[employer]  

## 2017-05-22 NOTE — Progress Notes (Signed)
   S:    Patient arrives in good spirits.  Presents to the clinic for hypertension evaluation.   Patient reports adherence with medications.  Current BP Medications include:  Amlodipine 2.5 mg daily   Antihypertensives tried in the past include: none  O:   Last 3 Office BP readings: BP Readings from Last 3 Encounters:  05/22/17 132/84  05/08/17 (!) 134/94  02/26/17 (!) 141/95    BMET    Component Value Date/Time   NA 145 (H) 05/08/2017 1656   K 3.7 05/08/2017 1656   CL 106 05/08/2017 1656   CO2 23 05/08/2017 1656   GLUCOSE 81 05/08/2017 1656   GLUCOSE 92 12/21/2014 1026   BUN 11 05/08/2017 1656   CREATININE 0.86 05/08/2017 1656   CREATININE 0.78 12/21/2014 1026   CALCIUM 9.4 05/08/2017 1656   GFRNONAA 80 05/08/2017 1656   GFRNONAA >89 12/21/2014 1026   GFRAA 92 05/08/2017 1656   GFRAA >89 12/21/2014 1026    A/P: Hypertension longstanding currently controlled on current medications.  Continued current medications.   Results reviewed and written information provided.   Total time in face-to-face counseling 5 minutes.   F/U Clinic Visit with PCP as directed.  Patient seen with Reino Bellis, PharmD Candidate

## 2017-06-10 MED FILL — FERROUS SULFATE 325 MG TAB: 325 (65 FE) | 30 days supply | Qty: 60 | Fill #1

## 2017-06-10 MED FILL — ATORVASTATIN 20 MG TABLET: 20 | 30 days supply | Qty: 30 | Fill #1

## 2017-06-10 MED FILL — AMLODIPINE BESYLATE 2.5 MG: 2.5 | 30 days supply | Qty: 30 | Fill #1

## 2017-07-17 MED FILL — ATORVASTATIN 20 MG TABLET: 20 | 30 days supply | Qty: 30 | Fill #2

## 2017-07-17 MED FILL — AMLODIPINE 2.5 MG TABLET: 2.5 | 30 days supply | Qty: 30 | Fill #2

## 2017-07-23 ENCOUNTER — Ambulatory Visit: Payer: Self-pay | Admitting: Nurse Practitioner

## 2017-08-27 ENCOUNTER — Encounter: Payer: Self-pay | Admitting: Nurse Practitioner

## 2017-08-27 ENCOUNTER — Ambulatory Visit: Payer: Self-pay | Attending: Nurse Practitioner | Admitting: Nurse Practitioner

## 2017-08-27 VITALS — BP 127/86 | HR 97 | Temp 98.9°F | Ht 61.0 in | Wt 171.2 lb

## 2017-08-27 DIAGNOSIS — F17200 Nicotine dependence, unspecified, uncomplicated: Secondary | ICD-10-CM

## 2017-08-27 DIAGNOSIS — Z7982 Long term (current) use of aspirin: Secondary | ICD-10-CM | POA: Insufficient documentation

## 2017-08-27 DIAGNOSIS — I10 Essential (primary) hypertension: Secondary | ICD-10-CM | POA: Insufficient documentation

## 2017-08-27 DIAGNOSIS — N390 Urinary tract infection, site not specified: Secondary | ICD-10-CM | POA: Insufficient documentation

## 2017-08-27 DIAGNOSIS — Z7689 Persons encountering health services in other specified circumstances: Secondary | ICD-10-CM | POA: Insufficient documentation

## 2017-08-27 DIAGNOSIS — R399 Unspecified symptoms and signs involving the genitourinary system: Secondary | ICD-10-CM

## 2017-08-27 DIAGNOSIS — Z7251 High risk heterosexual behavior: Secondary | ICD-10-CM | POA: Insufficient documentation

## 2017-08-27 DIAGNOSIS — E782 Mixed hyperlipidemia: Secondary | ICD-10-CM | POA: Insufficient documentation

## 2017-08-27 DIAGNOSIS — Z79899 Other long term (current) drug therapy: Secondary | ICD-10-CM | POA: Insufficient documentation

## 2017-08-27 DIAGNOSIS — Z8249 Family history of ischemic heart disease and other diseases of the circulatory system: Secondary | ICD-10-CM | POA: Insufficient documentation

## 2017-08-27 LAB — POCT URINALYSIS DIPSTICK
Blood, UA: NEGATIVE
Glucose, UA: POSITIVE — AB
KETONES UA: NEGATIVE
Leukocytes, UA: NEGATIVE
Nitrite, UA: NEGATIVE
PROTEIN UA: POSITIVE — AB
Spec Grav, UA: 1.025 (ref 1.010–1.025)
Urobilinogen, UA: 2 E.U./dL — AB
pH, UA: 5.5 (ref 5.0–8.0)

## 2017-08-27 MED ORDER — NICOTINE 21 MG/24HR TD PT24: 21.0000 mg | MEDICATED_PATCH | Freq: Every day | TRANSDERMAL | 0 refills | Status: AC

## 2017-08-27 MED ORDER — AMLODIPINE BESYLATE 2.5 MG PO TABS: 2.5000 mg | ORAL_TABLET | Freq: Every day | ORAL | 2 refills | Status: DC

## 2017-08-27 MED ORDER — ATORVASTATIN CALCIUM 20 MG PO TABS: 20.0000 mg | ORAL_TABLET | Freq: Every day | ORAL | 2 refills | Status: DC

## 2017-08-27 MED ORDER — NICOTINE 14 MG/24HR TD PT24: 14.0000 mg | MEDICATED_PATCH | Freq: Every day | TRANSDERMAL | 0 refills | Status: AC

## 2017-08-27 MED ORDER — NICOTINE 7 MG/24HR TD PT24: 7.0000 mg | MEDICATED_PATCH | Freq: Every day | TRANSDERMAL | 0 refills | Status: AC

## 2017-08-27 MED FILL — AMLODIPINE 2.5 MG TABLET: 2.5 | 30 days supply | Qty: 30 | Fill #0

## 2017-08-27 MED FILL — NICOTINE 21 MG/24HR PATCH: 21 | 42 days supply | Qty: 42 | Fill #0

## 2017-08-27 MED FILL — ATORVASTATIN 20 MG TABLET: 20 | 30 days supply | Qty: 30 | Fill #0

## 2017-08-27 NOTE — Progress Notes (Signed)
Assessment & Plan:  Amanda Tran was seen today for establish care and urinary tract infection.  Diagnoses and all orders for this visit:  Essential hypertension -     amLODipine (NORVASC) 2.5 MG tablet; Take 1 tablet (2.5 mg total) by mouth daily. Continue all antihypertensives as prescribed.  Remember to bring in your blood pressure log with you for your follow up appointment.  DASH/Mediterranean Diets are healthier choices for HTN.   Mixed hyperlipidemia -     atorvastatin (LIPITOR) 20 MG tablet; Take 1 tablet (20 mg total) by mouth daily. INSTRUCTIONS: Work on a low fat, heart healthy diet and participate in regular aerobic exercise program by working out at least 150 minutes per week. No fried foods. No junk foods, sodas, sugary drinks, unhealthy snacking, alcohol or smoking.    UTI symptoms -     Urinalysis Dipstick  High risk heterosexual behavior -     Urine cytology ancillary only    Patient has been counseled on age-appropriate routine health concerns for screening and prevention. These are reviewed and up-to-date. Referrals have been placed accordingly. Immunizations are up-to-date or declined.    Subjective:   Chief Complaint  Patient presents with  . Establish Care    Pt. is here to establish care for hypertension.   . Urinary Tract Infection    Pt. stated she have a little pain when she urinates. Pt. would like STD screening.    HPI Amanda Tran 52 y.o. female presents to office today to establish care. She has a history of HTN and HPL.    Essential Hypertension  Chronic and well controlled today. She endorses medication compliance taking amlodipine 2.5mg  daily. Denies chest pain, shortness of breath, palpitations, lightheadedness, dizziness, headaches or BLE edema.  BP Readings from Last 3 Encounters:  08/27/17 127/86  05/22/17 132/84  05/08/17 (!) 134/94   Hyperlipidemia Patient presents for follow up to hyperlipidemia.  She is medication  compliant taking lipitor 20mg . She is not diet compliant and denies chest pain, exertional chest pressure/discomfort, feeding intolerance, lower extremity edema, poor exercise tolerance and skin xanthelasma or statin intolerance including myalgias.  Lab Results  Component Value Date   CHOL 227 (H) 05/08/2017   Lab Results  Component Value Date   HDL 47 05/08/2017   Lab Results  Component Value Date   LDLCALC 155 (H) 05/08/2017   Lab Results  Component Value Date   TRIG 126 05/08/2017   Lab Results  Component Value Date   CHOLHDL 4.8 (H) 05/08/2017   Sexually Transmitted Disease Check Patient presents for sexually transmitted disease check. Sexual history reviewed with the patient. STD exposure: sexual contact with individual with uncertain background a few days ago.  Previous history of STD:  HSV. Current symptoms include dysuria: mild.  Contraception: condoms and sometimes no condoms.   Review of Systems  Constitutional: Negative for fever, malaise/fatigue and weight loss.  HENT: Negative.  Negative for nosebleeds.   Eyes: Negative.  Negative for blurred vision, double vision and photophobia.  Respiratory: Negative.  Negative for cough and shortness of breath.   Cardiovascular: Negative.  Negative for chest pain, palpitations and leg swelling.  Gastrointestinal: Negative.  Negative for heartburn, nausea and vomiting.  Musculoskeletal: Negative.  Negative for myalgias.  Neurological: Negative.  Negative for dizziness, focal weakness, seizures and headaches.  Psychiatric/Behavioral: Negative.  Negative for suicidal ideas.    Past Medical History:  Diagnosis Date  . DVT (deep venous thrombosis) ([place]) 03/11/2012   Left popliteal   .  Hyperlipidemia   . Hypertension     Past Surgical History:  Procedure Laterality Date  . [place], [id number], 1996  . TUBAL LIGATION      Family History  Problem Relation Age of Onset  . Hypertension Father   . Diabetes  Father     Social History Reviewed with no changes to be made today.   Outpatient Medications Prior to Visit  Medication Sig Dispense Refill  . amLODipine (NORVASC) 2.5 MG tablet Take 1 tablet (2.5 mg total) by mouth daily. 30 tablet 2  . atorvastatin (LIPITOR) 20 MG tablet Take 1 tablet (20 mg total) by mouth daily. 30 tablet 2  . aspirin 81 MG tablet Take 1 tablet (81 mg total) by mouth daily. (Patient not taking: Reported on 03/07/2016) 30 tablet 11  . cetirizine (ZYRTEC) 10 MG tablet Take 1 tablet (10 mg total) by mouth daily. (Patient not taking: Reported on 02/26/2017) 30 tablet 11  . Cholecalciferol (VITAMIN D3) 2000 units TABS Take 2,000 Units by mouth daily. (Patient not taking: Reported on 03/07/2016) 30 tablet 11  . ferrous sulfate 325 (65 FE) MG tablet Take 1 tablet (325 mg total) by mouth 2 (two) times daily with a meal. (Patient not taking: Reported on 11/05/2016) 60 tablet 1  . fluticasone (FLONASE) 50 MCG/ACT nasal spray Place 2 sprays into both nostrils daily. (Patient not taking: Reported on 05/08/2017) 16 g 6  . Vitamin D, Ergocalciferol, (DRISDOL) 50000 units CAPS capsule Take 1 capsule (50,000 Units total) by mouth every 7 (seven) days. For 12 weeks (Patient not taking: Reported on 11/05/2016) 12 capsule 0  . buPROPion (WELLBUTRIN SR) 150 MG 12 hr tablet Take 1 tablet (150 mg total) by mouth 2 (two) times daily. (Patient not taking: Reported on 08/27/2017) 60 tablet 2  . metroNIDAZOLE (FLAGYL) 500 MG tablet Take 1 tablet (500 mg total) by mouth 2 (two) times daily. 14 tablet 0  . nitrofurantoin, macrocrystal-monohydrate, (MACROBID) 100 MG capsule Take 1 capsule (100 mg total) by mouth 2 (two) times daily. 10 capsule 0  . traZODone (DESYREL) 50 MG tablet Take 0.5-1 tablets (25-50 mg total) by mouth at bedtime as needed for sleep. (Patient not taking: Reported on 05/08/2017) 30 tablet 5   No facility-administered medications prior to visit.     No Known Allergies     Objective:      BP 127/86 (BP Location: Left Arm, Patient Position: Sitting, Cuff Size: Normal)   Pulse 97   Temp 98.9 F (37.2 C) (Oral)   Ht 5\' 1"  (1.549 m)   Wt 171 lb 3.2 oz (77.7 kg)   SpO2 98%   BMI 32.35 kg/m  Wt Readings from Last 3 Encounters:  08/27/17 171 lb 3.2 oz (77.7 kg)  05/08/17 165 lb (74.8 kg)  02/26/17 167 lb (75.8 kg)    Physical Exam  Constitutional: She is oriented to person, place, and time. She appears well-developed and well-nourished. She is cooperative.  HENT:  Head: Normocephalic and atraumatic.  Eyes: EOM are normal.  Neck: Normal range of motion.  Cardiovascular: Normal rate, regular rhythm, normal heart sounds and intact distal pulses. Exam reveals no gallop and no friction rub.  No murmur heard. Pulmonary/Chest: Effort normal and breath sounds normal. No tachypnea. No respiratory distress. She has no decreased breath sounds. She has no wheezes. She has no rhonchi. She has no rales. She exhibits no tenderness.  Abdominal: Soft. Bowel sounds are normal.  Musculoskeletal: Normal range of  motion. She exhibits no edema.  Neurological: She is alert and oriented to person, place, and time. Coordination normal.  Skin: Skin is warm and dry.  Psychiatric: She has a normal mood and affect. Her behavior is normal. Judgment and thought content normal.  Nursing note and vitals reviewed.      Patient has been counseled extensively about nutrition and exercise as well as the importance of adherence with medications and regular follow-up. The patient was given clear instructions to go to ER or return to medical center if symptoms don't improve, worsen or new problems develop. The patient verbalized understanding.   Follow-up: Return in about 3 months (around 11/27/2017) for HTN/ HPL.   [name], FNP-BC [facility] [place], [place]   08/27/2017, 3:53 PM

## 2017-08-27 NOTE — Patient Instructions (Signed)
Health Risks of Smoking Smoking cigarettes is very bad for your health. Tobacco smoke has over 200 known poisons in it. It contains the poisonous gases nitrogen oxide and carbon monoxide. There are over 60 chemicals in tobacco smoke that cause cancer. Smoking is difficult to quit because a chemical in tobacco, called nicotine, causes addiction or dependence. When you smoke and inhale, nicotine is absorbed rapidly into the bloodstream through your lungs. Both inhaled and non-inhaled nicotine may be addictive. What are the risks of cigarette smoke? Cigarette smokers have an increased risk of many serious medical problems, including:  Lung cancer.  Lung disease, such as pneumonia, bronchitis, and emphysema.  Chest pain (angina) and heart attack because the heart is not getting enough oxygen.  Heart disease and peripheral blood vessel disease.  High blood pressure (hypertension).  Stroke.  Oral cancer, including cancer of the lip, mouth, or voice box.  Bladder cancer.  Pancreatic cancer.  Cervical cancer.  Pregnancy complications, including premature birth.  Stillbirths and smaller newborn babies, birth defects, and genetic damage to sperm.  Early menopause.  Lower estrogen level for women.  Infertility.  Facial wrinkles.  Blindness.  Increased risk of broken bones (fractures).  Senile dementia.  Stomach ulcers and internal bleeding.  Delayed wound healing and increased risk of complications during surgery.  Even smoking lightly shortens your life expectancy by several years.  Because of secondhand smoke exposure, children of smokers have an increased risk of the following:  Sudden infant death syndrome (SIDS).  Respiratory infections.  Lung cancer.  Heart disease.  Ear infections.  What are the benefits of quitting? There are many health benefits of quitting smoking. Here are some of them:  Within days of quitting smoking, your risk of having a heart  attack decreases, your blood flow improves, and your lung capacity improves. Blood pressure, pulse rate, and breathing patterns start returning to normal soon after quitting.  Within months, your lungs may clear up completely.  Quitting for 10 years reduces your risk of developing lung cancer and heart disease to almost that of a nonsmoker.  People who quit may see an improvement in their overall quality of life.  How do I quit smoking? Smoking is an addiction with both physical and psychological effects, and longtime habits can be hard to change. Your health care provider can recommend:  Programs and community resources, which may include group support, education, or talk therapy.  Prescription medicines to help reduce cravings.  Nicotine replacement products, such as patches, gum, and nasal sprays. Use these products only as directed. Do not replace cigarette smoking with electronic cigarettes, which are commonly called e-cigarettes. The safety of e-cigarettes is not known, and some may contain harmful chemicals.  A combination of two or more of these methods.  Where to find more information:  American Lung Association: www.lung.org  American Cancer Society: www.cancer.org Summary  Smoking cigarettes is very bad for your health. Cigarette smokers have an increased risk of many serious medical problems, including several cancers, heart disease, and stroke.  Smoking is an addiction with both physical and psychological effects, and longtime habits can be hard to change.  By stopping right away, you can greatly reduce the risk of medical problems for you and your family.  To help you quit smoking, your health care provider can recommend programs, community resources, prescription medicines, and nicotine replacement products such as patches, gum, and nasal sprays. This information is not intended to replace advice given to you by your health   care provider. Make sure you discuss any  questions you have with your health care provider. Document Released: 04/18/2004 Document Revised: 03/15/2016 Document Reviewed: 03/15/2016 Elsevier Interactive Patient Education  2017 [place] with Quitting Smoking Quitting smoking is a physical and mental challenge. You will face cravings, withdrawal symptoms, and temptation. Before quitting, work with your health care provider to make a plan that can help you cope. Preparation can help you quit and keep you from giving in. How can I cope with cravings? Cravings usually last for 5-10 minutes. If you get through it, the craving will pass. Consider taking the following actions to help you cope with cravings:  Keep your mouth busy: ? Chew sugar-free gum. ? Suck on hard candies or a straw. ? Brush your teeth.  Keep your hands and body busy: ? Immediately change to a different activity when you feel a craving. ? Squeeze or play with a ball. ? Do an activity or a hobby, like making bead jewelry, practicing needlepoint, or working with wood. ? Mix up your normal routine. ? Take a short exercise break. Go for a quick walk or run up and down stairs. ? Spend time in public places where smoking is not allowed.  Focus on doing something kind or helpful for someone else.  Call a friend or family member to talk during a craving.  Join a support group.  Call a quit line, such as 1-800-QUIT-NOW.  Talk with your health care provider about medicines that might help you cope with cravings and make quitting easier for you.  How can I deal with withdrawal symptoms? Your body may experience negative effects as it tries to get used to not having nicotine in the system. These effects are called withdrawal symptoms. They may include:  Feeling hungrier than normal.  Trouble concentrating.  Irritability.  Trouble sleeping.  Feeling depressed.  Restlessness and agitation.  Craving a cigarette.  To manage withdrawal  symptoms:  Avoid places, people, and activities that trigger your cravings.  Remember why you want to quit.  Get plenty of sleep.  Avoid coffee and other caffeinated drinks. These may worsen some of your symptoms.  How can I handle social situations? Social situations can be difficult when you are quitting smoking, especially in the first few weeks. To manage this, you can:  Avoid parties, bars, and other social situations where people might be smoking.  Avoid alcohol.  Leave right away if you have the urge to smoke.  Explain to your family and friends that you are quitting smoking. Ask for understanding and support.  Plan activities with friends or family where smoking is not an option.  What are some ways I can cope with stress? Wanting to smoke may cause stress, and stress can make you want to smoke. Find ways to manage your stress. Relaxation techniques can help. For example:  Breathe slowly and deeply, in through your nose and out through your mouth.  Listen to soothing, relaxing music.  Talk with a family member or friend about your stress.  Light a candle.  Soak in a bath or take a shower.  Think about a peaceful place.  What are some ways I can prevent weight gain? Be aware that many people gain weight after they quit smoking. However, not everyone does. To keep from gaining weight, have a plan in place before you quit and stick to the plan after you quit. Your plan should include:  Having healthy snacks. When you have  a craving, it may help to: ? Eat plain popcorn, crunchy carrots, celery, or other cut vegetables. ? Chew sugar-free gum.  Changing how you eat: ? Eat small portion sizes at meals. ? Eat 4-6 small meals throughout the day instead of 1-2 large meals a day. ? Be mindful when you eat. Do not watch television or do other things that might distract you as you eat.  Exercising regularly: ? Make time to exercise each day. If you do not have time for a  long workout, do short bouts of exercise for 5-10 minutes several times a day. ? Do some form of strengthening exercise, like weight lifting, and some form of aerobic exercise, like running or swimming.  Drinking plenty of water or other low-calorie or no-calorie drinks. Drink 6-8 glasses of water daily, or as much as instructed by your health care provider.  Summary  Quitting smoking is a physical and mental challenge. You will face cravings, withdrawal symptoms, and temptation to smoke again. Preparation can help you as you go through these challenges.  You can cope with cravings by keeping your mouth busy (such as by chewing gum), keeping your body and hands busy, and making calls to family, friends, or a helpline for people who want to quit smoking.  You can cope with withdrawal symptoms by avoiding places where people smoke, avoiding drinks with caffeine, and getting plenty of rest.  Ask your health care provider about the different ways to prevent weight gain, avoid stress, and handle social situations. This information is not intended to replace advice given to you by your health care provider. Make sure you discuss any questions you have with your health care provider. Document Released: 03/08/2016 Document Revised: 03/08/2016 Document Reviewed: 03/08/2016 Elsevier Interactive Patient Education  [employer].

## 2017-08-28 LAB — URINALYSIS, COMPLETE
BILIRUBIN UA: NEGATIVE
KETONES UA: NEGATIVE
Leukocytes, UA: NEGATIVE
Nitrite, UA: NEGATIVE
Protein, UA: NEGATIVE
RBC UA: NEGATIVE
Specific Gravity, UA: >=1.03 — AB (ref 1.005–1.030)
Urobilinogen, Ur: 1 mg/dL (ref 0.2–1.0)
pH, UA: 5.5 (ref 5.0–7.5)

## 2017-08-28 LAB — URINE CYTOLOGY ANCILLARY ONLY
CHLAMYDIA, DNA PROBE: NEGATIVE
Neisseria Gonorrhea: NEGATIVE
Trichomonas: NEGATIVE

## 2017-08-28 LAB — MICROSCOPIC EXAMINATION
Casts: NONE SEEN /lpf
RBC MICROSCOPIC, UA: NONE SEEN /HPF (ref 0–2)

## 2017-08-31 LAB — URINE CYTOLOGY ANCILLARY ONLY: CANDIDA VAGINITIS: NEGATIVE

## 2017-09-01 ENCOUNTER — Telehealth: Payer: Self-pay | Admitting: Nurse Practitioner

## 2017-09-01 NOTE — Telephone Encounter (Signed)
Will route to PCP 

## 2017-09-01 NOTE — Telephone Encounter (Signed)
Patient called and requested an update on her most recent lab results please fu at your earliest convenience.

## 2017-09-02 ENCOUNTER — Other Ambulatory Visit: Payer: Self-pay | Admitting: Nurse Practitioner

## 2017-09-02 MED ORDER — METRONIDAZOLE 500 MG PO TABS: 500.0000 mg | ORAL_TABLET | Freq: Two times a day (BID) | ORAL | 0 refills | Status: AC

## 2017-09-02 MED FILL — metroNIDAZOLE 500 MG TABS: 500 | 7 days supply | Qty: 14 | Fill #0

## 2017-09-02 NOTE — Telephone Encounter (Signed)
CMA spoke to patient to inform on lab results and Rx.  Patient understood. Patient verified DOB.

## 2017-09-02 NOTE — Telephone Encounter (Signed)
-----   Message from [name], NP sent at 09/02/2017 12:46 PM EDT ----- Labs positive for bacterial vaginosis. Will send in prescription to treat

## 2017-09-24 ENCOUNTER — Emergency Department (HOSPITAL_COMMUNITY)
Admission: EM | Admit: 2017-09-24 | Discharge: 2017-09-24 | Disposition: A | Discharge status: Home / Self Care | Payer: Self-pay | Attending: Emergency Medicine | Admitting: Emergency Medicine

## 2017-09-24 ENCOUNTER — Encounter (HOSPITAL_COMMUNITY): Payer: Self-pay

## 2017-09-24 DIAGNOSIS — Z79899 Other long term (current) drug therapy: Secondary | ICD-10-CM | POA: Insufficient documentation

## 2017-09-24 DIAGNOSIS — I10 Essential (primary) hypertension: Secondary | ICD-10-CM | POA: Insufficient documentation

## 2017-09-24 DIAGNOSIS — K047 Periapical abscess without sinus: Secondary | ICD-10-CM

## 2017-09-24 DIAGNOSIS — F17210 Nicotine dependence, cigarettes, uncomplicated: Secondary | ICD-10-CM | POA: Insufficient documentation

## 2017-09-24 MED ORDER — PENICILLIN V POTASSIUM 250 MG PO TABS: 250.0000 mg | ORAL_TABLET | Freq: Four times a day (QID) | ORAL | 0 refills | Status: AC

## 2017-09-24 MED ORDER — TRAMADOL HCL 50 MG PO TABS: 50.0000 mg | ORAL_TABLET | Freq: Four times a day (QID) | ORAL | 0 refills | Status: DC | PRN

## 2017-09-24 NOTE — ED Triage Notes (Signed)
Patient complains of right lower dental pain. Broken tooth for some time that is now causing pain

## 2017-09-24 NOTE — ED Provider Notes (Signed)
[facility] EMERGENCY DEPARTMENT Provider Note   CSN: [id number] Arrival date & time: 09/24/17  9381     History   Chief Complaint No chief complaint on file.   HPI Amanda Tran is a 52 y.o. female.  Patient is a 52 year old female with a history of hypertension, hyperlipidemia and DVT presenting today with dental pain for the last week.  She states the pain is gradually worsening even though she is taking naproxen.  She has a history of multiple dental caries in the back or the teeth are broken off.  She is having pain in the right lower jaw with some swelling.  She denies any difficulty swallowing or difficulty opening her mouth.  The history is provided by the patient.  Dental Pain   This is a new problem. Episode onset: 7 days ago. The problem occurs constantly. The problem has been gradually worsening. The pain is at a severity of 9/10. The pain is severe. Treatments tried: naproxen. The treatment provided no relief.    Past Medical History:  Diagnosis Date  . DVT (deep venous thrombosis) ([place]) 03/11/2012   Left popliteal   . Hyperlipidemia   . Hypertension     Patient Active Problem List   Diagnosis Date Noted  . Shortness of breath 07/02/2016  . Routine screening for STI (sexually transmitted infection) 07/02/2016  . Vaginal irritation 10/06/2015  . Boil 10/06/2015  . 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 HTN (hypertension) 01/09/2015  . Vitamin D insufficiency 12/21/2014  . Fatigue 12/21/2014  . Allergic rhinitis 12/21/2014  . Family history of diabetes mellitus (DM) 03/23/2014  . Iron deficiency anemia 03/18/2012  . Nicotine dependence 03/18/2012    Past Surgical History:  Procedure Laterality Date  . [place], [id number], 1996  . TUBAL LIGATION       OB History   None      Home Medications    Prior to Admission medications   Medication Sig Start Date End Date Taking? Authorizing  Provider  amLODipine (NORVASC) 2.5 MG tablet Take 1 tablet (2.5 mg total) by mouth daily. 08/27/17   [name], NP  aspirin 81 MG tablet Take 1 tablet (81 mg total) by mouth daily. Patient not taking: Reported on 03/07/2016 07/11/15   Boykin Nearing, MD  atorvastatin (LIPITOR) 20 MG tablet Take 1 tablet (20 mg total) by mouth daily. 08/27/17   [name], NP  cetirizine (ZYRTEC) 10 MG tablet Take 1 tablet (10 mg total) by mouth daily. Patient not taking: Reported on 02/26/2017 02/26/17   Argentina Donovan, PA-C  Cholecalciferol (VITAMIN D3) 2000 units TABS Take 2,000 Units by mouth daily. Patient not taking: Reported on 03/07/2016 07/11/15   Boykin Nearing, MD  ferrous sulfate 325 (65 FE) MG tablet Take 1 tablet (325 mg total) by mouth 2 (two) times daily with a meal. Patient not taking: Reported on 11/05/2016 07/02/16   Boykin Nearing, MD  fluticasone (FLONASE) 50 MCG/ACT nasal spray Place 2 sprays into both nostrils daily. Patient not taking: Reported on 05/08/2017 02/26/17   Argentina Donovan, PA-C  Vitamin D, Ergocalciferol, (DRISDOL) 50000 units CAPS capsule Take 1 capsule (50,000 Units total) by mouth every 7 (seven) days. For 12 weeks Patient not taking: Reported on 11/05/2016 07/04/16   Boykin Nearing, MD    Family History Family History  Problem Relation Age of Onset  . Hypertension Father   . Diabetes Father  Social History Social History   Tobacco Use  . Smoking status: Current Every Day Smoker    Packs/day: 0.50    Years: 25.00    Pack years: 12.50    Types: Cigarettes  . Smokeless tobacco: Never Used  Substance Use Topics  . Alcohol use: Yes    Comment: once a month   . Drug use: No     Allergies   Patient has no known allergies.   Review of Systems Review of Systems  All other systems reviewed and are negative.    Physical Exam Updated Vital Signs BP (!) 156/111   Pulse 91   Temp 99.3 F (37.4 C) (Oral)   Resp 18   SpO2 100%    Physical Exam  Constitutional: She is oriented to person, place, and time. She appears well-developed and well-nourished.  HENT:  Mouth/Throat: Oropharynx is clear and moist. No trismus in the jaw. Dental abscesses and dental caries present. No uvula swelling.    Eyes: Pupils are equal, round, and reactive to light. EOM are normal.  Cardiovascular: Normal rate.  Pulmonary/Chest: Effort normal.  Neurological: She is alert and oriented to person, place, and time.  Skin: Skin is warm and dry. Capillary refill takes less than 2 seconds.  Psychiatric: She has a normal mood and affect. Her behavior is normal.  Nursing note and vitals reviewed.    ED Treatments / Results  Labs (all labs ordered are listed, but only abnormal results are displayed) Labs Reviewed - No data to display  EKG None  Radiology No results found.  Procedures Dental Block Date/Time: 09/24/2017 8:12 AM Performed by: Blanchie Dessert, MD Authorized by: Blanchie Dessert, MD   Consent:    Consent obtained:  Verbal   Consent given by:  Patient   Risks discussed:  Swelling, unsuccessful block and pain   Alternatives discussed:  No treatment Indications:    Indications: dental abscess and dental pain   Location:    Anesthesia block type: periapical block. Procedure details (see MAR for exact dosages):    Syringe type:  Aspirating dental syringe   Needle gauge:  27 G   Anesthetic injected:  Bupivacaine 0.5% WITH epi   Injection procedure:  Introduced needle, incremental injection and negative aspiration for blood Post-procedure details:    Outcome:  Pain relieved   Patient tolerance of procedure:  Tolerated well, no immediate complications   (including critical care time)  Medications Ordered in ED Medications - No data to display   Initial Impression / Assessment and Plan / ED Course  I have reviewed the triage vital signs and the nursing notes.  Pertinent labs & imaging results that were  available during my care of the patient were reviewed by me and considered in my medical decision making (see chart for details).     Pt with dental caries and facial swelling.  No signs of ludwig's angina or difficulty swallowing and no systemic symptoms. Will treat with PCN and have pt f/u with dentist.   Final Clinical Impressions(s) / ED Diagnoses   Final diagnoses:  Dental abscess    ED Discharge Orders        Ordered    penicillin v potassium (VEETID) 250 MG tablet  4 times daily     09/24/17 0814    traMADol (ULTRAM) 50 MG tablet  Every 6 hours PRN     09/24/17 2774       Blanchie Dessert, MD 09/24/17 1287

## 2017-11-09 ENCOUNTER — Emergency Department (HOSPITAL_COMMUNITY): Payer: Self-pay

## 2017-11-09 ENCOUNTER — Emergency Department (HOSPITAL_COMMUNITY)
Admission: EM | Admit: 2017-11-09 | Discharge: 2017-11-09 | Disposition: A | Discharge status: Home / Self Care | Payer: Self-pay | Attending: Emergency Medicine | Admitting: Emergency Medicine

## 2017-11-09 ENCOUNTER — Other Ambulatory Visit: Payer: Self-pay

## 2017-11-09 ENCOUNTER — Encounter (HOSPITAL_COMMUNITY): Payer: Self-pay | Admitting: Emergency Medicine

## 2017-11-09 DIAGNOSIS — R5381 Other malaise: Secondary | ICD-10-CM | POA: Insufficient documentation

## 2017-11-09 DIAGNOSIS — I10 Essential (primary) hypertension: Secondary | ICD-10-CM | POA: Insufficient documentation

## 2017-11-09 DIAGNOSIS — R5383 Other fatigue: Secondary | ICD-10-CM

## 2017-11-09 DIAGNOSIS — F17210 Nicotine dependence, cigarettes, uncomplicated: Secondary | ICD-10-CM | POA: Insufficient documentation

## 2017-11-09 DIAGNOSIS — N83202 Unspecified ovarian cyst, left side: Secondary | ICD-10-CM | POA: Insufficient documentation

## 2017-11-09 DIAGNOSIS — R102 Pelvic and perineal pain: Secondary | ICD-10-CM | POA: Insufficient documentation

## 2017-11-09 DIAGNOSIS — R5382 Chronic fatigue, unspecified: Secondary | ICD-10-CM | POA: Insufficient documentation

## 2017-11-09 LAB — CBC
HEMATOCRIT: 36.9 % (ref 36.0–46.0)
Hemoglobin: 10.9 g/dL — ABNORMAL LOW (ref 12.0–15.0)
MCH: 23.4 pg — AB (ref 26.0–34.0)
MCHC: 29.5 g/dL — AB (ref 30.0–36.0)
MCV: 79.4 fL (ref 78.0–100.0)
Platelets: 322 10*3/uL (ref 150–400)
RBC: 4.65 MIL/uL (ref 3.87–5.11)
RDW: 17.6 % — ABNORMAL HIGH (ref 11.5–15.5)
WBC: 6.6 10*3/uL (ref 4.0–10.5)

## 2017-11-09 LAB — COMPREHENSIVE METABOLIC PANEL
ALBUMIN: 3.5 g/dL (ref 3.5–5.0)
ALT: 16 U/L (ref 0–44)
ANION GAP: 7 (ref 5–15)
AST: 24 U/L (ref 15–41)
Alkaline Phosphatase: 86 U/L (ref 38–126)
BUN: 9 mg/dL (ref 6–20)
CO2: 22 mmol/L (ref 22–32)
Calcium: 8.2 mg/dL — ABNORMAL LOW (ref 8.9–10.3)
Chloride: 111 mmol/L (ref 98–111)
Creatinine, Ser: 0.83 mg/dL (ref 0.44–1.00)
GFR calc Af Amer: >60 mL/min (ref >60)
GFR calc non Af Amer: >60 mL/min (ref >60)
GLUCOSE: 112 mg/dL — AB (ref 70–99)
POTASSIUM: 4.1 mmol/L (ref 3.5–5.1)
SODIUM: 140 mmol/L (ref 135–145)
Total Bilirubin: 0.5 mg/dL (ref 0.3–1.2)
Total Protein: 6.6 g/dL (ref 6.5–8.1)

## 2017-11-09 LAB — URINALYSIS, ROUTINE W REFLEX MICROSCOPIC
BACTERIA UA: NONE SEEN
Bilirubin Urine: NEGATIVE
Glucose, UA: NEGATIVE mg/dL
Ketones, ur: NEGATIVE mg/dL
Leukocytes, UA: NEGATIVE
Nitrite: NEGATIVE
PROTEIN: NEGATIVE mg/dL
Specific Gravity, Urine: 1.017 (ref 1.005–1.030)
pH: 6 (ref 5.0–8.0)

## 2017-11-09 LAB — LIPASE, BLOOD: Lipase: 39 U/L (ref 11–51)

## 2017-11-09 LAB — PREGNANCY, URINE: PREG TEST UR: NEGATIVE

## 2017-11-09 MED ORDER — IOPAMIDOL (ISOVUE-300) INJECTION 61%
INTRAVENOUS | Status: AC
  Filled 2017-11-09: qty 100

## 2017-11-09 MED ORDER — SODIUM CHLORIDE 0.9 % IV BOLUS
1000.0000 mL | Freq: Once | INTRAVENOUS | Status: AC
  Administered 2017-11-09: 1000 mL via INTRAVENOUS

## 2017-11-09 MED ORDER — IOPAMIDOL (ISOVUE-300) INJECTION 61%
100.0000 mL | Freq: Once | INTRAVENOUS | Status: AC | PRN
  Administered 2017-11-09: 100 mL via INTRAVENOUS

## 2017-11-09 NOTE — ED Notes (Signed)
Please note: Pt states that she is currently on her menstrual cycle.

## 2017-11-09 NOTE — ED Triage Notes (Signed)
Pt stated, this morning around 0500 I was hot then cold with a little cramping and some nausea.

## 2017-11-09 NOTE — ED Notes (Signed)
Patient verbalized understanding of discharge instructions and denies any further needs or questions at this time. VS stable. Patient ambulatory with steady gait.  

## 2017-11-09 NOTE — ED Provider Notes (Signed)
[facility] EMERGENCY DEPARTMENT Provider Note   CSN: [id number] Arrival date & time: 11/09/17  0900     History   Chief Complaint Chief Complaint  Patient presents with  . Chills  . Nausea  . Abdominal Cramping    HPI Amanda Tran is a 52 y.o. female.  She presents to the emergency department today with feeling sick since yesterday.  She just feels generally weak.  Around 5 AM she had chills and felt hot.  Was associated with some nausea.  She spit up a little bit but did not vomit.  She is noticing some various abdominal pains but seems primarily in her left lower quadrant.  Is been no fever that she knows of.  She is had no diarrhea or constipation.  She is having some vaginal bleeding which she says is her.  She says it is earlier than usual.  She is still having her cycles and usually happens at the beginning of the month.  No vaginal discharge.  No cough no chest pain.  She states she is under a lot of stress and has been pulling many shifts at work.  She has been taking her blood pressure medicines as regularly as she should.  The history is provided by the patient.  Abdominal Cramping  This is a new problem. The current episode started 6 to 12 hours ago. The problem has not changed since onset.Associated symptoms include abdominal pain. Pertinent negatives include no chest pain, no headaches and no shortness of breath. Nothing aggravates the symptoms. Nothing relieves the symptoms. She has tried nothing for the symptoms. The treatment provided no relief.    Past Medical History:  Diagnosis Date  . DVT (deep venous thrombosis) ([place]) 03/11/2012   Left popliteal   . Hyperlipidemia   . Hypertension     Patient Active Problem List   Diagnosis Date Noted  . Shortness of breath 07/02/2016  . Routine screening for STI (sexually transmitted infection) 07/02/2016  . Vaginal irritation 10/06/2015  . Boil 10/06/2015  . Bilateral claudication of lower  limb ([place]) 01/09/2015  . HTN (hypertension) 01/09/2015  . Vitamin D insufficiency 12/21/2014  . Fatigue 12/21/2014  . Allergic rhinitis 12/21/2014  . Family history of diabetes mellitus (DM) 03/23/2014  . Iron deficiency anemia 03/18/2012  . Nicotine dependence 03/18/2012    Past Surgical History:  Procedure Laterality Date  . [place], [id number], 1996  . TUBAL LIGATION       OB History   None      Home Medications    Prior to Admission medications   Medication Sig Start Date End Date Taking? Authorizing Provider  amLODipine (NORVASC) 2.5 MG tablet Take 1 tablet (2.5 mg total) by mouth daily. 08/27/17   [name], NP  aspirin 81 MG tablet Take 1 tablet (81 mg total) by mouth daily. Patient not taking: Reported on 03/07/2016 07/11/15   Boykin Nearing, MD  atorvastatin (LIPITOR) 20 MG tablet Take 1 tablet (20 mg total) by mouth daily. 08/27/17   [name], NP  cetirizine (ZYRTEC) 10 MG tablet Take 1 tablet (10 mg total) by mouth daily. Patient not taking: Reported on 02/26/2017 02/26/17   Argentina Donovan, PA-C  Cholecalciferol (VITAMIN D3) 2000 units TABS Take 2,000 Units by mouth daily. Patient not taking: Reported on 03/07/2016 07/11/15   Boykin Nearing, MD  ferrous sulfate 325 (65 FE) MG tablet Take 1 tablet (325 mg total) by mouth  2 (two) times daily with a meal. Patient not taking: Reported on 11/05/2016 07/02/16   Boykin Nearing, MD  fluticasone (FLONASE) 50 MCG/ACT nasal spray Place 2 sprays into both nostrils daily. Patient not taking: Reported on 05/08/2017 02/26/17   Argentina Donovan, PA-C  traMADol (ULTRAM) 50 MG tablet Take 1 tablet (50 mg total) by mouth every 6 (six) hours as needed. 09/24/17   Blanchie Dessert, MD  Vitamin D, Ergocalciferol, (DRISDOL) 50000 units CAPS capsule Take 1 capsule (50,000 Units total) by mouth every 7 (seven) days. For 12 weeks Patient not taking: Reported on 11/05/2016 07/04/16   Boykin Nearing, MD     Family History Family History  Problem Relation Age of Onset  . Hypertension Father   . Diabetes Father     Social History Social History   Tobacco Use  . Smoking status: Current Every Day Smoker    Packs/day: 0.50    Years: 25.00    Pack years: 12.50    Types: Cigarettes  . Smokeless tobacco: Never Used  Substance Use Topics  . Alcohol use: Yes    Comment: once a month   . Drug use: No     Allergies   Patient has no known allergies.   Review of Systems Review of Systems  Constitutional: Positive for chills and fatigue. Negative for fever.  HENT: Negative for sore throat.   Eyes: Negative for visual disturbance.  Respiratory: Negative for shortness of breath.   Cardiovascular: Negative for chest pain.  Gastrointestinal: Positive for abdominal pain and nausea. Negative for blood in stool, constipation, diarrhea and vomiting.  Genitourinary: Positive for vaginal bleeding. Negative for dysuria, hematuria and vaginal discharge.  Musculoskeletal: Negative for neck pain.  Skin: Negative for rash.  Neurological: Positive for weakness (generalized). Negative for headaches.     Physical Exam Updated Vital Signs BP (!) 148/91 (BP Location: Right Arm)   Pulse 81   Temp 98.3 F (36.8 C) (Oral)   Resp 12   Ht 5' (1.524 m)   Wt 77.1 kg   LMP 11/05/2017   SpO2 100%   BMI 33.20 kg/m   Physical Exam  Constitutional: She appears well-developed and well-nourished. No distress.  HENT:  Head: Normocephalic and atraumatic.  Eyes: Conjunctivae are normal.  Neck: Neck supple.  Cardiovascular: Normal rate and regular rhythm.  No murmur heard. Pulmonary/Chest: Effort normal and breath sounds normal. No respiratory distress.  Abdominal: Soft. She exhibits no distension and no mass. There is tenderness (sift, mild tenderness llq). There is no rebound and no guarding.  Musculoskeletal: She exhibits no edema, tenderness or deformity.  Neurological: She is alert.  Skin:  Skin is warm and dry. Capillary refill takes less than 2 seconds.  Psychiatric: She has a normal mood and affect.  Nursing note and vitals reviewed.    ED Treatments / Results  Labs (all labs ordered are listed, but only abnormal results are displayed) Labs Reviewed  COMPREHENSIVE METABOLIC PANEL - Abnormal; Notable for the following components:      Result Value   Glucose, Bld 112 (*)    Calcium 8.2 (*)    All other components within normal limits  CBC - Abnormal; Notable for the following components:   Hemoglobin 10.9 (*)    MCH 23.4 (*)    MCHC 29.5 (*)    RDW 17.6 (*)    All other components within normal limits  LIPASE, BLOOD  URINALYSIS, ROUTINE W REFLEX MICROSCOPIC  PREGNANCY, URINE    EKG None  Radiology Ct Abdomen Pelvis W Contrast  Result Date: 11/09/2017 CLINICAL DATA:  52 year old female with acute LEFT abdominal and pelvic pain with nausea for 1 day. EXAM: CT ABDOMEN AND PELVIS WITH CONTRAST TECHNIQUE: Multidetector CT imaging of the abdomen and pelvis was performed using the standard protocol following bolus administration of intravenous contrast. CONTRAST:  125mL ISOVUE-300 IOPAMIDOL (ISOVUE-300) INJECTION 61% COMPARISON:  03/19/2012 pelvic ultrasound FINDINGS: Lower chest: No acute abnormality. Hepatobiliary: The liver is unremarkable. The patient is status post cholecystectomy. No biliary dilatation. Pancreas: Unremarkable Spleen: Unremarkable Adrenals/Urinary Tract: Kidneys, adrenal glands and bladder are unremarkable except for a punctate nonobstructing RIGHT LOWER pole renal calculus. Stomach/Bowel: Stomach is within normal limits. Appendix appears normal. No evidence of bowel wall thickening, distention, or inflammatory changes. Vascular/Lymphatic: No significant vascular findings are present. No enlarged abdominal or pelvic lymph nodes. Reproductive: A 3 x 3 x 5.5 cm LEFT ovarian cyst is noted. The uterus and adnexal regions are otherwise unremarkable. Other: No  ascites, abscess or pneumoperitoneum. Musculoskeletal: No acute or significant osseous findings. IMPRESSION: 1. No acute abnormality. 2. 5.5 cm LEFT ovarian cyst. Pelvic ultrasound recommended for further evaluation. 3. Punctate nonobstructing RIGHT renal calculus. Electronically Signed   By: Margarette Canada M.D.   On: 11/09/2017 13:52    Procedures Procedures (including critical care time)  Medications Ordered in ED Medications  sodium chloride 0.9 % bolus 1,000 mL (has no administration in time range)     Initial Impression / Assessment and Plan / ED Course  I have reviewed the triage vital signs and the nursing notes.  Pertinent labs & imaging results that were available during my care of the patient were reviewed by me and considered in my medical decision making (see chart for details).  Clinical Course as of Nov 09 1724  Sun Nov 09, 2017  1405 Patient's abdominal exam is fairly unremarkable.  Her labs also have not shown anything obvious.  Her CT shows a left ovarian cyst and a nonobstructing stone on the right but no inflammatory changes around either.   [MB]    Clinical Course User Index [MB] Hayden Rasmussen, MD    Final Clinical Impressions(s) / ED Diagnoses   Final diagnoses:  Ovarian cyst, left  Malaise and fatigue    ED Discharge Orders    None       Hayden Rasmussen, MD 11/09/17 1726

## 2017-11-09 NOTE — Discharge Instructions (Signed)
You were evaluated in the emergency department for feeling very fatigued with some crampy abdominal pain.  Your lab work and urinalysis were unremarkable.  You had a CAT scan of your abdomen that showed a left ovarian cyst.  This will need to be followed by her doctor likely with further testing.  You should keep well-hydrated and take Tylenol and ibuprofen as needed for pain.  Please return if any worsening symptoms.

## 2017-11-11 ENCOUNTER — Emergency Department (HOSPITAL_COMMUNITY)
Admission: EM | Admit: 2017-11-11 | Discharge: 2017-11-11 | Disposition: A | Discharge status: Home / Self Care | Payer: Self-pay | Attending: Emergency Medicine | Admitting: Emergency Medicine

## 2017-11-11 ENCOUNTER — Encounter (HOSPITAL_COMMUNITY): Payer: Self-pay | Admitting: Emergency Medicine

## 2017-11-11 ENCOUNTER — Other Ambulatory Visit: Payer: Self-pay

## 2017-11-11 DIAGNOSIS — I10 Essential (primary) hypertension: Secondary | ICD-10-CM | POA: Insufficient documentation

## 2017-11-11 DIAGNOSIS — Y999 Unspecified external cause status: Secondary | ICD-10-CM | POA: Insufficient documentation

## 2017-11-11 DIAGNOSIS — Y939 Activity, unspecified: Secondary | ICD-10-CM | POA: Insufficient documentation

## 2017-11-11 DIAGNOSIS — Y929 Unspecified place or not applicable: Secondary | ICD-10-CM | POA: Insufficient documentation

## 2017-11-11 DIAGNOSIS — S46812A Strain of other muscles, fascia and tendons at shoulder and upper arm level, left arm, initial encounter: Secondary | ICD-10-CM

## 2017-11-11 DIAGNOSIS — Z79899 Other long term (current) drug therapy: Secondary | ICD-10-CM | POA: Insufficient documentation

## 2017-11-11 DIAGNOSIS — S29012A Strain of muscle and tendon of back wall of thorax, initial encounter: Secondary | ICD-10-CM | POA: Insufficient documentation

## 2017-11-11 DIAGNOSIS — F17210 Nicotine dependence, cigarettes, uncomplicated: Secondary | ICD-10-CM | POA: Insufficient documentation

## 2017-11-11 DIAGNOSIS — S46811A Strain of other muscles, fascia and tendons at shoulder and upper arm level, right arm, initial encounter: Secondary | ICD-10-CM

## 2017-11-11 MED ORDER — METOCLOPRAMIDE HCL 10 MG PO TABS
10.0000 mg | ORAL_TABLET | Freq: Once | ORAL | Status: AC
  Administered 2017-11-11: 10 mg via ORAL
  Filled 2017-11-11: qty 1

## 2017-11-11 MED ORDER — IBUPROFEN 200 MG PO TABS
600.0000 mg | ORAL_TABLET | Freq: Once | ORAL | Status: AC
  Administered 2017-11-11: 600 mg via ORAL
  Filled 2017-11-11: qty 1

## 2017-11-11 MED ORDER — METHOCARBAMOL 500 MG PO TABS
500.0000 mg | ORAL_TABLET | Freq: Two times a day (BID) | ORAL | 0 refills | Status: DC
Start: 2017-11-11 — End: 2018-06-22
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
## 2017-12-03 ENCOUNTER — Ambulatory Visit: Payer: Self-pay | Attending: Nurse Practitioner | Admitting: Nurse Practitioner

## 2017-12-03 ENCOUNTER — Encounter: Payer: Self-pay | Admitting: Nurse Practitioner

## 2017-12-03 VITALS — BP 117/80 | HR 87 | Temp 99.1°F | Ht 61.0 in | Wt 168.0 lb

## 2017-12-03 DIAGNOSIS — F17200 Nicotine dependence, unspecified, uncomplicated: Secondary | ICD-10-CM

## 2017-12-03 DIAGNOSIS — Z79899 Other long term (current) drug therapy: Secondary | ICD-10-CM | POA: Insufficient documentation

## 2017-12-03 DIAGNOSIS — E782 Mixed hyperlipidemia: Secondary | ICD-10-CM | POA: Insufficient documentation

## 2017-12-03 DIAGNOSIS — I10 Essential (primary) hypertension: Secondary | ICD-10-CM | POA: Insufficient documentation

## 2017-12-03 DIAGNOSIS — Z1211 Encounter for screening for malignant neoplasm of colon: Secondary | ICD-10-CM | POA: Insufficient documentation

## 2017-12-03 DIAGNOSIS — E785 Hyperlipidemia, unspecified: Secondary | ICD-10-CM | POA: Insufficient documentation

## 2017-12-03 DIAGNOSIS — Z7982 Long term (current) use of aspirin: Secondary | ICD-10-CM | POA: Insufficient documentation

## 2017-12-03 DIAGNOSIS — E559 Vitamin D deficiency, unspecified: Secondary | ICD-10-CM | POA: Insufficient documentation

## 2017-12-03 DIAGNOSIS — Z86718 Personal history of other venous thrombosis and embolism: Secondary | ICD-10-CM | POA: Insufficient documentation

## 2017-12-03 DIAGNOSIS — F17210 Nicotine dependence, cigarettes, uncomplicated: Secondary | ICD-10-CM | POA: Insufficient documentation

## 2017-12-03 MED ORDER — NICOTINE 21 MG/24HR TD PT24: 21.0000 mg | MEDICATED_PATCH | Freq: Every day | TRANSDERMAL | 0 refills | Status: AC

## 2017-12-03 MED ORDER — NICOTINE 14 MG/24HR TD PT24: 14.0000 mg | MEDICATED_PATCH | Freq: Every day | TRANSDERMAL | 0 refills | Status: AC

## 2017-12-03 MED ORDER — NICOTINE 7 MG/24HR TD PT24: 7.0000 mg | MEDICATED_PATCH | Freq: Every day | TRANSDERMAL | 0 refills | Status: AC

## 2017-12-03 MED FILL — NICOTINE 7 MG/24HR PATCH: 7 | 14 days supply | Qty: 14 | Fill #0

## 2017-12-03 MED FILL — NICOTINE 21 MG/24HR PATCH: 21 | 14 days supply | Qty: 14 | Fill #0

## 2017-12-03 MED FILL — NICOTINE 14 MG/24HR PATCH: 14 | 14 days supply | Qty: 14 | Fill #0

## 2017-12-03 NOTE — Progress Notes (Signed)
Assessment & Plan:  Amanda Tran was seen today for follow-up.  Diagnoses and all orders for this visit:  Essential hypertension Continue all antihypertensives as prescribed.  Remember to bring in your blood pressure log with you for your follow up appointment.  DASH/Mediterranean Diets are healthier choices for HTN.    Mixed hyperlipidemia INSTRUCTIONS: Work on a low fat, heart healthy diet and participate in regular aerobic exercise program by working out at least 150 minutes per week; 5 days a week-30 minutes per day. Avoid red meat, fried foods. junk foods, sodas, sugary drinks, unhealthy snacking, alcohol and smoking.  Drink at least 48oz of water per day and monitor your carbohydrate intake daily.    Vitamin D deficiency -     VITAMIN D 25 Hydroxy (Vit-D Deficiency, Fractures)  Tobacco dependence 1. Amanda Tran continues to smoke 5-7 cigarettes per day. Amanda Tran was counseled on the dangers of tobacco use, and was advised to quit. We reviewed specific strategies to maximize success, including removing cigarettes and smoking materials from environment, stress management and support of family/friends as well as pharmacological alternatives. 3. A total of 5 minutes was spent on counseling for smoking cessation and Amanda Tran is ready to quit and has chosen Nicotine patches to start today.  [place] was offered Wellbutrin, Chantix, Nicotine patch, Nicotine gum or lozenges.  Due to out of pocket costs Amanda Tran was also given smoking cessation support and advised to contact: the Smoking Cessation hotline: 1-800-QUIT-NOW.  Amanda Tran was also informed of our Smoking cessation classes which are also available through [facility] by calling [phone] or visit our website at [email or web address]  5. Will follow up at next scheduled office visit.   -     nicotine (NICODERM CQ - DOSED IN MG/24 HOURS) 14 mg/24hr patch; Place 1 patch (14 mg total) onto the skin daily for  14 days. -     nicotine (NICODERM CQ - DOSED IN MG/24 HR) 7 mg/24hr patch; Place 1 patch (7 mg total) onto the skin daily for 14 days. -     nicotine (NICODERM CQ - DOSED IN MG/24 HOURS) 21 mg/24hr patch; Place 1 patch (21 mg total) onto the skin daily for 14 days.  Colon cancer screening -     Fecal occult blood, imunochemical([facility])    Patient has been counseled on age-appropriate routine health concerns for screening and prevention. These are reviewed and up-to-date. Referrals have been placed accordingly. Immunizations are up-to-date or declined.    Subjective:   Chief Complaint  Patient presents with  . Follow-up    Pt. is here to follow-up on hypertension and cholesterol. Pt. stated she is not taking her atorvastatin.    HPI Pulcifer 52 y.o. female presents to office today for follow up HTN.   CHRONIC HYPERTENSION Disease Monitoring  Blood pressure range BP Readings from Last 3 Encounters:  12/03/17 117/80  11/11/17 (!) 157/97  11/09/17 (!) 144/80   Chest pain: no   Dyspnea: no   Claudication: no  Medication compliance: yes; taking amlodipine 2.5 mg daily Medication Side Effects  Lightheadedness: no   Urinary frequency: no   Edema: no   Impotence: no  Preventitive Healthcare:  Exercise: no   Diet Pattern: diet: general  Salt Restriction:  no   Hyperlipidemia Patient presents for follow up to hyperlipidemia.  She is not medication compliant. She is not diet compliant and denies skin xanthelasma or statin intolerance including myalgias.  Lab Results  Component Value  Date   CHOL 227 (H) 05/08/2017   Lab Results  Component Value Date   HDL 47 05/08/2017   Lab Results  Component Value Date   LDLCALC 155 (H) 05/08/2017   Lab Results  Component Value Date   TRIG 126 05/08/2017   Lab Results  Component Value Date   CHOLHDL 4.8 (H) 05/08/2017   Review of Systems  Constitutional: Negative for fever, malaise/fatigue and weight loss.    HENT: Negative.  Negative for nosebleeds.   Eyes: Negative.  Negative for blurred vision, double vision and photophobia.  Respiratory: Negative.  Negative for cough and shortness of breath.   Cardiovascular: Negative.  Negative for chest pain, palpitations and leg swelling.  Gastrointestinal: Negative.  Negative for heartburn, nausea and vomiting.  Musculoskeletal: Negative.  Negative for myalgias.  Neurological: Negative.  Negative for dizziness, focal weakness, seizures and headaches.  Psychiatric/Behavioral: Negative.  Negative for suicidal ideas.    Past Medical History:  Diagnosis Date  . DVT (deep venous thrombosis) ([place]) 03/11/2012   Left popliteal   . Hyperlipidemia   . Hypertension     Past Surgical History:  Procedure Laterality Date  . [place], [id number], 1996  . TUBAL LIGATION      Family History  Problem Relation Age of Onset  . Hypertension Father   . Diabetes Father     Social History Reviewed with no changes to be made today.   Outpatient Medications Prior to Visit  Medication Sig Dispense Refill  . amLODipine (NORVASC) 2.5 MG tablet Take 1 tablet (2.5 mg total) by mouth daily. 30 tablet 2  . cetirizine (ZYRTEC) 10 MG tablet Take 1 tablet (10 mg total) by mouth daily. 30 tablet 11  . aspirin 81 MG tablet Take 1 tablet (81 mg total) by mouth daily. (Patient not taking: Reported on 12/03/2017) 30 tablet 11  . atorvastatin (LIPITOR) 20 MG tablet Take 1 tablet (20 mg total) by mouth daily. (Patient not taking: Reported on 12/03/2017) 30 tablet 2  . Cholecalciferol (VITAMIN D3) 2000 units TABS Take 2,000 Units by mouth daily. (Patient not taking: Reported on 03/07/2016) 30 tablet 11  . ferrous sulfate 325 (65 FE) MG tablet Take 1 tablet (325 mg total) by mouth 2 (two) times daily with a meal. (Patient not taking: Reported on 11/05/2016) 60 tablet 1  . methocarbamol (ROBAXIN) 500 MG tablet Take 1 tablet (500 mg total) by mouth 2 (two) times daily.  (Patient not taking: Reported on 12/03/2017) 20 tablet 0  . Vitamin D, Ergocalciferol, (DRISDOL) 50000 units CAPS capsule Take 1 capsule (50,000 Units total) by mouth every 7 (seven) days. For 12 weeks (Patient not taking: Reported on 11/05/2016) 12 capsule 0   No facility-administered medications prior to visit.     No Known Allergies     Objective:    BP 117/80 (BP Location: Left Arm, Patient Position: Sitting, Cuff Size: Large)   Pulse 87   Temp 99.1 F (37.3 C) (Oral)   Ht 5\' 1"  (1.549 m)   Wt 168 lb (76.2 kg)   LMP 11/05/2017   SpO2 97%   BMI 31.74 kg/m  Wt Readings from Last 3 Encounters:  12/03/17 168 lb (76.2 kg)  11/11/17 170 lb (77.1 kg)  11/09/17 170 lb (77.1 kg)    Physical Exam  Constitutional: She is oriented to person, place, and time. She appears well-developed and well-nourished. She is cooperative.  HENT:  Head: Normocephalic and atraumatic.  Eyes: EOM are  normal.  Neck: Normal range of motion.  Cardiovascular: Normal rate, regular rhythm and normal heart sounds. Exam reveals no gallop and no friction rub.  No murmur heard. Pulmonary/Chest: Effort normal and breath sounds normal. No tachypnea. No respiratory distress. She has no decreased breath sounds. She has no wheezes. She has no rhonchi. She has no rales. She exhibits no tenderness.  Abdominal: Bowel sounds are normal.  Musculoskeletal: Normal range of motion. She exhibits no edema.  Neurological: She is alert and oriented to person, place, and time. Coordination normal.  Skin: Skin is warm and dry.  Psychiatric: She has a normal mood and affect. Her behavior is normal. Judgment and thought content normal.  Nursing note and vitals reviewed.        Patient has been counseled extensively about nutrition and exercise as well as the importance of adherence with medications and regular follow-up. The patient was given clear instructions to go to ER or return to medical center if symptoms don't improve,  worsen or new problems develop. The patient verbalized understanding.   Follow-up: Return for Pap smear and then schedule next appointment in 3 months for BP check .   [name], FNP-BC [facility] and [place] [place], [place]   12/05/2017, 11:58 PM

## 2017-12-04 LAB — VITAMIN D 25 HYDROXY (VIT D DEFICIENCY, FRACTURES): VIT D 25 HYDROXY: 24.8 ng/mL — AB (ref 30.0–100.0)

## 2017-12-05 ENCOUNTER — Encounter: Payer: Self-pay | Admitting: Nurse Practitioner

## 2017-12-06 ENCOUNTER — Encounter: Payer: Self-pay | Admitting: Nurse Practitioner

## 2017-12-08 ENCOUNTER — Other Ambulatory Visit: Payer: Self-pay | Admitting: Nurse Practitioner

## 2017-12-08 ENCOUNTER — Telehealth: Payer: Self-pay

## 2017-12-08 MED ORDER — TRAZODONE HCL 100 MG PO TABS: 100.0000 mg | ORAL_TABLET | Freq: Every day | ORAL | 2 refills | Status: DC

## 2017-12-08 NOTE — Telephone Encounter (Signed)
-----   Message from [name], NP sent at 12/06/2017 12:04 AM EDT ----- Vitamin D is still slightly low. You can take OTC Vitamin D 1000 units daily

## 2017-12-08 NOTE — Telephone Encounter (Signed)
Trazodone has been sent to the pharmacy. 

## 2017-12-08 NOTE — Telephone Encounter (Signed)
CMA spoke to patient to inform on results.  Patient verified DOB. Patient understood.  Patient wanted to know if PCP can give her something for help her sleep.

## 2017-12-09 MED FILL — traZODone HCL 100 MG TABS: 100 | 30 days supply | Qty: 30 | Fill #0

## 2017-12-09 NOTE — Telephone Encounter (Signed)
CMA spoke to patient to inform her Rx has been sent. Patient understood.

## 2017-12-17 ENCOUNTER — Telehealth (HOSPITAL_COMMUNITY): Payer: Self-pay | Admitting: *Deleted

## 2017-12-17 NOTE — Telephone Encounter (Signed)
Telephoned patient at home number and left message to return call to [facility] 

## 2017-12-22 ENCOUNTER — Other Ambulatory Visit: Payer: Self-pay | Admitting: Obstetrics and Gynecology

## 2017-12-22 DIAGNOSIS — Z1231 Encounter for screening mammogram for malignant neoplasm of breast: Secondary | ICD-10-CM

## 2017-12-28 LAB — FECAL OCCULT BLOOD, IMMUNOCHEMICAL: Fecal Occult Bld: NEGATIVE

## 2017-12-29 ENCOUNTER — Telehealth: Payer: Self-pay

## 2017-12-29 NOTE — Telephone Encounter (Signed)
CMA attempt to reach patient to inform on results.  No answer and left a VM for patient to call back.  If patient call back, please inform:  Your stool test for colon cancer screening was negative. We will repeat another test next year

## 2017-12-29 NOTE — Telephone Encounter (Signed)
-----   Message from [name], NP sent at 12/29/2017 12:07 AM EDT ----- Your stool test for colon cancer screening was negative. We will repeat another test next year

## 2018-01-02 ENCOUNTER — Other Ambulatory Visit: Payer: Self-pay | Admitting: Nurse Practitioner

## 2018-01-23 ENCOUNTER — Encounter: Payer: Self-pay | Admitting: Nurse Practitioner

## 2018-01-23 ENCOUNTER — Ambulatory Visit: Payer: Self-pay | Attending: Nurse Practitioner | Admitting: Nurse Practitioner

## 2018-01-23 DIAGNOSIS — Z01419 Encounter for gynecological examination (general) (routine) without abnormal findings: Secondary | ICD-10-CM | POA: Insufficient documentation

## 2018-01-23 DIAGNOSIS — E785 Hyperlipidemia, unspecified: Secondary | ICD-10-CM | POA: Insufficient documentation

## 2018-01-23 DIAGNOSIS — Z79899 Other long term (current) drug therapy: Secondary | ICD-10-CM | POA: Insufficient documentation

## 2018-01-23 DIAGNOSIS — Z7982 Long term (current) use of aspirin: Secondary | ICD-10-CM | POA: Insufficient documentation

## 2018-01-23 DIAGNOSIS — Z8249 Family history of ischemic heart disease and other diseases of the circulatory system: Secondary | ICD-10-CM | POA: Insufficient documentation

## 2018-01-23 DIAGNOSIS — I10 Essential (primary) hypertension: Secondary | ICD-10-CM | POA: Insufficient documentation

## 2018-01-23 DIAGNOSIS — Z86718 Personal history of other venous thrombosis and embolism: Secondary | ICD-10-CM | POA: Insufficient documentation

## 2018-01-25 NOTE — Progress Notes (Signed)
   Assessment & Plan:  There are no diagnoses linked to this encounter.  Patient has been counseled on age-appropriate routine health concerns for screening and prevention. These are reviewed and up-to-date. Referrals have been placed accordingly. Immunizations are up-to-date or declined.    Subjective:   Chief Complaint  Patient presents with  . Gynecologic Exam    Pt. is here for a pap smear.    HPI Amanda Tran 52 y.o. female presents to office today   ROS  Past Medical History:  Diagnosis Date  . DVT (deep venous thrombosis) ([place]) 03/11/2012   Left popliteal   . Hyperlipidemia   . Hypertension     Past Surgical History:  Procedure Laterality Date  . [place], [id number], 1996  . TUBAL LIGATION      Family History  Problem Relation Age of Onset  . Hypertension Father   . Diabetes Father     Social History Reviewed with no changes to be made today.   Outpatient Medications Prior to Visit  Medication Sig Dispense Refill  . amLODipine (NORVASC) 2.5 MG tablet Take 1 tablet (2.5 mg total) by mouth daily. 30 tablet 2  . aspirin 81 MG tablet Take 1 tablet (81 mg total) by mouth daily. (Patient not taking: Reported on 12/03/2017) 30 tablet 11  . atorvastatin (LIPITOR) 20 MG tablet Take 1 tablet (20 mg total) by mouth daily. (Patient not taking: Reported on 12/03/2017) 30 tablet 2  . cetirizine (ZYRTEC) 10 MG tablet Take 1 tablet (10 mg total) by mouth daily. (Patient not taking: Reported on 01/23/2018) 30 tablet 11  . Cholecalciferol (VITAMIN D3) 2000 units TABS Take 2,000 Units by mouth daily. (Patient not taking: Reported on 03/07/2016) 30 tablet 11  . ferrous sulfate 325 (65 FE) MG tablet Take 1 tablet (325 mg total) by mouth 2 (two) times daily with a meal. (Patient not taking: Reported on 11/05/2016) 60 tablet 1  . methocarbamol (ROBAXIN) 500 MG tablet Take 1 tablet (500 mg total) by mouth 2 (two) times daily. (Patient not taking: Reported on  12/03/2017) 20 tablet 0  . traZODone (DESYREL) 100 MG tablet Take 1 tablet (100 mg total) by mouth at bedtime. (Patient not taking: Reported on 01/23/2018) 90 tablet 2  . Vitamin D, Ergocalciferol, (DRISDOL) 50000 units CAPS capsule Take 1 capsule (50,000 Units total) by mouth every 7 (seven) days. For 12 weeks (Patient not taking: Reported on 11/05/2016) 12 capsule 0   No facility-administered medications prior to visit.     No Known Allergies     Objective:    Ht 5\' 1"  (1.549 m)   Wt 169 lb 9.6 oz (76.9 kg)   BMI 32.05 kg/m  Wt Readings from Last 3 Encounters:  01/23/18 169 lb 9.6 oz (76.9 kg)  12/03/17 168 lb (76.2 kg)  11/11/17 170 lb (77.1 kg)    Physical Exam       Patient has been counseled extensively about nutrition and exercise as well as the importance of adherence with medications and regular follow-up. The patient was given clear instructions to go to ER or return to medical center if symptoms don't improve, worsen or new problems develop. The patient verbalized understanding.   Follow-up: No follow-ups on file.   Amanda Pounds, FNP-BC [facility] and [place], [place]   01/25/2018, 1:05 PM This encounter was created in error - please disregard.

## 2018-02-13 ENCOUNTER — Other Ambulatory Visit: Payer: Self-pay | Admitting: Nurse Practitioner

## 2018-02-26 ENCOUNTER — Encounter (HOSPITAL_COMMUNITY): Payer: Self-pay

## 2018-02-26 ENCOUNTER — Ambulatory Visit
Admission: RE | Admit: 2018-02-26 | Discharge: 2018-02-26 | Disposition: A | Discharge status: Home / Self Care | Payer: Self-pay | Source: Ambulatory Visit | Attending: Obstetrics and Gynecology | Admitting: Obstetrics and Gynecology

## 2018-02-26 ENCOUNTER — Encounter (HOSPITAL_COMMUNITY): Payer: Self-pay | Admitting: *Deleted

## 2018-02-26 ENCOUNTER — Ambulatory Visit (HOSPITAL_COMMUNITY)
Admission: RE | Admit: 2018-02-26 | Discharge: 2018-02-26 | Disposition: A | Discharge status: Home / Self Care | Payer: No Typology Code available for payment source | Source: Ambulatory Visit | Attending: Obstetrics and Gynecology | Admitting: Obstetrics and Gynecology

## 2018-02-26 VITALS — BP 130/68 | Ht 61.0 in | Wt 171.0 lb

## 2018-02-26 DIAGNOSIS — Z1231 Encounter for screening mammogram for malignant neoplasm of breast: Secondary | ICD-10-CM

## 2018-02-26 DIAGNOSIS — Z1239 Encounter for other screening for malignant neoplasm of breast: Secondary | ICD-10-CM

## 2018-02-26 NOTE — Patient Instructions (Addendum)
Explained breast self awareness with Amanda Tran. Patient did not need a Pap smear today due to last Pap smear and HPV typing is 12/21/2014. Let her know [facility] will cover Pap smears and HPV typing every 5 years unless has a history of abnormal Pap smears. Referred patient to the [facility] for a screening mammogram. Appointment scheduled for Thursday, February 26, 2018 at 1600. Patient aware of appointment and will be there. Let patient know the [facility] will follow up with her within the next couple weeks with results of mammogram by letter or phone. [place] verbalized understanding.  [name], [place], RN 2:41 PM

## 2018-02-26 NOTE — Addendum Note (Signed)
Encounter addended by: Loletta Parish, RN on: 02/26/2018 3:12 PM  Actions taken: Sign clinical note

## 2018-02-26 NOTE — Progress Notes (Addendum)
No complaints today.   Pap Smear: Pap smear not completed today. Last Pap smear was 12/21/2014 at [facility] and normal with negative HPV. Per patient has a history of an abnormal Pap smear in 1990 that she thinks a colposcopy was completed for follow-up. Per patient all Pap smears have been normal since her follow-up. Last Pap smear result is in Epic.  Physical exam: Breasts Breasts symmetrical. No skin abnormalities bilateral breasts. No nipple retraction bilateral breasts. No nipple discharge bilateral breasts. No lymphadenopathy. No lumps palpated bilateral breasts. No complaints of pain or tenderness on exam. Referred patient to the [facility] for a screening mammogram. Appointment scheduled for Thursday, February 26, 2018 at 1600.        Pelvic/Bimanual No Pap smear completed today since last Pap smear and HPV typing was 12/21/2014. Pap smear not indicated per [facility] guidelines.   Smoking History: Patient is a former smoker that quit 3-4 months ago.  Patient Navigation: Patient education provided. Access to services provided for patient through [place] program.   Colorectal Cancer Screening: Per patient has never had a colonoscopy completed. Patient completed a FIT Test 12/14/2017 that was negative. No complaints today.   Breast and Cervical Cancer Risk Assessment: Patient has no family history of breast cancer, known genetic mutations, or radiation treatment to the chest before age 6. Per patient has a history of cervical dysplasia. Patient has no history of being immunocompromised or DES exposure in-utero.  Risk Assessment    Risk Scores      02/26/2018   Last edited by: Loletta Parish, RN   5-year risk: 0.9 %   Lifetime risk: 6.7 %

## 2018-03-02 ENCOUNTER — Other Ambulatory Visit: Payer: Self-pay | Admitting: Nurse Practitioner

## 2018-03-06 ENCOUNTER — Encounter: Payer: Self-pay | Admitting: Pharmacist

## 2018-03-31 ENCOUNTER — Ambulatory Visit: Payer: Self-pay | Attending: Nurse Practitioner | Admitting: Nurse Practitioner

## 2018-03-31 ENCOUNTER — Encounter: Payer: Self-pay | Admitting: Nurse Practitioner

## 2018-03-31 VITALS — BP 121/85 | HR 82 | Temp 98.0°F | Ht 61.0 in | Wt 174.8 lb

## 2018-03-31 DIAGNOSIS — Z01419 Encounter for gynecological examination (general) (routine) without abnormal findings: Secondary | ICD-10-CM | POA: Insufficient documentation

## 2018-03-31 DIAGNOSIS — Z833 Family history of diabetes mellitus: Secondary | ICD-10-CM | POA: Insufficient documentation

## 2018-03-31 DIAGNOSIS — I10 Essential (primary) hypertension: Secondary | ICD-10-CM | POA: Insufficient documentation

## 2018-03-31 DIAGNOSIS — Z86718 Personal history of other venous thrombosis and embolism: Secondary | ICD-10-CM | POA: Insufficient documentation

## 2018-03-31 DIAGNOSIS — Z79899 Other long term (current) drug therapy: Secondary | ICD-10-CM | POA: Insufficient documentation

## 2018-03-31 DIAGNOSIS — Z8249 Family history of ischemic heart disease and other diseases of the circulatory system: Secondary | ICD-10-CM | POA: Insufficient documentation

## 2018-03-31 DIAGNOSIS — Z124 Encounter for screening for malignant neoplasm of cervix: Secondary | ICD-10-CM

## 2018-03-31 NOTE — Patient Instructions (Signed)

## 2018-03-31 NOTE — Progress Notes (Signed)
Assessment & Plan:  Amanda Tran was seen today for gynecologic exam.  Diagnoses and all orders for this visit:  Encounter for Papanicolaou smear for cervical cancer screening -     Cytology - PAP    Patient has been counseled on age-appropriate routine health concerns for screening and prevention. These are reviewed and up-to-date. Referrals have been placed accordingly. Immunizations are up-to-date or declined.    Subjective:   Chief Complaint  Patient presents with  . Gynecologic Exam   HPI Amanda Tran 53 y.o. female presents to office today for PAP. She had a few qustions regarding her recent normal mammogram. All her questions were answered to the best of my ability.   Review of Systems  Constitutional: Negative.  Negative for chills, fever, malaise/fatigue and weight loss.  Respiratory: Negative.  Negative for cough, shortness of breath and wheezing.   Cardiovascular: Negative.  Negative for chest pain, orthopnea and leg swelling.  Gastrointestinal: Negative for abdominal pain.  Genitourinary: Negative.  Negative for flank pain.       Unpleasant vaginal odor  Skin: Negative.  Negative for rash.  Psychiatric/Behavioral: Negative for suicidal ideas.    Past Medical History:  Diagnosis Date  . DVT (deep venous thrombosis) ([place]) 03/11/2012   Left popliteal   . Hyperlipidemia   . Hypertension     Past Surgical History:  Procedure Laterality Date  . [place], [id number], 1996  . TUBAL LIGATION      Family History  Problem Relation Age of Onset  . Hypertension Father   . Diabetes Father     Social History Reviewed with no changes to be made today.   Outpatient Medications Prior to Visit  Medication Sig Dispense Refill  . amLODipine (NORVASC) 2.5 MG tablet Take 1 tablet (2.5 mg total) by mouth daily. 30 tablet 2  . ferrous sulfate 325 (65 FE) MG tablet Take 1 tablet (325 mg total) by mouth 2 (two) times daily with a meal. 60 tablet 1    . aspirin 81 MG tablet Take 1 tablet (81 mg total) by mouth daily. (Patient not taking: Reported on 12/03/2017) 30 tablet 11  . atorvastatin (LIPITOR) 20 MG tablet Take 1 tablet (20 mg total) by mouth daily. (Patient not taking: Reported on 12/03/2017) 30 tablet 2  . cetirizine (ZYRTEC) 10 MG tablet Take 1 tablet (10 mg total) by mouth daily. (Patient not taking: Reported on 01/23/2018) 30 tablet 11  . Cholecalciferol (VITAMIN D3) 2000 units TABS Take 2,000 Units by mouth daily. (Patient not taking: Reported on 03/07/2016) 30 tablet 11  . methocarbamol (ROBAXIN) 500 MG tablet Take 1 tablet (500 mg total) by mouth 2 (two) times daily. (Patient not taking: Reported on 12/03/2017) 20 tablet 0  . traZODone (DESYREL) 100 MG tablet Take 1 tablet (100 mg total) by mouth at bedtime. (Patient not taking: Reported on 01/23/2018) 90 tablet 2  . Vitamin D, Ergocalciferol, (DRISDOL) 50000 units CAPS capsule Take 1 capsule (50,000 Units total) by mouth every 7 (seven) days. For 12 weeks (Patient not taking: Reported on 11/05/2016) 12 capsule 0   No facility-administered medications prior to visit.     No Known Allergies     Objective:    BP 121/85   Pulse 82   Temp 98 F (36.7 C) (Oral)   Ht 5\' 1"  (1.549 m)   Wt 174 lb 12.8 oz (79.3 kg)   SpO2 93%   BMI 33.03 kg/m  Wt Readings from  Last 3 Encounters:  03/31/18 174 lb 12.8 oz (79.3 kg)  02/26/18 171 lb (77.6 kg)  01/23/18 169 lb 9.6 oz (76.9 kg)    Physical Exam Constitutional:      Appearance: She is well-developed.  HENT:     Head: Normocephalic.  Cardiovascular:     Rate and Rhythm: Normal rate and regular rhythm.     Heart sounds: Normal heart sounds.  Pulmonary:     Effort: Pulmonary effort is normal.     Breath sounds: Normal breath sounds.  Abdominal:     General: Bowel sounds are normal.     Palpations: Abdomen is soft.     Hernia: There is no hernia in the right inguinal area or left inguinal area.  Genitourinary:    Labia:         Right: No rash, tenderness, lesion or injury.        Left: No rash, tenderness, lesion or injury.      Vagina: Normal. No signs of injury and foreign body. No vaginal discharge, erythema, tenderness or bleeding.     Cervix: No cervical motion tenderness or friability.     Uterus: Not deviated and not enlarged.      Adnexa:        Right: No mass, tenderness or fullness.         Left: No mass, tenderness or fullness.       Rectum: Normal. No external hemorrhoid.  Lymphadenopathy:     Lower Body: No right inguinal adenopathy. No left inguinal adenopathy.  Skin:    General: Skin is warm and dry.  Neurological:     Mental Status: She is alert and oriented to person, place, and time.  Psychiatric:        Behavior: Behavior normal.        Thought Content: Thought content normal.        Judgment: Judgment normal.          Patient has been counseled extensively about nutrition and exercise as well as the importance of adherence with medications and regular follow-up. The patient was given clear instructions to go to ER or return to medical center if symptoms don't improve, worsen or new problems develop. The patient verbalized understanding.   Follow-up: No follow-ups on file.   [name], FNP-BC [facility] and [place], [place]   03/31/2018, 2:51 PM

## 2018-04-03 LAB — CYTOLOGY - PAP
BACTERIAL VAGINITIS: POSITIVE — AB
Candida vaginitis: NEGATIVE
Chlamydia: NEGATIVE
DIAGNOSIS: NEGATIVE
HPV: NOT DETECTED
NEISSERIA GONORRHEA: NEGATIVE
Trichomonas: NEGATIVE

## 2018-04-05 ENCOUNTER — Other Ambulatory Visit: Payer: Self-pay | Admitting: Nurse Practitioner

## 2018-04-05 MED ORDER — METRONIDAZOLE 500 MG PO TABS: 500.0000 mg | ORAL_TABLET | Freq: Two times a day (BID) | ORAL | 0 refills | Status: AC

## 2018-04-06 MED FILL — metroNIDAZOLE 500 MG TABS: 500 | 7 days supply | Qty: 14 | Fill #0

## 2018-04-07 ENCOUNTER — Telehealth: Payer: Self-pay | Admitting: Nurse Practitioner

## 2018-04-07 NOTE — Telephone Encounter (Signed)
Patient called back to get her pap smear results. Please follow up with patient.

## 2018-04-08 ENCOUNTER — Telehealth: Payer: Self-pay | Admitting: *Deleted

## 2018-04-08 NOTE — Telephone Encounter (Signed)
Spoke to patient via phone: C/o really bad pain below left knee.  Progressing about 1-2 weeks.  Denies swelling but warm to touch and burning pain. At times leg is cold.   Had noticed a knot on the bottom of her feet Maybe causing pain this pain.   Has not traveled or long rides, no long periods of sitting, denies swelling. Pt has been treated in for DVT in past. Advised to seek further treatment sooner rather than later at an UC or ED.   Advised to call office for f/u appointment after sought urgent medical treatment today. Pt verbalized understanding.

## 2018-04-09 NOTE — Telephone Encounter (Signed)
Attempt to call patient for results. Unable to reach.

## 2018-04-10 NOTE — Telephone Encounter (Signed)
Pt name and DOB verified. Patient aware of results and result note per Zelda Fleming,FNP. Pt verbalized understanding.

## 2018-06-02 ENCOUNTER — Encounter (HOSPITAL_COMMUNITY): Payer: Self-pay | Admitting: *Deleted

## 2018-06-02 ENCOUNTER — Other Ambulatory Visit: Payer: Self-pay

## 2018-06-02 ENCOUNTER — Emergency Department (HOSPITAL_COMMUNITY)
Admission: EM | Admit: 2018-06-02 | Discharge: 2018-06-02 | Disposition: A | Discharge status: Home / Self Care | Payer: PRIVATE HEALTH INSURANCE | Attending: Emergency Medicine | Admitting: Emergency Medicine

## 2018-06-02 DIAGNOSIS — Z87891 Personal history of nicotine dependence: Secondary | ICD-10-CM | POA: Insufficient documentation

## 2018-06-02 DIAGNOSIS — I10 Essential (primary) hypertension: Secondary | ICD-10-CM | POA: Insufficient documentation

## 2018-06-02 DIAGNOSIS — Z79899 Other long term (current) drug therapy: Secondary | ICD-10-CM | POA: Insufficient documentation

## 2018-06-02 DIAGNOSIS — N939 Abnormal uterine and vaginal bleeding, unspecified: Secondary | ICD-10-CM | POA: Insufficient documentation

## 2018-06-02 DIAGNOSIS — Z7982 Long term (current) use of aspirin: Secondary | ICD-10-CM | POA: Insufficient documentation

## 2018-06-02 LAB — CBC WITH DIFFERENTIAL/PLATELET
ABS IMMATURE GRANULOCYTES: 0.01 10*3/uL (ref 0.00–0.07)
Basophils Absolute: 0 10*3/uL (ref 0.0–0.1)
Basophils Relative: 1 %
EOS ABS: 0.1 10*3/uL (ref 0.0–0.5)
EOS PCT: 3 %
HCT: 37.3 % (ref 36.0–46.0)
Hemoglobin: 10.6 g/dL — ABNORMAL LOW (ref 12.0–15.0)
Immature Granulocytes: 0 %
LYMPHS ABS: 1.7 10*3/uL (ref 0.7–4.0)
LYMPHS PCT: 36 %
MCH: 20.9 pg — AB (ref 26.0–34.0)
MCHC: 28.4 g/dL — AB (ref 30.0–36.0)
MCV: 73.7 fL — ABNORMAL LOW (ref 80.0–100.0)
MONOS PCT: 8 %
Monocytes Absolute: 0.4 10*3/uL (ref 0.1–1.0)
NRBC: 0 % (ref 0.0–0.2)
Neutro Abs: 2.6 10*3/uL (ref 1.7–7.7)
Neutrophils Relative %: 52 %
PLATELETS: 355 10*3/uL (ref 150–400)
RBC: 5.06 MIL/uL (ref 3.87–5.11)
RDW: 22.4 % — AB (ref 11.5–15.5)
WBC: 4.8 10*3/uL (ref 4.0–10.5)

## 2018-06-02 LAB — WET PREP, GENITAL
CLUE CELLS WET PREP: NONE SEEN
Sperm: NONE SEEN
Trich, Wet Prep: NONE SEEN
Yeast Wet Prep HPF POC: NONE SEEN

## 2018-06-02 LAB — I-STAT BETA HCG BLOOD, ED (MC, WL, AP ONLY)

## 2018-06-02 LAB — COMPREHENSIVE METABOLIC PANEL
ALK PHOS: 80 U/L (ref 38–126)
ALT: 18 U/L (ref 0–44)
ANION GAP: 5 (ref 5–15)
AST: 29 U/L (ref 15–41)
Albumin: 3.9 g/dL (ref 3.5–5.0)
BUN: 9 mg/dL (ref 6–20)
CALCIUM: 8.6 mg/dL — AB (ref 8.9–10.3)
CHLORIDE: 107 mmol/L (ref 98–111)
CO2: 28 mmol/L (ref 22–32)
Creatinine, Ser: 0.71 mg/dL (ref 0.44–1.00)
GFR calc non Af Amer: >60 mL/min (ref >60)
Glucose, Bld: 103 mg/dL — ABNORMAL HIGH (ref 70–99)
POTASSIUM: 3.4 mmol/L — AB (ref 3.5–5.1)
SODIUM: 140 mmol/L (ref 135–145)
Total Bilirubin: 0.5 mg/dL (ref 0.3–1.2)
Total Protein: 7.4 g/dL (ref 6.5–8.1)

## 2018-06-02 LAB — TYPE AND SCREEN
ABO/RH(D): O POS
ANTIBODY SCREEN: NEGATIVE

## 2018-06-02 LAB — ABO/RH: ABO/RH(D): O POS

## 2018-06-02 MED ORDER — MEGESTROL ACETATE 40 MG PO TABS: 40.0000 mg | ORAL_TABLET | Freq: Two times a day (BID) | ORAL | 0 refills | Status: DC

## 2018-06-02 NOTE — ED Provider Notes (Signed)
[facility] DEPT Provider Note   CSN: [id number] Arrival date & time: 06/02/18  1230    History   Chief Complaint Chief Complaint  Patient presents with  . Vaginal Bleeding    HPI Amanda Tran is a 53 y.o. female presents emergency department chief complaint of vaginal bleeding.  Has a history of heavy menstrual bleeding.  She is not perimenopausal and has regular menstrual period.  She is currently menstruation.  Patient states that her bleeding is excessively heavy today.  She states that every time she stands up blood "pours out of my vagina."  She says that she has been soaking about 2 pads an hour.  She denies feeling lightheaded or presyncopal.  She denies abdominal pain or cramping.  She denies urinary symptoms.  She does not take any blood thinning medications.     HPI  Past Medical History:  Diagnosis Date  . DVT (deep venous thrombosis) ([place]) 03/11/2012   Left popliteal   . Hyperlipidemia   . Hypertension     Patient Active Problem List   Diagnosis Date Noted  . Shortness of breath 07/02/2016  . Routine screening for STI (sexually transmitted infection) 07/02/2016  . Vaginal irritation 10/06/2015  . Boil 10/06/2015  . HTN (hypertension) 01/09/2015  . Vitamin D insufficiency 12/21/2014  . Fatigue 12/21/2014  . Allergic rhinitis 12/21/2014  . Family history of diabetes mellitus (DM) 03/23/2014  . Iron deficiency anemia 03/18/2012  . Nicotine dependence 03/18/2012    Past Surgical History:  Procedure Laterality Date  . [place], [id number], 1996  . TUBAL LIGATION       OB History    Gravida  5   Para      Term      Preterm      AB      Living  5     SAB      TAB      Ectopic      Multiple      Live Births  5            Home Medications    Prior to Admission medications   Medication Sig Start Date End Date Taking? Authorizing Provider  amLODipine (NORVASC) 2.5 MG tablet  Take 1 tablet (2.5 mg total) by mouth daily. 08/27/17  Yes [name], NP  atorvastatin (LIPITOR) 20 MG tablet Take 1 tablet (20 mg total) by mouth daily. 08/27/17  Yes [name], NP  ferrous sulfate 325 (65 FE) MG tablet Take 1 tablet (325 mg total) by mouth 2 (two) times daily with a meal. 07/02/16  Yes Funches, Josalyn, MD  aspirin 81 MG tablet Take 1 tablet (81 mg total) by mouth daily. Patient not taking: Reported on 12/03/2017 07/11/15   Boykin Nearing, MD  cetirizine (ZYRTEC) 10 MG tablet Take 1 tablet (10 mg total) by mouth daily. Patient not taking: Reported on 01/23/2018 02/26/17   Argentina Donovan, PA-C  Cholecalciferol (VITAMIN D3) 2000 units TABS Take 2,000 Units by mouth daily. Patient not taking: Reported on 03/07/2016 07/11/15   Boykin Nearing, MD  megestrol (MEGACE) 40 MG tablet Take 1 tablet (40 mg total) by mouth 2 (two) times daily. 06/02/18   Kaydynce Pat, Vernie Shanks, PA-C  methocarbamol (ROBAXIN) 500 MG tablet Take 1 tablet (500 mg total) by mouth 2 (two) times daily. Patient not taking: Reported on 12/03/2017 11/11/17   Glyn Ade, PA-C  traZODone (DESYREL) 100 MG tablet  Take 1 tablet (100 mg total) by mouth at bedtime. Patient not taking: Reported on 01/23/2018 12/08/17 03/08/18  [name], NP  Vitamin D, Ergocalciferol, (DRISDOL) 50000 units CAPS capsule Take 1 capsule (50,000 Units total) by mouth every 7 (seven) days. For 12 weeks Patient not taking: Reported on 11/05/2016 07/04/16   Boykin Nearing, MD    Family History Family History  Problem Relation Age of Onset  . Hypertension Father   . Diabetes Father     Social History Social History   Tobacco Use  . Smoking status: Former Smoker    Packs/day: 0.50    Years: 25.00    Pack years: 12.50    Types: Cigarettes  . Smokeless tobacco: Never Used  Substance Use Topics  . Alcohol use: Yes    Comment: occasional  . Drug use: No     Allergies   Patient has no known allergies.   Review of  Systems Review of Systems  Ten systems reviewed and are negative for acute change, except as noted in the HPI.   Physical Exam Updated Vital Signs BP (!) 142/98   Pulse 89   Temp 97.9 F (36.6 C) (Oral)   Resp 16   Ht 5' (1.524 m)   Wt 78.9 kg   LMP 05/30/2018   SpO2 100%   BMI 33.98 kg/m   Physical Exam Physical Exam  Nursing note and vitals reviewed. Constitutional: She is oriented to person, place, and time. She appears well-developed and well-nourished. No distress.  HENT:  Head: Normocephalic and atraumatic.  Eyes: Conjunctivae normal and EOM are normal. Pupils are equal, round, and reactive to light. No scleral icterus.  Neck: Normal range of motion.  Cardiovascular: Normal rate, regular rhythm and normal heart sounds.  Exam reveals no gallop and no friction rub.   No murmur heard. Pulmonary/Chest: Effort normal and breath sounds normal. No respiratory distress.  Abdominal: Soft. Bowel sounds are normal. She exhibits no distension and no mass. There is no tenderness. There is no guarding.  Neurological: She is alert and oriented to person, place, and time.  Skin: Skin is warm and dry. She is not diaphoretic.  GU: Pelvic exam: normal external genitalia, vulva, vagina, cervix, uterus and adnexa.-Minimal bleeding from the cervical loss    ED Treatments / Results  Labs (all labs ordered are listed, but only abnormal results are displayed) Labs Reviewed  WET PREP, GENITAL - Abnormal; Notable for the following components:      Result Value   WBC, Wet Prep HPF POC FEW (*)    All other components within normal limits  CBC WITH DIFFERENTIAL/PLATELET - Abnormal; Notable for the following components:   Hemoglobin 10.6 (*)    MCV 73.7 (*)    MCH 20.9 (*)    MCHC 28.4 (*)    RDW 22.4 (*)    All other components within normal limits  COMPREHENSIVE METABOLIC PANEL - Abnormal; Notable for the following components:   Potassium 3.4 (*)    Glucose, Bld 103 (*)    Calcium 8.6  (*)    All other components within normal limits  I-STAT BETA HCG BLOOD, ED ([facility], [facility], [facility] ONLY)  TYPE AND SCREEN  ABO/RH  GC/CHLAMYDIA PROBE AMP ([place]) NOT AT [facility]    EKG None  Radiology No results found.  Procedures Procedures (including critical care time)  Medications Ordered in ED Medications - No data to display   Initial Impression / Assessment and Plan / ED Course  I have reviewed the triage vital signs and the nursing notes.  Pertinent labs & imaging results that were available during my care of the patient were reviewed by me and considered in my medical decision making (see chart for details).        Patient here with vaginal bleeding.  Her white blood cell globin is at baseline.  She is not orthostatic or has any hypotension or dizziness.  Potassium is mildly low.  Few white blood cells seen on her wet prep however there is no evidence of PID or infection.  She will be discharged on Megace.  She is asked to follow closely with her OB/GYN.  Discussed return precautions.  Appears appropriate for discharge at this time.  Final Clinical Impressions(s) / ED Diagnoses   Final diagnoses:  Vaginal bleeding    ED Discharge Orders         Ordered    megestrol (MEGACE) 40 MG tablet  2 times daily     06/02/18 1826           Margarita Mail, PA-C 06/04/18 0003    Dorie Rank, MD 06/07/18 [phone]

## 2018-06-02 NOTE — ED Notes (Signed)
Pelvic cart at bedside. 

## 2018-06-02 NOTE — Discharge Instructions (Addendum)
Get help right away if: You develop a fever or chills. You need to change your sanitary pad or tampon more than one time per hour. Your vaginal bleeding becomes heavier, or your flow contains clots more often. You develop pain in your abdomen. You lose consciousness. You develop a rash.

## 2018-06-02 NOTE — ED Triage Notes (Signed)
Pt states that yesterday she got her cycle on Saturday.  However, pt states that her bleeding got worse yesterday.  Pt states that when she stands up, she is bleeding clots.  Pt reports changing her pad 2-2.5 times an hour. Pt denies any pain right now.  Pt denies feeling dizzy in triage.

## 2018-06-03 LAB — GC/CHLAMYDIA PROBE AMP (~~LOC~~) NOT AT ARMC
Chlamydia: NEGATIVE
Neisseria Gonorrhea: NEGATIVE

## 2018-06-03 MED FILL — AMLODIPINE 2.5 MG TABLET: 2.5 | 30 days supply | Qty: 30 | Fill #1

## 2018-06-03 MED FILL — ATORVASTATIN 20 MG TABLET: 20 | 30 days supply | Qty: 30 | Fill #1

## 2018-06-04 ENCOUNTER — Other Ambulatory Visit: Payer: Self-pay

## 2018-06-04 ENCOUNTER — Ambulatory Visit: Payer: Self-pay | Attending: Pulmonary Disease | Admitting: Internal Medicine

## 2018-06-04 ENCOUNTER — Encounter: Payer: Self-pay | Admitting: Internal Medicine

## 2018-06-04 VITALS — BP 111/78 | HR 77 | Temp 97.8°F | Resp 17

## 2018-06-04 DIAGNOSIS — N920 Excessive and frequent menstruation with regular cycle: Secondary | ICD-10-CM

## 2018-06-04 DIAGNOSIS — N83202 Unspecified ovarian cyst, left side: Secondary | ICD-10-CM

## 2018-06-04 DIAGNOSIS — E782 Mixed hyperlipidemia: Secondary | ICD-10-CM

## 2018-06-04 DIAGNOSIS — E611 Iron deficiency: Secondary | ICD-10-CM

## 2018-06-04 DIAGNOSIS — I10 Essential (primary) hypertension: Secondary | ICD-10-CM

## 2018-06-04 MED ORDER — AMLODIPINE BESYLATE 2.5 MG PO TABS: 2.5000 mg | ORAL_TABLET | Freq: Every day | ORAL | 1 refills | Status: DC

## 2018-06-04 MED ORDER — FERROUS SULFATE 325 (65 FE) MG PO TABS: 325.0000 mg | ORAL_TABLET | Freq: Two times a day (BID) | ORAL | 1 refills | Status: DC

## 2018-06-04 MED ORDER — ATORVASTATIN CALCIUM 20 MG PO TABS: 20.0000 mg | ORAL_TABLET | Freq: Every day | ORAL | 1 refills | Status: DC

## 2018-06-04 NOTE — Progress Notes (Signed)
Patient ID: Amanda Tran, female    DOB: 1965-12-04  MRN: [id number]  CC: Hospitalization Follow-up (ED)   Subjective: Amanda Tran is a 53 y.o. female who presents for urgent care visit/ER follow-up. Her concerns today include:  Patient with history of HTN, IDA,   Patient seen in the emergency room 06/02/2018 with heavy vaginal bleeding.  Patient has regular menses that normally last 5 days with heavy bleeding the first 3 days.  During the first 3 days she has to wear pads and Depends.  She denies any clots or significant cramps with her menstrual cycles.  This month her menses started normally on 05/30/2018 on the third day bleeding was so heavy that she felt like "blood pouring out of me when I stood up."  In the ED pelvic exam was done.  Lab results revealed stable anemia with hemoglobin of 10.6.  Screening test for chlamydia and gonorrhea were negative.  Patient given Megace to take twice a day for 10 days.  She reports that the bleeding slowed down a lot as of yesterday and she has not had any bleeding today.  She is supposed to be on iron but she takes it on average only about twice a week because she dislikes taking pills. Patient had normal Pap smear done 2 months ago. Patient had CT of abdomen and pelvis done in August of last year.  This revealed a 5.5 cm cyst on the left ovary.  Patient needs refill on her blood pressure medicine and cholesterol medicine. Patient Active Problem List   Diagnosis Date Noted  . Shortness of breath 07/02/2016  . Routine screening for STI (sexually transmitted infection) 07/02/2016  . Vaginal irritation 10/06/2015  . Boil 10/06/2015  . HTN (hypertension) 01/09/2015  . Vitamin D insufficiency 12/21/2014  . Fatigue 12/21/2014  . Allergic rhinitis 12/21/2014  . Family history of diabetes mellitus (DM) 03/23/2014  . Iron deficiency anemia 03/18/2012  . Nicotine dependence 03/18/2012     Current Outpatient Medications on File Prior  to Visit  Medication Sig Dispense Refill  . amLODipine (NORVASC) 2.5 MG tablet Take 1 tablet (2.5 mg total) by mouth daily. 30 tablet 2  . aspirin 81 MG tablet Take 1 tablet (81 mg total) by mouth daily. (Patient not taking: Reported on 12/03/2017) 30 tablet 11  . atorvastatin (LIPITOR) 20 MG tablet Take 1 tablet (20 mg total) by mouth daily. 30 tablet 2  . cetirizine (ZYRTEC) 10 MG tablet Take 1 tablet (10 mg total) by mouth daily. (Patient not taking: Reported on 01/23/2018) 30 tablet 11  . Cholecalciferol (VITAMIN D3) 2000 units TABS Take 2,000 Units by mouth daily. (Patient not taking: Reported on 03/07/2016) 30 tablet 11  . ferrous sulfate 325 (65 FE) MG tablet Take 1 tablet (325 mg total) by mouth 2 (two) times daily with a meal. 60 tablet 1  . megestrol (MEGACE) 40 MG tablet Take 1 tablet (40 mg total) by mouth 2 (two) times daily. 20 tablet 0  . methocarbamol (ROBAXIN) 500 MG tablet Take 1 tablet (500 mg total) by mouth 2 (two) times daily. (Patient not taking: Reported on 12/03/2017) 20 tablet 0  . traZODone (DESYREL) 100 MG tablet Take 1 tablet (100 mg total) by mouth at bedtime. (Patient not taking: Reported on 01/23/2018) 90 tablet 2  . Vitamin D, Ergocalciferol, (DRISDOL) 50000 units CAPS capsule Take 1 capsule (50,000 Units total) by mouth every 7 (seven) days. For 12 weeks (Patient not taking: Reported on 11/05/2016) 12  capsule 0   No current facility-administered medications on file prior to visit.     No Known Allergies  Social History   Socioeconomic History  . Marital status: Divorced    Spouse name: Not on file  . Number of children: Not on file  . Years of education: Not on file  . Highest education level: Not on file  Occupational History  . Not on file  Social Needs  . Financial resource strain: Not on file  . Food insecurity:    Worry: Not on file    Inability: Not on file  . Transportation needs:    Medical: Not on file    Non-medical: Not on file  Tobacco Use   . Smoking status: Former Smoker    Packs/day: 0.50    Years: 25.00    Pack years: 12.50    Types: Cigarettes  . Smokeless tobacco: Never Used  Substance and Sexual Activity  . Alcohol use: Yes    Comment: occasional  . Drug use: No  . Sexual activity: Yes    Birth control/protection: Condom  Lifestyle  . Physical activity:    Days per week: Not on file    Minutes per session: Not on file  . Stress: Not on file  Relationships  . Social connections:    Talks on phone: Not on file    Gets together: Not on file    Attends religious service: Not on file    Active member of club or organization: Not on file    Attends meetings of clubs or organizations: Not on file    Relationship status: Not on file  . Intimate partner violence:    Fear of current or ex partner: Not on file    Emotionally abused: Not on file    Physically abused: Not on file    Forced sexual activity: Not on file  Other Topics Concern  . Not on file  Social History Narrative  . Not on file    Family History  Problem Relation Age of Onset  . Hypertension Father   . Diabetes Father     Past Surgical History:  Procedure Laterality Date  . [place], [id number], 1996  . TUBAL LIGATION      ROS: Review of Systems Negative except as stated above  PHYSICAL EXAM: BP 111/78   Pulse 77   Temp 97.8 F (36.6 C) (Oral)   Resp 17   LMP 05/30/2018   SpO2 99%   Physical Exam General appearance - alert, well appearing, and in no distress Mental status - normal mood, behavior, speech, dress, motor activity, and thought processes Abdomen - soft, nontender, nondistended, no masses or organomegaly  CMP Latest Ref Rng & Units 06/02/2018 11/09/2017 05/08/2017  Glucose 70 - 99 mg/dL 103(H) 112(H) 81  BUN 6 - 20 mg/dL 9 9 11   Creatinine 0.44 - 1.00 mg/dL 0.71 0.83 0.86  Sodium 135 - 145 mmol/L 140 140 145(H)  Potassium 3.5 - 5.1 mmol/L 3.4(L) 4.1 3.7  Chloride 98 - 111 mmol/L 107 111 106  CO2  22 - 32 mmol/L 28 22 23   Calcium 8.9 - 10.3 mg/dL 8.6(L) 8.2(L) 9.4  Total Protein 6.5 - 8.1 g/dL 7.4 6.6 7.4  Total Bilirubin 0.3 - 1.2 mg/dL 0.5 0.5 0.2  Alkaline Phos 38 - 126 U/L 80 86 94  AST 15 - 41 U/L 29 24 16   ALT 0 - 44 U/L 18 16 18    Lipid  Panel     Component Value Date/Time   CHOL 227 (H) 05/08/2017 1656   TRIG 126 05/08/2017 1656   HDL 47 05/08/2017 1656   CHOLHDL 4.8 (H) 05/08/2017 1656   CHOLHDL 4.9 03/23/2014 0952   VLDL 11 03/23/2014 0952   LDLCALC 155 (H) 05/08/2017 1656    CBC    Component Value Date/Time   WBC 4.8 06/02/2018 1505   RBC 5.06 06/02/2018 1505   HGB 10.6 (L) 06/02/2018 1505   HGB 11.8 05/08/2017 1656   HCT 37.3 06/02/2018 1505   HCT 37.9 05/08/2017 1656   PLT 355 06/02/2018 1505   PLT 325 05/08/2017 1656   MCV 73.7 (L) 06/02/2018 1505   MCV 78 (L) 05/08/2017 1656   MCH 20.9 (L) 06/02/2018 1505   MCHC 28.4 (L) 06/02/2018 1505   RDW 22.4 (H) 06/02/2018 1505   RDW 17.8 (H) 05/08/2017 1656   LYMPHSABS 1.7 06/02/2018 1505   LYMPHSABS 2.3 02/26/2017 1446   MONOABS 0.4 06/02/2018 1505   EOSABS 0.1 06/02/2018 1505   EOSABS 0.2 02/26/2017 1446   BASOSABS 0.0 06/02/2018 1505   BASOSABS 0.0 02/26/2017 1446    ASSESSMENT AND PLAN:  1. Menorrhagia with regular cycle We will do pelvic ultrasound to evaluate for fibroid or polyp.  She will finish out the Megace that she is currently taking Advised to apply for the orange card or cone discount card so that if she needs referral to gynecology it can be done. - US Pelvic Complete With Transvaginal; Future - US Transvaginal Non-OB; Future  2. Iron deficiency Advised to take the iron every day as prescribed - ferrous sulfate 325 (65 FE) MG tablet; Take 1 tablet (325 mg total) by mouth 2 (two) times daily with a meal.  Dispense: 60 tablet; Refill: 1  3. Cyst of left ovary Pelvic ultrasound planned  4. Essential hypertension - amLODipine (NORVASC) 2.5 MG tablet; Take 1 tablet (2.5 mg total) by  mouth daily.  Dispense: 30 tablet; Refill: 1  5. Mixed hyperlipidemia - atorvastatin (LIPITOR) 20 MG tablet; Take 1 tablet (20 mg total) by mouth daily.  Dispense: 30 tablet; Refill: 1    Patient was given the opportunity to ask questions.  Patient verbalized understanding of the plan and was able to repeat key elements of the plan.   No orders of the defined types were placed in this encounter.    Requested Prescriptions   Pending Prescriptions Disp Refills  . amLODipine (NORVASC) 2.5 MG tablet 30 tablet 1    Sig: Take 1 tablet (2.5 mg total) by mouth daily.  Marland Kitchen atorvastatin (LIPITOR) 20 MG tablet 30 tablet 1    Sig: Take 1 tablet (20 mg total) by mouth daily.  . ferrous sulfate 325 (65 FE) MG tablet 60 tablet 1    Sig: Take 1 tablet (325 mg total) by mouth 2 (two) times daily with a meal.    Return in about 1 month (around 07/05/2018) for vaginal bleeding with Zelda .  Karle Plumber, MD, FACP

## 2018-06-22 ENCOUNTER — Other Ambulatory Visit: Payer: Self-pay

## 2018-06-22 ENCOUNTER — Encounter (HOSPITAL_COMMUNITY): Payer: Self-pay

## 2018-06-22 ENCOUNTER — Ambulatory Visit (HOSPITAL_COMMUNITY)
Admission: EM | Admit: 2018-06-22 | Discharge: 2018-06-22 | Disposition: A | Discharge status: Home / Self Care | Payer: Self-pay | Attending: Family Medicine | Admitting: Family Medicine

## 2018-06-22 DIAGNOSIS — R05 Cough: Secondary | ICD-10-CM

## 2018-06-22 DIAGNOSIS — R059 Cough, unspecified: Secondary | ICD-10-CM

## 2018-06-22 MED ORDER — BENZONATATE 100 MG PO CAPS: 100.0000 mg | ORAL_CAPSULE | Freq: Three times a day (TID) | ORAL | 0 refills | Status: DC

## 2018-06-22 NOTE — ED Provider Notes (Signed)
[facility]    CSN: [id number] Arrival date & time: 06/22/18  1121     History   Chief Complaint Chief Complaint  Patient presents with  . Cough    HPI Amanda Tran is a 53 y.o. female.   Patient is a 53 year old female with past medical history of hypertension, DVT, hyperlipidemia.  She presents today with approximately 3 days of dry cough, body aches, headache.  Reports the symptoms started as body aches and headache and then progressed into cough.  She is unsure of any fevers.  She has had some chills.  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  She also has been taking care of her grandson which is also been sick with a cough. She has not taken anything for the symptoms. No chest pain, SOB.   ROS per HPI      Past Medical History:  Diagnosis Date  . DVT (deep venous thrombosis) ([place]) 03/11/2012   Left popliteal   . Hyperlipidemia   . Hypertension     Patient Active Problem List   Diagnosis Date Noted  . Shortness of breath 07/02/2016  . Routine screening for STI (sexually transmitted infection) 07/02/2016  . Vaginal irritation 10/06/2015  . Boil 10/06/2015  . HTN (hypertension) 01/09/2015  . Vitamin D insufficiency 12/21/2014  . Fatigue 12/21/2014  . Allergic rhinitis 12/21/2014  . Family history of diabetes mellitus (DM) 03/23/2014  . Iron deficiency anemia 03/18/2012  . Nicotine dependence 03/18/2012    Past Surgical History:  Procedure Laterality Date  . [place], [id number], 1996  . TUBAL LIGATION      OB History    Gravida  5   Para      Term      Preterm      AB      Living  5     SAB      TAB      Ectopic      Multiple      Live Births  5            Home Medications    Prior to Admission medications   Medication Sig Start Date End Date Taking? Authorizing Provider  amLODipine (NORVASC) 2.5 MG tablet Take 1 tablet (2.5 mg total) by  mouth daily. 06/04/18   Ladell Pier, MD  atorvastatin (LIPITOR) 20 MG tablet Take 1 tablet (20 mg total) by mouth daily. 06/04/18   Ladell Pier, MD  benzonatate (TESSALON) 100 MG capsule Take 1 capsule (100 mg total) by mouth every 8 (eight) hours. 06/22/18   Loura Halt A, NP  ferrous sulfate 325 (65 FE) MG tablet Take 1 tablet (325 mg total) by mouth 2 (two) times daily with a meal. 06/04/18   Ladell Pier, MD    Family History Family History  Problem Relation Age of Onset  . Hypertension Father   . Diabetes Father     Social History Social History   Tobacco Use  . Smoking status: Former Smoker    Packs/day: 0.50    Years: 25.00    Pack years: 12.50    Types: Cigarettes  . Smokeless tobacco: Never Used  Substance Use Topics  . Alcohol use: Yes    Comment: occasional  . Drug use: No     Allergies   Patient has no known allergies.   Review of Systems Review of Systems  Physical Exam Triage Vital Signs ED Triage Vitals  Enc Vitals Group     BP 06/22/18 1134 (!) 131/97     Pulse Rate 06/22/18 1134 87     Resp 06/22/18 1134 17     Temp 06/22/18 1134 99.3 F (37.4 C)     Temp Source 06/22/18 1134 Oral     SpO2 06/22/18 1134 100 %     Weight --      Height --      Head Circumference --      Peak Flow --      Pain Score 06/22/18 1131 0     Pain Loc --      Pain Edu? --      Excl. in [place]? --    No data found.  Updated Vital Signs BP (!) 131/97 (BP Location: Right Arm)   Pulse 87   Temp 99.3 F (37.4 C) (Oral)   Resp 17   LMP 05/30/2018   SpO2 100%   Visual Acuity Right Eye Distance:   Left Eye Distance:   Bilateral Distance:    Right Eye Near:   Left Eye Near:    Bilateral Near:     Physical Exam Vitals signs and nursing note reviewed.  Constitutional:      General: She is not in acute distress.    Appearance: Normal appearance. She is well-developed. She is not ill-appearing, toxic-appearing or diaphoretic.  HENT:     Head:  Normocephalic and atraumatic.     Right Ear: Tympanic membrane and ear canal normal.     Left Ear: Tympanic membrane and ear canal normal.     Nose: Nose normal.     Mouth/Throat:     Pharynx: Oropharynx is clear.  Eyes:     Conjunctiva/sclera: Conjunctivae normal.  Neck:     Musculoskeletal: Normal range of motion.  Cardiovascular:     Rate and Rhythm: Normal rate and regular rhythm.     Heart sounds: No murmur.  Pulmonary:     Effort: Pulmonary effort is normal. No respiratory distress.     Breath sounds: Normal breath sounds.  Musculoskeletal: Normal range of motion.  Skin:    General: Skin is warm and dry.  Neurological:     Mental Status: She is alert.  Psychiatric:        Mood and Affect: Mood normal.      UC Treatments / Results  Labs (all labs ordered are listed, but only abnormal results are displayed) Labs Reviewed - No data to display  EKG None  Radiology No results found.  Procedures Procedures (including critical care time)  Medications Ordered in UC Medications - No data to display  Initial Impression / Assessment and Plan / UC Course  I have reviewed the triage vital signs and the nursing notes.  Pertinent labs & imaging results that were available during my care of the patient were reviewed by me and considered in my medical decision making (see chart for details).    Cough-   Exam normal Pt with low grade fever and dry cough Cannot rule out COVID We will have her quarantine for now [place] over precautions.  Follow up as needed for continued or worsening symptoms  Final Clinical Impressions(s) / UC Diagnoses   Final diagnoses:  Cough     Discharge Instructions     Your symptoms are consistent with viral illness  We cannot rule out corona virus I would like for you to quarantine yourself and  call us in 3 days if not better Tessalon pearls for cough. You can take ibuprofen for pain and fever.  Follow up as needed for continued or  worsening symptoms     ED Prescriptions    Medication Sig Dispense Auth. Provider   benzonatate (TESSALON) 100 MG capsule Take 1 capsule (100 mg total) by mouth every 8 (eight) hours. 21 capsule Loura Halt A, NP     Controlled Substance Prescriptions [place] Controlled Substance Registry consulted? Not Applicable   Orvan July, NP 06/22/18 1337

## 2018-06-22 NOTE — ED Triage Notes (Signed)
Patient presents to Urgent Care with complaints of not feeling well since yesterday, with non-productive cough. 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 Pt denies taking otc meds.

## 2018-06-22 NOTE — Discharge Instructions (Addendum)
Your symptoms are consistent with viral illness  We cannot rule out corona virus I would like for you to quarantine yourself and call us in 3 days if not better Tessalon pearls for cough. You can take ibuprofen for pain and fever.  Follow up as needed for continued or worsening symptoms

## 2018-07-04 NOTE — Telephone Encounter (Signed)
done

## 2018-07-10 ENCOUNTER — Other Ambulatory Visit: Payer: Self-pay

## 2018-07-10 ENCOUNTER — Ambulatory Visit: Payer: PRIVATE HEALTH INSURANCE | Attending: Nurse Practitioner | Admitting: Nurse Practitioner

## 2018-07-10 ENCOUNTER — Encounter: Payer: Self-pay | Admitting: Nurse Practitioner

## 2018-07-10 DIAGNOSIS — B9689 Other specified bacterial agents as the cause of diseases classified elsewhere: Secondary | ICD-10-CM

## 2018-07-10 DIAGNOSIS — N760 Acute vaginitis: Secondary | ICD-10-CM

## 2018-07-10 MED ORDER — METRONIDAZOLE 0.75 % VA GEL: 1.0000 | Freq: Two times a day (BID) | VAGINAL | 0 refills | Status: DC

## 2018-07-10 NOTE — Progress Notes (Signed)
Virtual Visit via Telephone Note  I connected with [name] on 07/12/18 at  4:35 PM EDT by telephone and verified that I am speaking with the correct person using two identifiers.   I discussed the limitations, risks, security and privacy concerns of performing an evaluation and management service by telephone and the availability of in person appointments. I also discussed with the patient that there may be a patient responsible charge related to this service. The patient expressed understanding and agreed to proceed.   History of Present Illness: She has complaints today of recurrent BV symptoms after not completing her flagyl that was prescribed due to GI upset. She still has not had her Pelvic US performed. She has a history of anemia secondary to menorrhagia with metrorrhagia. CT of abdomen and pelvis in 2019 revealed a 5.5 cm cyst on the left ovary.   Observations/Objective: Awake, alert and oriented x 3.   Assessment and Plan: Diagnoses and all orders for this visit:  Bacterial vaginosis -     metroNIDAZOLE (METROGEL VAGINAL) 0.75 % vaginal gel; Place 1 Applicatorful vaginally 2 (two) times daily for 5 days.    Follow Up Instructions: Return if symptoms worsen or fail to improve.   I discussed the assessment and treatment plan with the patient. The patient was provided an opportunity to ask questions and all were answered. The patient agreed with the plan and demonstrated an understanding of the instructions.   The patient was advised to call back or seek an in-person evaluation if the symptoms worsen or if the condition fails to improve as anticipated.  I provided 10 minutes of non-face-to-face time during this encounter.   [name], NP

## 2018-07-12 ENCOUNTER — Encounter: Payer: Self-pay | Admitting: Nurse Practitioner

## 2018-07-13 ENCOUNTER — Telehealth: Payer: Self-pay | Admitting: Nurse Practitioner

## 2018-07-13 NOTE — Telephone Encounter (Signed)
Will route to PCP 

## 2018-07-13 NOTE — Telephone Encounter (Signed)
Patient called in regards to the vaginal gel she was prescribed. Patient states she cannot afford it and would like to know if she can be given something more cheaper or a substitute. Please follow up

## 2018-07-13 NOTE — Telephone Encounter (Signed)
Patient states she was referred to get a US done at the med center in [place] however, they are not seeing patients until June and the order will have to be changed and sent elsewhere in order for it to be done sooner. Please follow up

## 2018-07-14 ENCOUNTER — Other Ambulatory Visit: Payer: Self-pay | Admitting: Nurse Practitioner

## 2018-07-14 MED ORDER — CLINDAMYCIN HCL 300 MG PO CAPS: 300.0000 mg | ORAL_CAPSULE | Freq: Two times a day (BID) | ORAL | 0 refills | Status: AC

## 2018-07-14 NOTE — Telephone Encounter (Signed)
Will send clindamycin which is a pill. If this is too expensive there would be no other options.

## 2018-07-14 NOTE — Telephone Encounter (Signed)
CMA spoke to PCP and PCP stated patient can get the ultrasound schedule in June.  Pt. Understood.

## 2018-07-14 NOTE — Telephone Encounter (Signed)
CMA spoke to patient to inform changes of medication due to pricing. Patient understood.

## 2018-08-25 ENCOUNTER — Ambulatory Visit (HOSPITAL_COMMUNITY)
Admission: RE | Admit: 2018-08-25 | Discharge: 2018-08-25 | Disposition: A | Discharge status: Home / Self Care | Payer: PRIVATE HEALTH INSURANCE | Source: Ambulatory Visit | Attending: Internal Medicine | Admitting: Internal Medicine

## 2018-08-25 ENCOUNTER — Other Ambulatory Visit: Payer: Self-pay

## 2018-08-25 DIAGNOSIS — N920 Excessive and frequent menstruation with regular cycle: Secondary | ICD-10-CM | POA: Insufficient documentation

## 2018-08-26 ENCOUNTER — Other Ambulatory Visit: Payer: Self-pay | Admitting: Internal Medicine

## 2018-08-26 DIAGNOSIS — N920 Excessive and frequent menstruation with regular cycle: Secondary | ICD-10-CM

## 2018-08-31 NOTE — Progress Notes (Signed)
CMA attempt to reach patient to inform on ultrasound results and referral.  No answer and unable to leave a VM due to mailbox is full.

## 2018-09-15 ENCOUNTER — Telehealth (HOSPITAL_BASED_OUTPATIENT_CLINIC_OR_DEPARTMENT_OTHER): Payer: Self-pay | Admitting: Nurse Practitioner

## 2018-09-15 ENCOUNTER — Encounter: Payer: Self-pay | Admitting: Nurse Practitioner

## 2018-09-15 DIAGNOSIS — M67479 Ganglion, unspecified ankle and foot: Secondary | ICD-10-CM

## 2018-09-15 DIAGNOSIS — M67471 Ganglion, right ankle and foot: Secondary | ICD-10-CM

## 2018-09-15 NOTE — Progress Notes (Signed)
Virtual Visit via Telephone Note Due to national recommendations of social distancing due to [place] [address], [facility] visit is felt to be most appropriate for this patient at this time.  I discussed the limitations, risks, security and privacy concerns of performing an evaluation and management service by [facility] and the availability of in person appointments. I also discussed with the patient that there may be a patient responsible charge related to this service. The patient expressed understanding and agreed to proceed.    I connected with [name] on 09/15/18  at   3:10 PM EDT  EDT by telephone and verified that I am speaking with the correct person using two identifiers.   Consent I discussed the limitations, risks, security and privacy concerns of performing an evaluation and management service by [facility] and the availability of in person appointments. I also discussed with the patient that there may be a patient responsible charge related to this service. The patient expressed understanding and agreed to proceed.   Location of Patient: [place], arts of Provider: [place] and [place] participating in Telemedicine visit: [name] FNP-BC [place]    History of Present Illness: Telemedicine visit for: right foot nodule  Foot Lesion: Patient complains of a skin lesion of the right plantar foot. The lesion has been present for several months. Lesion has not changed since onset. Symptoms associated with the lesion are: unsightliness. Patient denies increase in size, thickness, darkening color, itching, bleeding, pain, drainage, infection.    Past Medical History:  Diagnosis Date  . DVT (deep venous thrombosis) ([place]) 03/11/2012   Left popliteal   . Hyperlipidemia   . Hypertension     Past Surgical History:  Procedure Laterality Date  . [place], [id number], 1996  . TUBAL LIGATION       Family History  Problem Relation Age of Onset  . Hypertension Father   . Diabetes Father     Social History   Socioeconomic History  . Marital status: Divorced    Spouse name: Not on file  . Number of children: Not on file  . Years of education: Not on file  . Highest education level: Not on file  Occupational History  . Not on file  Social Needs  . Financial resource strain: Not on file  . Food insecurity    Worry: Not on file    Inability: Not on file  . Transportation needs    Medical: Not on file    Non-medical: Not on file  Tobacco Use  . Smoking status: Former Smoker    Packs/day: 0.50    Years: 25.00    Pack years: 12.50    Types: Cigarettes  . Smokeless tobacco: Never Used  Substance and Sexual Activity  . Alcohol use: Yes    Comment: occasional  . Drug use: No  . Sexual activity: Yes    Birth control/protection: Condom  Lifestyle  . Physical activity    Days per week: Not on file    Minutes per session: Not on file  . Stress: Not on file  Relationships  . Social Herbalist on phone: Not on file    Gets together: Not on file    Attends religious service: Not on file    Active member of club or organization: Not on file    Attends meetings of clubs or organizations: Not on file  Relationship status: Not on file  Other Topics Concern  . Not on file  Social History Narrative  . Not on file     Observations/Objective: Awake, alert and oriented x 3   ROS  Assessment and Plan: Diagnoses and all orders for this visit:  Ganglion cyst of foot -     Ambulatory referral to Podiatry     Follow Up Instructions Return in about 3 months (around 12/16/2018).     I discussed the assessment and treatment plan with the patient. The patient was provided an opportunity to ask questions and all were answered. The patient agreed with the plan and demonstrated an understanding of the instructions.   The patient was advised to call back or seek an  in-person evaluation if the symptoms worsen or if the condition fails to improve as anticipated.  I provided 16 minutes of non-face-to-face time during this encounter including median intraservice time, reviewing previous notes, labs, imaging, medications and explaining diagnosis and management.  [name], FNP-BC

## 2018-09-16 ENCOUNTER — Telehealth: Payer: Self-pay

## 2018-09-16 NOTE — Telephone Encounter (Signed)
The patient called in for appointment information, stated her phone was recently disconnected. Informed the patient of the upcoming appointment and educated how to access the visit via mychart. Also sending an appointment reminder with instructions.

## 2018-09-18 MED FILL — FERROUS SULFATE 325 MG TAB: 325 (65 FE) | 30 days supply | Qty: 60 | Fill #0

## 2018-09-18 MED FILL — ATORVASTATIN 20 MG TABLET: 20 | 30 days supply | Qty: 30 | Fill #0

## 2018-09-18 MED FILL — AMLODIPINE BESYLATE 2.5 MG: 2.5 | 30 days supply | Qty: 30 | Fill #0

## 2018-09-22 ENCOUNTER — Other Ambulatory Visit: Payer: Self-pay

## 2018-09-23 ENCOUNTER — Encounter: Payer: Self-pay | Admitting: Gynecology

## 2018-09-23 ENCOUNTER — Ambulatory Visit (INDEPENDENT_AMBULATORY_CARE_PROVIDER_SITE_OTHER): Payer: No Typology Code available for payment source | Admitting: Gynecology

## 2018-09-23 VITALS — BP 126/84 | Ht 62.0 in | Wt 192.0 lb

## 2018-09-23 DIAGNOSIS — N840 Polyp of corpus uteri: Secondary | ICD-10-CM | POA: Diagnosis not present

## 2018-09-23 NOTE — Patient Instructions (Signed)
Office will call you to arrange for surgery. 

## 2018-09-23 NOTE — Progress Notes (Signed)
    ADDISYNN VASSELL 06/13/1965 [id number]        53 y.o.  G5P5 new patient seen in referral due to history of regular monthly menses until March of this year when her period started at the right time but was very heavy with blood pouring out leading to ER evaluation.  Hemoglobin was found to be 10.6.  She was overall stable.  Follow-up ultrasound showed fluid within the endometrial cavity outlining a clearly visible endometrial polyp measuring 9 x 8 x 5 mm with blood flow to the polyp demonstrated on Doppler.  She notes though since then her last 2 menses have been lighter and regular.  History of cesarean section x5 with BTL.  Recent Pap smear 03/2018 normal cytology.  Past medical history,surgical history, problem list, medications, allergies, family history and social history were all reviewed and documented in the EPIC chart.  Directed ROS with pertinent positives and negatives documented in the history of present illness/assessment and plan.  Exam: Caryn Bee assistant Vitals:   09/23/18 1155  BP: 126/84  Weight: 192 lb (87.1 kg)  Height: 5\' 2"  (1.575 m)   General appearance:  Normal Abdomen soft nontender without masses guarding rebound Pelvic external BUS vagina normal.  Cervix normal.  Uterus generous in size midline mobile nontender.  Adnexa without masses or tenderness.  Assessment/Plan:  53 y.o. G5P0 with history of menorrhagia leading to ultrasound demonstrating clear endometrial polyp with feeder vessel.  Her subsequent menses were lighter.  We discussed the issue of endometrial polyps and pathology potential to include most likely benign polyp but possible hyperplastic with or without atypia up to and including uterine cancer although unlikely in a regularly menstruating female.  My recommendation is to proceed with hysteroscopy D&C with resection of the polyp for histologic evaluation.  We discussed what is involved with the procedure to include the intraoperative and  postoperative state.  General risks to include infection, hemorrhage necessitating transfusion, damage to surrounding tissues such as vagina cervix and potential for uterine perforation with damage to internal organs necessitating major exploratory reparative surgeries was all discussed.  She does have a history of DVT 2013.  I did discuss with her a short course of Lovenox anticoagulation preop and for the week following surgery.  Patient agrees with the plan and will move towards scheduling her surgery and follow-up for preoperative consult before hand.   Anastasio Auerbach MD, 12:52 PM 09/23/2018

## 2018-09-28 ENCOUNTER — Telehealth: Payer: Self-pay

## 2018-09-28 NOTE — Telephone Encounter (Signed)
Left message for patient to call.

## 2018-09-30 ENCOUNTER — Telehealth: Payer: Self-pay

## 2018-09-30 NOTE — Telephone Encounter (Signed)
She has an endometrial polyp demonstrated on ultrasound.  Final pathology from that is unknown.  You could do an endometrial biopsy only but it may not sampled the polyp and again the disclaimer as it may not be representative of pathology.  The bottom line is if she does not have the hysteroscopy D&C then there are no guarantees as far as final pathology and she would have to be comfortable with that decision.

## 2018-09-30 NOTE — Telephone Encounter (Signed)
Spoke with patient and informed her reading her Dr. Dorette Grate reply.  She voiced understanding.

## 2018-09-30 NOTE — Telephone Encounter (Signed)
Patient returned my call. I explained to her that I checked her ins benefits for surgery.  Her insurance policy has a pre-existing waiver attached with a look back period of 24 mos.  They would ask for records when claim is received and would not cover anything that had been treated, advice given, seen for, etc. Prior to effective date of insurance policy on 1/96/22.  I told her I am concerned they will not cover her surgery.  Patient said she cannot afford to have surgery if she has to pay for it all.  Patient asked me to ask you what else you might recommend. I did review the disclaimer about not being able to know if polyp/endometrium is benign without removing it.    She has not bled since March.

## 2018-10-01 ENCOUNTER — Telehealth: Payer: Self-pay | Admitting: Obstetrics & Gynecology

## 2018-10-01 NOTE — Telephone Encounter (Signed)
Called the patient to complete the pre-screen. The patient answered no to COVID19 symptoms and/or being previously diagnosed. Informed the patient of the wearing a face mask, sanitizing hands at the sanitizing station upon entering our office, and no visitors or children are allowed due to the COVID19 restrictions. The patient verbalized understanding. °

## 2018-10-02 ENCOUNTER — Other Ambulatory Visit: Payer: Self-pay

## 2018-10-02 ENCOUNTER — Telehealth (INDEPENDENT_AMBULATORY_CARE_PROVIDER_SITE_OTHER): Payer: Self-pay | Admitting: Family Medicine

## 2018-10-02 DIAGNOSIS — N920 Excessive and frequent menstruation with regular cycle: Secondary | ICD-10-CM

## 2018-10-02 DIAGNOSIS — N840 Polyp of corpus uteri: Secondary | ICD-10-CM

## 2018-10-02 NOTE — Progress Notes (Signed)
I connected with  Amanda Tran on 10/02/18 at 10:35 AM EDT by telephone and verified that I am speaking with the correct person using two identifiers.   I discussed the limitations, risks, security and privacy concerns of performing an evaluation and management service by telephone and the availability of in person appointments. I also discussed with the patient that there may be a patient responsible charge related to this service. The patient expressed understanding and agreed to proceed.  [place], [place] 10/02/2018  10:29 AM

## 2018-10-02 NOTE — Progress Notes (Signed)
    TELEHEALTH GYNECOLOGY VIRTUAL VIDEO VISIT ENCOUNTER NOTE  Provider location: Center for [facility] at [place] connected with [name] on 10/02/18 at 10:35 AM EDT by MyChart Video Encounter at home and verified that I am speaking with the correct person using two identifiers.   I discussed the limitations, risks, security and privacy concerns of performing an evaluation and management service virtually and the availability of in person appointments. I also discussed with the patient that there may be a patient responsible charge related to this service. The patient expressed understanding and agreed to proceed.   History:  Amanda Tran is a 53 y.o. G18P0 female being evaluated today for AUB. Patient seen in march in ED for heavy periods, changing pad every couple of hours. Had two cycles like this. Was prescribed megace, which she took twice, then had normal flow. Since March, has had normal periods. No bleeding between menses. Had [place] - 1.5cm fibroid, small endometrial polyp. Endometrium 52mm. She denies any abnormal vaginal discharge, pelvic pain or other concerns.       Past Medical History:  Diagnosis Date  . DVT (deep venous thrombosis) ([place]) 03/11/2012   Left popliteal   . Hyperlipidemia   . Hypertension    Past Surgical History:  Procedure Laterality Date  . [place], [id number], 1996  . TUBAL LIGATION     The following portions of the patient's history were reviewed and updated as appropriate: allergies, current medications, past family history, past medical history, past social history, past surgical history and problem list.   Health Maintenance:  Normal pap on 03/31/2018.  Normal mammogram on 02/26/2018.   Review of Systems:  Pertinent items noted in HPI and remainder of comprehensive ROS otherwise negative.  Physical Exam:   General:  Alert, oriented and cooperative. Patient appears to be in no acute distress.  Mental  Status: Normal mood and affect. Normal behavior. Normal judgment and thought content.   Respiratory: Normal respiratory effort, no problems with respiration noted  Rest of physical exam deferred due to type of encounter  Labs and Imaging No results found for this or any previous visit (from the past 336 hour(s)). No results found.     Assessment and Plan:     1. Menorrhagia with regular cycle Periods have returned to normal. No indication for endometrial sampling.  2. Endometrial polyp Normal finding       I discussed the assessment and treatment plan with the patient. The patient was provided an opportunity to ask questions and all were answered. The patient agreed with the plan and demonstrated an understanding of the instructions.   The patient was advised to call back or seek an in-person evaluation/go to the ED if the symptoms worsen or if the condition fails to improve as anticipated.  I provided 15 minutes of face-to-face time during this encounter.   [place] for [employer], [place]

## 2018-10-02 NOTE — Progress Notes (Signed)
10:02 I called [place] and left a message I am calling for your virtual visit - I am calling early and will call again closer to  Her appointment time; please be available by phone.  Linda,RN

## 2018-10-06 ENCOUNTER — Other Ambulatory Visit: Payer: Self-pay | Admitting: Podiatry

## 2018-10-06 ENCOUNTER — Encounter: Payer: Self-pay | Admitting: Podiatry

## 2018-10-06 ENCOUNTER — Ambulatory Visit (INDEPENDENT_AMBULATORY_CARE_PROVIDER_SITE_OTHER): Payer: No Typology Code available for payment source | Admitting: Podiatry

## 2018-10-06 ENCOUNTER — Ambulatory Visit (INDEPENDENT_AMBULATORY_CARE_PROVIDER_SITE_OTHER): Payer: No Typology Code available for payment source

## 2018-10-06 ENCOUNTER — Other Ambulatory Visit: Payer: Self-pay

## 2018-10-06 VITALS — BP 103/71 | HR 81 | Temp 97.3°F | Resp 16

## 2018-10-06 DIAGNOSIS — M722 Plantar fascial fibromatosis: Secondary | ICD-10-CM

## 2018-10-06 DIAGNOSIS — M778 Other enthesopathies, not elsewhere classified: Secondary | ICD-10-CM

## 2018-10-06 NOTE — Progress Notes (Signed)
  Subjective:  Patient ID: Amanda Tran, female    DOB: 11-30-1965,  MRN: [id number] HPI Chief Complaint  Patient presents with  . Foot Pain    Arch right - knot x 5-6 months, getting larger, not painful, just concerned about it  . New Patient (Initial Visit)    53 y.o. female presents with the above complaint.   ROS: Denies fever chills nausea vomiting muscle aches pains calf pain back pain chest pain shortness of breath.  Past Medical History:  Diagnosis Date  . DVT (deep venous thrombosis) ([place]) 03/11/2012   Left popliteal   . Hyperlipidemia   . Hypertension    Past Surgical History:  Procedure Laterality Date  . [place], [id number], 1996  . TUBAL LIGATION      Current Outpatient Medications:  .  amLODipine (NORVASC) 2.5 MG tablet, Take 1 tablet (2.5 mg total) by mouth daily., Disp: 30 tablet, Rfl: 1 .  atorvastatin (LIPITOR) 20 MG tablet, Take 1 tablet (20 mg total) by mouth daily., Disp: 30 tablet, Rfl: 1 .  ferrous sulfate 325 (65 FE) MG tablet, Take 1 tablet (325 mg total) by mouth 2 (two) times daily with a meal., Disp: 60 tablet, Rfl: 1  No Known Allergies Review of Systems Objective:   Vitals:   10/06/18 1008  BP: 103/71  Pulse: 81  Resp: 16  Temp: (!) 97.3 F (36.3 C)    General: Well developed, nourished, in no acute distress, alert and oriented x3   Dermatological: Skin is warm, dry and supple bilateral. Nails x 10 are well maintained; remaining integument appears unremarkable at this time. There are no open sores, no preulcerative lesions, no rash or signs of infection present.  Vascular: Dorsalis Pedis artery and Posterior Tibial artery pedal pulses are 2/4 bilateral with immedate capillary fill time. Pedal hair growth present. No varicosities and no lower extremity edema present bilateral.   Neruologic: Grossly intact via light touch bilateral. Vibratory intact via tuning fork bilateral. Protective threshold with Semmes  Wienstein monofilament intact to all pedal sites bilateral. Patellar and Achilles deep tendon reflexes 2+ bilateral. No Babinski or clonus noted bilateral.   Musculoskeletal: No gross boney pedal deformities bilateral. No pain, crepitus, or limitation noted with foot and ankle range of motion bilateral. Muscular strength 5/5 in all groups tested bilateral.  Gait: Unassisted, Nonantalgic.    Radiographs:  Radiographs taken today demonstrate a rectus foot type mild pes planus soft tissue lesion plantar aspect of the right foot within the fascia.  No calcification noted.  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  Plan: Discussed pros and cons of injection therapy and excision she like to do neither at this point that is since it is nonsymptomatic [place].     Max T. Moore, [place]

## 2018-12-04 ENCOUNTER — Ambulatory Visit: Payer: PRIVATE HEALTH INSURANCE | Admitting: Nurse Practitioner

## 2018-12-04 ENCOUNTER — Encounter (HOSPITAL_COMMUNITY): Payer: Self-pay

## 2018-12-04 ENCOUNTER — Other Ambulatory Visit: Payer: Self-pay

## 2018-12-04 ENCOUNTER — Ambulatory Visit (HOSPITAL_COMMUNITY)
Admission: EM | Admit: 2018-12-04 | Discharge: 2018-12-04 | Disposition: A | Discharge status: Home / Self Care | Payer: Self-pay | Attending: Emergency Medicine | Admitting: Emergency Medicine

## 2018-12-04 ENCOUNTER — Emergency Department (HOSPITAL_COMMUNITY)
Admission: EM | Admit: 2018-12-04 | Discharge: 2018-12-04 | Discharge status: Absent without leave | Payer: PRIVATE HEALTH INSURANCE

## 2018-12-04 DIAGNOSIS — N898 Other specified noninflammatory disorders of vagina: Secondary | ICD-10-CM

## 2018-12-04 DIAGNOSIS — Z202 Contact with and (suspected) exposure to infections with a predominantly sexual mode of transmission: Secondary | ICD-10-CM

## 2018-12-04 MED ORDER — METRONIDAZOLE 500 MG PO TABS: 500.0000 mg | ORAL_TABLET | Freq: Two times a day (BID) | ORAL | 0 refills | Status: DC

## 2018-12-04 MED ORDER — CEFTRIAXONE SODIUM 250 MG IJ SOLR
INTRAMUSCULAR | Status: AC
  Filled 2018-12-04: qty 250

## 2018-12-04 MED ORDER — AZITHROMYCIN 250 MG PO TABS
1000.0000 mg | ORAL_TABLET | Freq: Once | ORAL | Status: AC
  Administered 2018-12-04: 1000 mg via ORAL

## 2018-12-04 MED ORDER — CEFTRIAXONE SODIUM 250 MG IJ SOLR
250.0000 mg | Freq: Once | INTRAMUSCULAR | Status: AC
  Administered 2018-12-04: 250 mg via INTRAMUSCULAR

## 2018-12-04 MED ORDER — AZITHROMYCIN 250 MG PO TABS
ORAL_TABLET | ORAL | Status: AC
  Filled 2018-12-04: qty 4

## 2018-12-04 NOTE — ED Provider Notes (Signed)
[place]    CSN: [id number] Arrival date & time: 12/04/18  [id number]      History   Chief Complaint Chief Complaint  Patient presents with  . vaginal odor    HPI [name] is a 53 y.o. female.   Patient presents with malodorous vaginal discharge.  She states she had her menstrual cycle last week and at the end of it noticed the odor.  She states she treated it with over-the-counter 7-day course of Monistat which improved her symptoms.  She is sexually active with one partner, no condoms.  She is here today for an STD testing and treatment; she has a history of BV and trichomonas.  She denies fever, chills, rash, lesions, pelvic pain, dysuria, abdominal pain, back pain, or other symptoms.  LMP: 1 week.     The history is provided by the patient.    Past Medical History:  Diagnosis Date  . DVT (deep venous thrombosis) ([place]) 03/11/2012   Left popliteal   . Hyperlipidemia   . Hypertension     Patient Active Problem List   Diagnosis Date Noted  . Shortness of breath 07/02/2016  . Routine screening for STI (sexually transmitted infection) 07/02/2016  . Vaginal irritation 10/06/2015  . Boil 10/06/2015  . HTN (hypertension) 01/09/2015  . Vitamin D insufficiency 12/21/2014  . Fatigue 12/21/2014  . Allergic rhinitis 12/21/2014  . Family history of diabetes mellitus (DM) 03/23/2014  . Iron deficiency anemia 03/18/2012  . Nicotine dependence 03/18/2012    Past Surgical History:  Procedure Laterality Date  . [place], [id number], 1996  . TUBAL LIGATION      OB History    Gravida  5   Para      Term      Preterm      AB      Living  5     SAB      TAB      Ectopic      Multiple      Live Births  5            Home Medications    Prior to Admission medications   Medication Sig Start Date End Date Taking? Authorizing Provider  amLODipine (NORVASC) 2.5 MG tablet Take 1 tablet (2.5 mg total) by mouth daily.  06/04/18  Yes Ladell Pier, MD  atorvastatin (LIPITOR) 20 MG tablet Take 1 tablet (20 mg total) by mouth daily. 06/04/18  Yes Ladell Pier, MD  ferrous sulfate 325 (65 FE) MG tablet Take 1 tablet (325 mg total) by mouth 2 (two) times daily with a meal. 06/04/18  Yes Ladell Pier, MD  metroNIDAZOLE (FLAGYL) 500 MG tablet Take 1 tablet (500 mg total) by mouth 2 (two) times daily. 12/04/18   Sharion Balloon, NP    Family History Family History  Problem Relation Age of Onset  . Hypertension Father   . Diabetes Father   . Kidney failure Mother     Social History Social History   Tobacco Use  . Smoking status: Former Smoker    Packs/day: 0.50    Years: 25.00    Pack years: 12.50    Types: Cigarettes  . Smokeless tobacco: Never Used  Substance Use Topics  . Alcohol use: Yes    Comment: occasional  . Drug use: No     Allergies   Patient has no known allergies.   Review of Systems Review of Systems  Constitutional: Negative for chills and fever.  HENT: Negative for ear pain and sore throat.   Eyes: Negative for pain and visual disturbance.  Respiratory: Negative for cough and shortness of breath.   Cardiovascular: Negative for chest pain and palpitations.  Gastrointestinal: Negative for abdominal pain and vomiting.  Genitourinary: Positive for vaginal discharge. Negative for dysuria, flank pain, hematuria and pelvic pain.  Musculoskeletal: Negative for arthralgias and back pain.  Skin: Negative for color change and rash.  Neurological: Negative for seizures and syncope.  All other systems reviewed and are negative.    Physical Exam Triage Vital Signs ED Triage Vitals [12/04/18 1032]  Enc Vitals Group     BP      Pulse      Resp      Temp      Temp src      SpO2      Weight      Height      Head Circumference      Peak Flow      Pain Score 0     Pain Loc      Pain Edu?      Excl. in [place]?    No data found.  Updated Vital Signs BP (!) 136/99 (BP  Location: Right Arm)   Pulse 76   Temp 97.9 F (36.6 C) (Oral)   Resp 18   SpO2 100%   Visual Acuity Right Eye Distance:   Left Eye Distance:   Bilateral Distance:    Right Eye Near:   Left Eye Near:    Bilateral Near:     Physical Exam Vitals signs and nursing note reviewed.  Constitutional:      General: She is not in acute distress.    Appearance: She is well-developed.  HENT:     Head: Normocephalic and atraumatic.     Mouth/Throat:     Mouth: Mucous membranes are moist.     Pharynx: Oropharynx is clear.  Eyes:     Conjunctiva/sclera: Conjunctivae normal.  Neck:     Musculoskeletal: Neck supple.  Cardiovascular:     Rate and Rhythm: Normal rate and regular rhythm.     Heart sounds: No murmur.  Pulmonary:     Effort: Pulmonary effort is normal. No respiratory distress.     Breath sounds: Normal breath sounds.  Abdominal:     General: Bowel sounds are normal.     Palpations: Abdomen is soft.     Tenderness: There is no abdominal tenderness. There is no right CVA tenderness, left CVA tenderness, guarding or rebound.  Skin:    General: Skin is warm and dry.     Findings: No rash.  Neurological:     Mental Status: She is alert.      UC Treatments / Results  Labs (all labs ordered are listed, but only abnormal results are displayed) Labs Reviewed  CERVICOVAGINAL ANCILLARY ONLY    EKG   Radiology No results found.  Procedures Procedures (including critical care time)  Medications Ordered in UC Medications  azithromycin (ZITHROMAX) tablet 1,000 mg (has no administration in time range)  cefTRIAXone (ROCEPHIN) injection 250 mg (has no administration in time range)    Initial Impression / Assessment and Plan / UC Course  I have reviewed the triage vital signs and the nursing notes.  Pertinent labs & imaging results that were available during my care of the patient were reviewed by me and considered in my medical decision making (see chart for  details).    Vaginal discharge, possible exposure to STD.  Treating with Rocephin, Zithromax, metronidazole.  Patient declines HIV and RPR tests.  Vaginal swab for STD tests obtained here.  Instructed patient not to have sex for 7 days.  Discussed that we will call her if her STD tests are positive and that her partner may need treatment at that time.  Patient agrees with plan of care.     Final Clinical Impressions(s) / UC Diagnoses   Final diagnoses:  Vaginal discharge  Possible exposure to STD     Discharge Instructions     You were treated with two antibiotics today, Rocephin and Zithromax.  Additionally, you are prescribed metronidazole; take this twice a day for 7 days.    Do not have sex for 7 days.  Your STD tests are pending.  If your test results are positive, we will call you.  You may need additional treatment and your partner may also need treatment.          ED Prescriptions    Medication Sig Dispense Auth. Provider   metroNIDAZOLE (FLAGYL) 500 MG tablet Take 1 tablet (500 mg total) by mouth 2 (two) times daily. 14 tablet Sharion Balloon, NP     Controlled Substance Prescriptions [place] Controlled Substance Registry consulted? Not Applicable   Sharion Balloon, NP 12/04/18 1058

## 2018-12-04 NOTE — ED Triage Notes (Signed)
Patient presents to Urgent Care with complaints of vaginal odor since this morning. Patient reports her period just finished yesterday/today and she knows that can smell occasionally but this is much worse.

## 2018-12-04 NOTE — Discharge Instructions (Addendum)
You were treated with two antibiotics today, Rocephin and Zithromax.  Additionally, you are prescribed metronidazole; take this twice a day for 7 days.    Do not have sex for 7 days.  Your STD tests are pending.  If your test results are positive, we will call you.  You may need additional treatment and your partner may also need treatment.

## 2018-12-07 MED FILL — ATORVASTATIN 20 MG TABLET: 20 | 30 days supply | Qty: 30 | Fill #1

## 2018-12-07 MED FILL — AMLODIPINE BESYLATE 2.5 MG: 2.5 | 30 days supply | Qty: 30 | Fill #1

## 2018-12-08 LAB — CERVICOVAGINAL ANCILLARY ONLY
Bacterial vaginitis: POSITIVE — AB
Candida vaginitis: POSITIVE — AB
Chlamydia: NEGATIVE
Neisseria Gonorrhea: NEGATIVE
Trichomonas: NEGATIVE

## 2018-12-09 ENCOUNTER — Telehealth (HOSPITAL_COMMUNITY): Payer: Self-pay | Admitting: Emergency Medicine

## 2018-12-09 MED ORDER — FLUCONAZOLE 150 MG PO TABS: 150.0000 mg | ORAL_TABLET | Freq: Once | ORAL | 0 refills | Status: AC

## 2018-12-09 NOTE — Telephone Encounter (Signed)
Bacterial Vaginosis test is positive.  Prescription for metronidazole was given at the urgent care visit.  Test for candida (yeast) was positive.  Prescription for fluconazole 150mg  po now, repeat dose in 3d if needed, #2 no refills, sent to the pharmacy of record.  Recheck or followup with PCP for further evaluation if symptoms are not improving.     Patient contacted and made aware of    results, all questions answered

## 2018-12-16 ENCOUNTER — Encounter: Payer: Self-pay | Admitting: Gynecology

## 2019-01-25 ENCOUNTER — Other Ambulatory Visit: Payer: Self-pay | Admitting: Internal Medicine

## 2019-01-25 DIAGNOSIS — E782 Mixed hyperlipidemia: Secondary | ICD-10-CM

## 2019-01-25 DIAGNOSIS — I10 Essential (primary) hypertension: Secondary | ICD-10-CM

## 2019-02-01 ENCOUNTER — Telehealth: Payer: Self-pay | Admitting: Nurse Practitioner

## 2019-02-01 DIAGNOSIS — I10 Essential (primary) hypertension: Secondary | ICD-10-CM

## 2019-02-01 DIAGNOSIS — E782 Mixed hyperlipidemia: Secondary | ICD-10-CM

## 2019-02-01 DIAGNOSIS — E611 Iron deficiency: Secondary | ICD-10-CM

## 2019-02-01 MED ORDER — FERROUS SULFATE 325 (65 FE) MG PO TABS: 325.0000 mg | ORAL_TABLET | Freq: Two times a day (BID) | ORAL | 0 refills | Status: DC

## 2019-02-01 MED ORDER — AMLODIPINE BESYLATE 2.5 MG PO TABS: 2.5000 mg | ORAL_TABLET | Freq: Every day | ORAL | 0 refills | Status: DC

## 2019-02-01 MED ORDER — ATORVASTATIN CALCIUM 20 MG PO TABS: 20.0000 mg | ORAL_TABLET | Freq: Every day | ORAL | 0 refills | Status: DC

## 2019-02-01 MED FILL — AMLODIPINE BESYLATE 2.5 MG: 2.5 | 30 days supply | Qty: 30 | Fill #0

## 2019-02-01 MED FILL — ATORVASTATIN CALCIUM 20 MG: 20 | 30 days supply | Qty: 30 | Fill #0

## 2019-02-01 MED FILL — FERROUS SULFATE 325 MG TAB: 325 (65 FE) | 30 days supply | Qty: 60 | Fill #0

## 2019-02-01 NOTE — Telephone Encounter (Signed)
Refills provided 

## 2019-02-01 NOTE — Telephone Encounter (Signed)
Patient needs meds to last until the dec.

## 2019-02-25 ENCOUNTER — Ambulatory Visit: Payer: Self-pay | Attending: Nurse Practitioner

## 2019-02-25 ENCOUNTER — Other Ambulatory Visit: Payer: Self-pay

## 2019-02-25 DIAGNOSIS — I10 Essential (primary) hypertension: Secondary | ICD-10-CM

## 2019-02-25 DIAGNOSIS — N898 Other specified noninflammatory disorders of vagina: Secondary | ICD-10-CM

## 2019-02-26 ENCOUNTER — Ambulatory Visit: Payer: Self-pay | Attending: Nurse Practitioner | Admitting: Nurse Practitioner

## 2019-02-26 ENCOUNTER — Encounter: Payer: Self-pay | Admitting: Nurse Practitioner

## 2019-02-26 DIAGNOSIS — I10 Essential (primary) hypertension: Secondary | ICD-10-CM

## 2019-02-26 DIAGNOSIS — E611 Iron deficiency: Secondary | ICD-10-CM

## 2019-02-26 DIAGNOSIS — E559 Vitamin D deficiency, unspecified: Secondary | ICD-10-CM

## 2019-02-26 DIAGNOSIS — E782 Mixed hyperlipidemia: Secondary | ICD-10-CM

## 2019-02-26 LAB — LIPID PANEL
Chol/HDL Ratio: 3.7 ratio (ref 0.0–4.4)
Cholesterol, Total: 166 mg/dL (ref 100–199)
HDL: 45 mg/dL (ref >39)
LDL Chol Calc (NIH): 100 mg/dL — ABNORMAL HIGH (ref 0–99)
Triglycerides: 118 mg/dL (ref 0–149)
VLDL Cholesterol Cal: 21 mg/dL (ref 5–40)

## 2019-02-26 LAB — BASIC METABOLIC PANEL
BUN/Creatinine Ratio: 10 (ref 9–23)
BUN: 9 mg/dL (ref 6–24)
CO2: 24 mmol/L (ref 20–29)
Calcium: 9.3 mg/dL (ref 8.7–10.2)
Chloride: 102 mmol/L (ref 96–106)
Creatinine, Ser: 0.88 mg/dL (ref 0.57–1.00)
GFR calc Af Amer: 88 mL/min/{1.73_m2} (ref >59)
GFR calc non Af Amer: 76 mL/min/{1.73_m2} (ref >59)
Glucose: 133 mg/dL — ABNORMAL HIGH (ref 65–99)
Potassium: 4.3 mmol/L (ref 3.5–5.2)
Sodium: 140 mmol/L (ref 134–144)

## 2019-02-26 LAB — VITAMIN D 25 HYDROXY (VIT D DEFICIENCY, FRACTURES): Vit D, 25-Hydroxy: 11.4 ng/mL — ABNORMAL LOW (ref 30.0–100.0)

## 2019-02-26 LAB — HEMOGLOBIN A1C
Est. average glucose Bld gHb Est-mCnc: 143 mg/dL
Hgb A1c MFr Bld: 6.6 % — ABNORMAL HIGH (ref 4.8–5.6)

## 2019-02-26 LAB — CBC
Hematocrit: 39 % (ref 34.0–46.6)
Hemoglobin: 11.9 g/dL (ref 11.1–15.9)
MCH: 23 pg — ABNORMAL LOW (ref 26.6–33.0)
MCHC: 30.5 g/dL — ABNORMAL LOW (ref 31.5–35.7)
MCV: 75 fL — ABNORMAL LOW (ref 79–97)
Platelets: 304 10*3/uL (ref 150–450)
RBC: 5.18 x10E6/uL (ref 3.77–5.28)
RDW: 15.5 % — ABNORMAL HIGH (ref 11.7–15.4)
WBC: 4.7 10*3/uL (ref 3.4–10.8)

## 2019-02-26 MED ORDER — FERROUS SULFATE 325 (65 FE) MG PO TABS: 325.0000 mg | ORAL_TABLET | Freq: Two times a day (BID) | ORAL | 6 refills | Status: DC

## 2019-02-26 MED ORDER — AMLODIPINE BESYLATE 2.5 MG PO TABS: 2.5000 mg | ORAL_TABLET | Freq: Every day | ORAL | 3 refills | Status: DC

## 2019-02-26 MED ORDER — ATORVASTATIN CALCIUM 20 MG PO TABS: 20.0000 mg | ORAL_TABLET | Freq: Every day | ORAL | 2 refills | Status: DC

## 2019-02-26 MED ORDER — VITAMIN D (ERGOCALCIFEROL) 1.25 MG (50000 UNIT) PO CAPS: 50000.0000 [IU] | ORAL_CAPSULE | ORAL | 0 refills | Status: DC

## 2019-02-26 NOTE — Progress Notes (Signed)
Virtual Visit via Telephone Note Due to national recommendations of social distancing due to [place] 19, telehealth visit is felt to be most appropriate for this patient at this time.  I discussed the limitations, risks, security and privacy concerns of performing an evaluation and management service by telephone and the availability of in person appointments. I also discussed with the patient that there may be a patient responsible charge related to this service. The patient expressed understanding and agreed to proceed.    I connected with Amanda Tran on 02/26/19  at   9:10 AM EST  EDT by telephone and verified that I am speaking with the correct person using two identifiers.   Consent I discussed the limitations, risks, security and privacy concerns of performing an evaluation and management service by telephone and the availability of in person appointments. I also discussed with the patient that there may be a patient responsible charge related to this service. The patient expressed understanding and agreed to proceed.   Location of Patient: Private Residence   Location of Provider: [place] and [place] participating in Telemedicine visit: [name] FNP-BC [place]    History of Present Illness: Telemedicine visit for: Follow Up We discussed all of her lab results in detail today. All questions answered and she verbalized understanding  Referred to Breast clinic for mammogram FIT test at next office visit.   DM TYPE 2 She was previously in the prediabetic range and now a1c indicates diabetes. She wants to work on losing weight and modifying her diet over the next 3 months prior to starting medication (metformin). She does note unhealthy dietary intake over the past several months due to stress and pandemic.  Lab Results  Component Value Date   HGBA1C 6.6 (H) 02/25/2019   Essential Hypertension She has a  blood pressure monitor but has not monitored her blood pressure in several weeks. Denies chest pain, shortness of breath, palpitations, lightheadedness, dizziness, headaches or BLE edema. Taking amlodipine 2.5 mg daily as prescribed. I have requested that she send me readings of her blood pressure for the next week via mychart.  BP Readings from Last 3 Encounters:  12/04/18 (!) 136/99  12/04/18 (!) 144/98  10/06/18 103/71     Past Medical History:  Diagnosis Date  . DVT (deep venous thrombosis) ([place]) 03/11/2012   Left popliteal   . Hyperlipidemia   . Hypertension     Past Surgical History:  Procedure Laterality Date  . [place], [id number], 1996  . TUBAL LIGATION      Family History  Problem Relation Age of Onset  . Hypertension Father   . Diabetes Father   . Kidney failure Mother     Social History   Socioeconomic History  . Marital status: Divorced    Spouse name: Not on file  . Number of children: Not on file  . Years of education: Not on file  . Highest education level: Not on file  Occupational History  . Not on file  Social Needs  . Financial resource strain: Not on file  . Food insecurity    Worry: Not on file    Inability: Not on file  . Transportation needs    Medical: Not on file    Non-medical: Not on file  Tobacco Use  . Smoking status: Former Smoker    Packs/day: 0.50    Years: 25.00    Pack  years: 12.50    Types: Cigarettes  . Smokeless tobacco: Never Used  Substance and Sexual Activity  . Alcohol use: Yes    Comment: occasional  . Drug use: No  . Sexual activity: Yes    Birth control/protection: Condom, Surgical    Comment: 1st intercourse 53 yo-More than 5 partners-BTL  Lifestyle  . Physical activity    Days per week: Not on file    Minutes per session: Not on file  . Stress: Not on file  Relationships  . Social Herbalist on phone: Not on file    Gets together: Not on file    Attends religious service: Not  on file    Active member of club or organization: Not on file    Attends meetings of clubs or organizations: Not on file    Relationship status: Not on file  Other Topics Concern  . Not on file  Social History Narrative  . Not on file     Observations/Objective: Awake, alert and oriented x 3   Review of Systems  Constitutional: Negative for fever, malaise/fatigue and weight loss.  HENT: Negative.  Negative for nosebleeds.   Eyes: Negative.  Negative for blurred vision, double vision and photophobia.  Respiratory: Negative.  Negative for cough and shortness of breath.   Cardiovascular: Negative.  Negative for chest pain, palpitations and leg swelling.  Gastrointestinal: Negative.  Negative for heartburn, nausea and vomiting.  Musculoskeletal: Negative.  Negative for myalgias.  Neurological: Negative.  Negative for dizziness, focal weakness, seizures and headaches.  Psychiatric/Behavioral: Negative.  Negative for suicidal ideas.    Assessment and Plan: [place] was seen today for medication refill.  Diagnoses and all orders for this visit:  Essential hypertension -     amLODipine (NORVASC) 2.5 MG tablet; Take 1 tablet (2.5 mg total) by mouth daily. Continue all antihypertensives as prescribed.  Remember to bring in your blood pressure log with you for your follow up appointment.  DASH/Mediterranean Diets are healthier choices for HTN.   Mixed hyperlipidemia -     atorvastatin (LIPITOR) 20 MG tablet; Take 1 tablet (20 mg total) by mouth daily. INSTRUCTIONS: Work on a low fat, heart healthy diet and participate in regular aerobic exercise program by working out at least 150 minutes per week; 5 days a week-30 minutes per day. Avoid red meat/beef/steak,  fried foods. junk foods, sodas, sugary drinks, unhealthy snacking, alcohol and smoking.  Drink at least 80 oz of water per day and monitor your carbohydrate intake daily.   Iron deficiency -     ferrous sulfate 325 (65 FE) MG  tablet; Take 1 tablet (325 mg total) by mouth 2 (two) times daily with a meal. She had stopped taking iron. Menstrual cycles have not been as heavy over the past few months. Instructed her that levels are low normal and she needs to resume iron.   Vitamin D deficiency -     Vitamin D, Ergocalciferol, (DRISDOL) 1.25 MG (50000 UT) CAPS capsule; Take 1 capsule (50,000 Units total) by mouth every 7 (seven) days. 90 day supply     Follow Up Instructions Return in about 3 months (around 05/27/2019).     I discussed the assessment and treatment plan with the patient. The patient was provided an opportunity to ask questions and all were answered. The patient agreed with the plan and demonstrated an understanding of the instructions.   The patient was advised to call back or seek an in-person evaluation if  the symptoms worsen or if the condition fails to improve as anticipated.  I provided 21 minutes of non-face-to-face time during this encounter including median intraservice time, reviewing previous notes, labs, imaging, medications and explaining diagnosis and management.  [name], FNP-BC

## 2019-03-01 LAB — CERVICOVAGINAL ANCILLARY ONLY
Bacterial Vaginitis (gardnerella): POSITIVE — AB
Candida Glabrata: POSITIVE — AB
Candida Vaginitis: NEGATIVE
Chlamydia: NEGATIVE
Comment: NEGATIVE
Comment: NEGATIVE
Comment: NEGATIVE
Comment: NEGATIVE
Comment: NEGATIVE
Comment: NORMAL
Neisseria Gonorrhea: NEGATIVE
Trichomonas: NEGATIVE

## 2019-03-02 ENCOUNTER — Other Ambulatory Visit: Payer: Self-pay | Admitting: Nurse Practitioner

## 2019-03-02 MED ORDER — FLUCONAZOLE 150 MG PO TABS: 150.0000 mg | ORAL_TABLET | Freq: Once | ORAL | 1 refills | Status: AC

## 2019-03-02 MED ORDER — METRONIDAZOLE 500 MG PO TABS: 500.0000 mg | ORAL_TABLET | Freq: Two times a day (BID) | ORAL | 0 refills | Status: DC

## 2019-03-03 MED FILL — FLUCONAZOLE 150 MG TABLET: 150 | 1 days supply | Qty: 1 | Fill #0

## 2019-03-03 MED FILL — metroNIDAZOLE 500 MG TABS: 500 | 7 days supply | Qty: 14 | Fill #0

## 2019-03-04 ENCOUNTER — Other Ambulatory Visit: Payer: Self-pay | Admitting: Nurse Practitioner

## 2019-03-04 MED ORDER — METRONIDAZOLE 0.75 % VA GEL: 1.0000 | Freq: Two times a day (BID) | VAGINAL | 0 refills | Status: AC

## 2019-03-04 MED FILL — metroNIDAZOLE 0.75 % GEL: 0.75 | 5 days supply | Qty: 70 | Fill #0

## 2019-03-08 ENCOUNTER — Other Ambulatory Visit: Payer: Self-pay | Admitting: Nurse Practitioner

## 2019-03-08 DIAGNOSIS — I10 Essential (primary) hypertension: Secondary | ICD-10-CM

## 2019-03-08 MED ORDER — AMLODIPINE BESYLATE 5 MG PO TABS: 5.0000 mg | ORAL_TABLET | Freq: Every day | ORAL | 0 refills | Status: DC

## 2019-03-08 MED FILL — AMLODIPINE BESYLATE 5 MG TA: 5 | 30 days supply | Qty: 30 | Fill #0

## 2019-03-11 ENCOUNTER — Telehealth (HOSPITAL_COMMUNITY): Payer: Self-pay

## 2019-03-11 NOTE — Telephone Encounter (Signed)
Telephoned patient at home number. Mailbox full, unable to leave a message from [facility].

## 2019-03-12 ENCOUNTER — Other Ambulatory Visit (HOSPITAL_COMMUNITY): Payer: Self-pay | Admitting: *Deleted

## 2019-03-12 DIAGNOSIS — Z1231 Encounter for screening mammogram for malignant neoplasm of breast: Secondary | ICD-10-CM

## 2019-03-15 ENCOUNTER — Other Ambulatory Visit: Payer: Self-pay

## 2019-03-15 ENCOUNTER — Ambulatory Visit: Payer: Self-pay | Attending: Nurse Practitioner | Admitting: *Deleted

## 2019-03-15 DIAGNOSIS — Z111 Encounter for screening for respiratory tuberculosis: Secondary | ICD-10-CM

## 2019-03-15 NOTE — Progress Notes (Signed)
Patient tolerated injection well today in the left arm. Patient denies any cold Sx at this time.

## 2019-03-15 NOTE — Patient Instructions (Signed)
Patient is aware of scheduling a PPD reading in 48-72 hours.

## 2019-03-17 ENCOUNTER — Ambulatory Visit: Payer: Self-pay | Attending: Nurse Practitioner | Admitting: *Deleted

## 2019-03-17 ENCOUNTER — Other Ambulatory Visit: Payer: Self-pay

## 2019-03-17 DIAGNOSIS — Z111 Encounter for screening for respiratory tuberculosis: Secondary | ICD-10-CM

## 2019-03-17 LAB — TB SKIN TEST
Induration: 0 mm
TB Skin Test: NEGATIVE

## 2019-04-01 ENCOUNTER — Other Ambulatory Visit: Payer: Self-pay | Admitting: Family Medicine

## 2019-04-01 DIAGNOSIS — E782 Mixed hyperlipidemia: Secondary | ICD-10-CM

## 2019-04-01 MED FILL — AMLODIPINE BESYLATE 5 MG TA: 5 | 30 days supply | Qty: 30 | Fill #0

## 2019-04-20 ENCOUNTER — Encounter: Payer: Self-pay | Admitting: Nurse Practitioner

## 2019-04-20 ENCOUNTER — Ambulatory Visit: Payer: Self-pay | Attending: Nurse Practitioner | Admitting: Nurse Practitioner

## 2019-04-20 DIAGNOSIS — E559 Vitamin D deficiency, unspecified: Secondary | ICD-10-CM

## 2019-04-20 DIAGNOSIS — R233 Spontaneous ecchymoses: Secondary | ICD-10-CM

## 2019-04-20 DIAGNOSIS — E611 Iron deficiency: Secondary | ICD-10-CM

## 2019-04-20 DIAGNOSIS — I10 Essential (primary) hypertension: Secondary | ICD-10-CM

## 2019-04-20 DIAGNOSIS — E782 Mixed hyperlipidemia: Secondary | ICD-10-CM

## 2019-04-20 MED ORDER — AMLODIPINE BESYLATE 10 MG PO TABS: 10.0000 mg | ORAL_TABLET | Freq: Every day | ORAL | 0 refills | Status: DC

## 2019-04-20 MED ORDER — VITAMIN D (ERGOCALCIFEROL) 1.25 MG (50000 UNIT) PO CAPS: 50000.0000 [IU] | ORAL_CAPSULE | ORAL | 0 refills | Status: DC

## 2019-04-20 MED ORDER — ATORVASTATIN CALCIUM 20 MG PO TABS: 20.0000 mg | ORAL_TABLET | Freq: Every day | ORAL | 2 refills | Status: DC

## 2019-04-20 MED ORDER — FERROUS SULFATE 325 (65 FE) MG PO TABS: 325.0000 mg | ORAL_TABLET | Freq: Two times a day (BID) | ORAL | 6 refills | Status: DC

## 2019-04-20 MED FILL — ATORVASTATIN CALCIUM 20 MG: 20 | 30 days supply | Qty: 30 | Fill #0

## 2019-04-20 MED FILL — AMLODIPINE BESYLATE 10 MG T: 10 | 30 days supply | Qty: 30 | Fill #0

## 2019-04-20 MED FILL — VIT D2 1.25 MG (50,000 UNIT: 1.25 MG | 84 days supply | Qty: 12 | Fill #0

## 2019-04-20 NOTE — Progress Notes (Signed)
Virtual Visit via Telephone Note Due to national recommendations of social distancing due to [place] 19, telehealth visit is felt to be most appropriate for this patient at this time.  I discussed the limitations, risks, security and privacy concerns of performing an evaluation and management service by telephone and the availability of in person appointments. I also discussed with the patient that there may be a patient responsible charge related to this service. The patient expressed understanding and agreed to proceed.    I connected with Amanda Tran on 04/20/19  at   2:10 PM EST  EDT by telephone and verified that I am speaking with the correct person using two identifiers.   Consent I discussed the limitations, risks, security and privacy concerns of performing an evaluation and management service by telephone and the availability of in person appointments. I also discussed with the patient that there may be a patient responsible charge related to this service. The patient expressed understanding and agreed to proceed.   Location of Patient: Private  Residence   Location of Provider: [place] and [place] participating in Telemedicine visit: [name] FNP-BC [place]    History of Present Illness: Telemedicine visit for: Bruises  Bruising Onset 1 week ago. Occurring only on the left leg. She denies any injury or trauma. Can't recall hitting her leg up against anything that caused pain. She does not have a history of bleeding disorder. Denies excessive use of any NSAIDs or ASA. Denies chest pain, shortness of breath, palpitations, lightheadedness, dizziness, headaches or BLE edema. She does have a history of IDA for which she takes ferrous sulfate as prescribed.    Past Medical History:  Diagnosis Date  . DVT (deep venous thrombosis) ([place]) 03/11/2012   Left popliteal   . Hyperlipidemia   . Hypertension      Past Surgical History:  Procedure Laterality Date  . [place], [id number], 1996  . TUBAL LIGATION      Family History  Problem Relation Age of Onset  . Hypertension Father   . Diabetes Father   . Kidney failure Mother     Social History   Socioeconomic History  . Marital status: Divorced    Spouse name: Not on file  . Number of children: Not on file  . Years of education: Not on file  . Highest education level: Not on file  Occupational History  . Not on file  Tobacco Use  . Smoking status: Former Smoker    Packs/day: 0.50    Years: 25.00    Pack years: 12.50    Types: Cigarettes  . Smokeless tobacco: Never Used  Substance and Sexual Activity  . Alcohol use: Yes    Comment: occasional  . Drug use: No  . Sexual activity: Yes    Birth control/protection: Condom, Surgical    Comment: 1st intercourse 54 yo-More than 5 partners-BTL  Other Topics Concern  . Not on file  Social History Narrative  . Not on file   Social Determinants of Health   Financial Resource Strain:   . Difficulty of Paying Living Expenses: Not on file  Food Insecurity:   . Worried About Charity fundraiser in the Last Year: Not on file  . Ran Out of Food in the Last Year: Not on file  Transportation Needs:   . Lack of Transportation (Medical): Not on file  . Lack of Transportation (Non-Medical):  Not on file  Physical Activity:   . Days of Exercise per Week: Not on file  . Minutes of Exercise per Session: Not on file  Stress:   . Feeling of Stress : Not on file  Social Connections:   . Frequency of Communication with Friends and Family: Not on file  . Frequency of Social Gatherings with Friends and Family: Not on file  . Attends Religious Services: Not on file  . Active Member of Clubs or Organizations: Not on file  . Attends Archivist Meetings: Not on file  . Marital Status: Not on file     Observations/Objective: Awake, alert and oriented x 3   Review of  Systems  Constitutional: Negative for fever, malaise/fatigue and weight loss.  HENT: Negative.  Negative for nosebleeds.   Eyes: Negative.  Negative for blurred vision, double vision and photophobia.  Respiratory: Negative.  Negative for cough and shortness of breath.   Cardiovascular: Negative.  Negative for chest pain, palpitations and leg swelling.  Gastrointestinal: Negative.  Negative for heartburn, nausea and vomiting.  Musculoskeletal: Negative.  Negative for myalgias.  Skin:       SEE HPI  Neurological: Negative.  Negative for dizziness, focal weakness, seizures and headaches.  Psychiatric/Behavioral: Negative.  Negative for suicidal ideas.    Assessment and Plan: Diagnoses and all orders for this visit:  Bruising, spontaneous -     CBC -     Protime-INR  Mixed hyperlipidemia -     atorvastatin (LIPITOR) 20 MG tablet; Take 1 tablet (20 mg total) by mouth daily. INSTRUCTIONS: Work on a low fat, heart healthy diet and participate in regular aerobic exercise program by working out at least 150 minutes per week; 5 days a week-30 minutes per day. Avoid red meat/beef/steak,  fried foods. junk foods, sodas, sugary drinks, unhealthy snacking, alcohol and smoking.  Drink at least 80 oz of water per day and monitor your carbohydrate intake daily.    Essential hypertension -     amLODipine (NORVASC) 10 MG tablet; Take 1 tablet (10 mg total) by mouth daily. Continue all antihypertensives as prescribed.  Remember to bring in your blood pressure log with you for your follow up appointment.  DASH/Mediterranean Diets are healthier choices for HTN.    Iron deficiency -     ferrous sulfate 325 (65 FE) MG tablet; Take 1 tablet (325 mg total) by mouth 2 (two) times daily with a meal.  Vitamin D deficiency -     Vitamin D, Ergocalciferol, (DRISDOL) 1.25 MG (50000 UNIT) CAPS capsule; Take 1 capsule (50,000 Units total) by mouth every 7 (seven) days. 90 day supply     Follow Up  Instructions Return in about 3 months (around 07/19/2019).     I discussed the assessment and treatment plan with the patient. The patient was provided an opportunity to ask questions and all were answered. The patient agreed with the plan and demonstrated an understanding of the instructions.   The patient was advised to call back or seek an in-person evaluation if the symptoms worsen or if the condition fails to improve as anticipated.  I provided 14 minutes of non-face-to-face time during this encounter including median intraservice time, reviewing previous notes, labs, imaging, medications and explaining diagnosis and management.  [name], FNP-BC

## 2019-04-21 ENCOUNTER — Other Ambulatory Visit: Payer: Self-pay

## 2019-04-21 ENCOUNTER — Ambulatory Visit: Payer: Self-pay | Attending: Nurse Practitioner

## 2019-04-22 LAB — CBC
Hematocrit: 41.7 % (ref 34.0–46.6)
Hemoglobin: 13.1 g/dL (ref 11.1–15.9)
MCH: 25 pg — ABNORMAL LOW (ref 26.6–33.0)
MCHC: 31.4 g/dL — ABNORMAL LOW (ref 31.5–35.7)
MCV: 80 fL (ref 79–97)
Platelets: 315 10*3/uL (ref 150–450)
RBC: 5.24 x10E6/uL (ref 3.77–5.28)
RDW: 19.3 % — ABNORMAL HIGH (ref 11.7–15.4)
WBC: 4.7 10*3/uL (ref 3.4–10.8)

## 2019-04-22 LAB — PROTIME-INR
INR: 1 (ref 0.9–1.2)
Prothrombin Time: 10.3 s (ref 9.1–12.0)

## 2019-04-26 ENCOUNTER — Telehealth (INDEPENDENT_AMBULATORY_CARE_PROVIDER_SITE_OTHER): Payer: Self-pay | Admitting: Family Medicine

## 2019-04-26 ENCOUNTER — Encounter: Payer: Self-pay | Admitting: Family Medicine

## 2019-04-26 DIAGNOSIS — N840 Polyp of corpus uteri: Secondary | ICD-10-CM

## 2019-04-26 DIAGNOSIS — N920 Excessive and frequent menstruation with regular cycle: Secondary | ICD-10-CM

## 2019-04-26 DIAGNOSIS — D500 Iron deficiency anemia secondary to blood loss (chronic): Secondary | ICD-10-CM

## 2019-04-26 NOTE — Progress Notes (Signed)
I connected with  Amanda Tran on 04/26/19 at 1550 by MyChart and verified that I am speaking with the correct person using two identifiers.   I discussed the limitations, risks, security and privacy concerns of performing an evaluation and management service by telephone and the availability of in person appointments. I also discussed with the patient that there may be a patient responsible charge related to this service. The patient expressed understanding and agreed to proceed.  Annabell Howells, RN 04/26/2019  3:47 PM

## 2019-04-26 NOTE — Progress Notes (Signed)
TELEHEALTH GYNECOLOGY VIRTUAL VIDEO VISIT ENCOUNTER NOTE  Provider location: [facility] for [facility] at [place]   I connected with [name] on 04/26/19 at  3:55 PM EST by MyChart Video Encounter at home and verified that I am speaking with the correct person using two identifiers.   I discussed the limitations, risks, security and privacy concerns of performing an evaluation and management service virtually and the availability of in person appointments. I also discussed with the patient that there may be a patient responsible charge related to this service. The patient expressed understanding and agreed to proceed.   History:  Amanda Tran is a 54 y.o. G33P0 female being evaluated today for  Patient seen for f/u of menorrhagia. Occasionally has heavy cycle, but fairly well controlled. Still having menses every 30 days or so. Is taking iron. Had CBC last week: Hg 13. . She denies any abnormal vaginal discharge, bleeding, pelvic pain or other concerns.       Past Medical History:  Diagnosis Date  . DVT (deep venous thrombosis) ([place]) 03/11/2012   Left popliteal   . Hyperlipidemia   . Hypertension    Past Surgical History:  Procedure Laterality Date  . [place], [id number], 1996  . TUBAL LIGATION     The following portions of the patient's history were reviewed and updated as appropriate: allergies, current medications, past family history, past medical history, past social history, past surgical history and problem list.   Health Maintenance:  Normal pap and negative HRHPV on 03/2018. Has mammogram next week.   Review of Systems:  Pertinent items noted in HPI and remainder of comprehensive ROS otherwise negative.  Physical Exam:   General:  Alert, oriented and cooperative. Patient appears to be in no acute distress.  Mental Status: Normal mood and affect. Normal behavior. Normal judgment and thought content.   Respiratory: Normal  respiratory effort, no problems with respiration noted  Rest of physical exam deferred due to type of encounter  Labs and Imaging Results for orders placed or performed in visit on 04/20/19 (from the past 336 hour(s))  CBC   Collection Time: 04/21/19  1:55 PM  Result Value Ref Range   WBC 4.7 3.4 - 10.8 x10E3/uL   RBC 5.24 3.77 - 5.28 x10E6/uL   Hemoglobin 13.1 11.1 - 15.9 g/dL   Hematocrit 41.7 34.0 - 46.6 %   MCV 80 79 - 97 fL   MCH 25.0 (L) 26.6 - 33.0 pg   MCHC 31.4 (L) 31.5 - 35.7 g/dL   RDW 19.3 (H) 11.7 - 15.4 %   Platelets 315 150 - 450 x10E3/uL  Protime-INR   Collection Time: 04/21/19  1:55 PM  Result Value Ref Range   INR 1.0 0.9 - 1.2   Prothrombin Time 10.3 9.1 - 12.0 sec   No results found.     Assessment and Plan:     1. Menorrhagia with regular cycle Controlled  2. Endometrial polyp  3. Iron deficiency anemia due to chronic blood loss Controlled. Reduce iron to once a day.       I discussed the assessment and treatment plan with the patient. The patient was provided an opportunity to ask questions and all were answered. The patient agreed with the plan and demonstrated an understanding of the instructions.   The patient was advised to call back or seek an in-person evaluation/go to the ED if the symptoms worsen or if the condition fails to  improve as anticipated.  I provided 10 minutes of face-to-face time during this encounter.   [place] for [facility], [place]

## 2019-05-04 ENCOUNTER — Encounter (HOSPITAL_COMMUNITY): Payer: Self-pay

## 2019-05-04 ENCOUNTER — Ambulatory Visit
Admission: RE | Admit: 2019-05-04 | Discharge: 2019-05-04 | Disposition: A | Discharge status: Home / Self Care | Payer: Self-pay | Source: Ambulatory Visit | Attending: Obstetrics and Gynecology | Admitting: Obstetrics and Gynecology

## 2019-05-04 ENCOUNTER — Other Ambulatory Visit: Payer: Self-pay

## 2019-05-04 ENCOUNTER — Ambulatory Visit (HOSPITAL_COMMUNITY)
Admission: RE | Admit: 2019-05-04 | Discharge: 2019-05-04 | Disposition: A | Discharge status: Home / Self Care | Payer: Self-pay | Source: Ambulatory Visit | Attending: Obstetrics and Gynecology | Admitting: Obstetrics and Gynecology

## 2019-05-04 DIAGNOSIS — Z1239 Encounter for other screening for malignant neoplasm of breast: Secondary | ICD-10-CM

## 2019-05-04 DIAGNOSIS — Z1231 Encounter for screening mammogram for malignant neoplasm of breast: Secondary | ICD-10-CM

## 2019-05-04 NOTE — Patient Instructions (Signed)
Explained breast self awareness with [name]. Patient did not need a Pap smear today due to last Pap smear and HPV typing was 03/31/2018. Let her know BCCCP will cover Pap smears and HPV typing every 5 years unless has a history of abnormal Pap smears. Referred patient to the [facility] for a screening mammogram. Appointment scheduled for Tuesday, May 04, 2019 at 1540. Patient aware of appointment and will be there. Let patient know the [facility] will follow up with her within the next couple weeks with results of mammogram by letter or phone. [name] verbalized understanding.  [name], Arvil Chaco, RN 2:55 PM

## 2019-05-04 NOTE — Progress Notes (Signed)
No complaints today.   Pap Smear: Pap smear not completed today. Last Pap smear was 03/31/2018 at [facility] and normal with negative HPV and showed BV. Per patient has a history of an abnormal Pap smear in 1990 that she thinks a colposcopy was completed for follow-up. Per patient all Pap smears have been normal since her follow-up and has had at least three normal Pap smears since. Last two Pap smear results are in Epic.  Physical exam: Breasts Breasts symmetrical. No skin abnormalities bilateral breasts. No nipple retraction bilateral breasts. No nipple discharge bilateral breasts. No lymphadenopathy. No lumps palpated bilateral breasts. No complaints of pain or tenderness on exam. Referred patient to the [facility] for a screening mammogram. Appointment scheduled for Tuesday, May 04, 2019 at 1540.        Pelvic/Bimanual No Pap smear completed today since last Pap smear and HPV typing was 03/31/2018. Pap smear not indicated per BCCCP guidelines.   Smoking History: Patient is a former smoker that quit over a year ago.  Patient Navigation: Patient education provided. Access to services provided for patient through [place] program.   Colorectal Cancer Screening: Per patient has never had a colonoscopy completed. Patient completed a FIT Test 12/14/2017 that was negative. No complaints today.   Breast and Cervical Cancer Risk Assessment: Patient has no family history of breast cancer, known genetic mutations, or radiation treatment to the chest before age 59. Per patient has a history of cervical dysplasia. Patient has no history of being immunocompromised or DES exposure in-utero.  Risk Assessment    Risk Scores      05/04/2019 02/26/2018   Last edited by: Demetrius Revel, LPN Tommaso Cavitt, Heath Gold, RN   5-year risk: 0.9 % 0.9 %   Lifetime risk: 6.6 % 6.7 %

## 2019-05-28 ENCOUNTER — Ambulatory Visit: Payer: Self-pay | Admitting: Nurse Practitioner

## 2019-05-31 MED FILL — FERROUS SULFATE 325 MG TAB: 325 (65 FE) | 30 days supply | Qty: 60 | Fill #0

## 2019-05-31 MED FILL — AMLODIPINE BESYLATE 10 MG T: 10 | 30 days supply | Qty: 30 | Fill #1

## 2019-05-31 MED FILL — ATORVASTATIN CALCIUM 20 MG: 20 | 30 days supply | Qty: 30 | Fill #1

## 2019-08-09 ENCOUNTER — Other Ambulatory Visit: Payer: Self-pay

## 2019-08-09 ENCOUNTER — Encounter: Payer: Self-pay | Admitting: Nurse Practitioner

## 2019-08-09 ENCOUNTER — Ambulatory Visit: Payer: Self-pay | Attending: Nurse Practitioner | Admitting: Nurse Practitioner

## 2019-08-09 VITALS — BP 128/85 | HR 93 | Temp 97.7°F | Ht 62.0 in | Wt 204.0 lb

## 2019-08-09 DIAGNOSIS — Z1211 Encounter for screening for malignant neoplasm of colon: Secondary | ICD-10-CM

## 2019-08-09 DIAGNOSIS — D509 Iron deficiency anemia, unspecified: Secondary | ICD-10-CM

## 2019-08-09 DIAGNOSIS — E782 Mixed hyperlipidemia: Secondary | ICD-10-CM

## 2019-08-09 DIAGNOSIS — F5105 Insomnia due to other mental disorder: Secondary | ICD-10-CM

## 2019-08-09 DIAGNOSIS — E1165 Type 2 diabetes mellitus with hyperglycemia: Secondary | ICD-10-CM

## 2019-08-09 DIAGNOSIS — I10 Essential (primary) hypertension: Secondary | ICD-10-CM

## 2019-08-09 DIAGNOSIS — E559 Vitamin D deficiency, unspecified: Secondary | ICD-10-CM

## 2019-08-09 DIAGNOSIS — F418 Other specified anxiety disorders: Secondary | ICD-10-CM

## 2019-08-09 DIAGNOSIS — R7303 Prediabetes: Secondary | ICD-10-CM

## 2019-08-09 LAB — POCT GLYCOSYLATED HEMOGLOBIN (HGB A1C): Hemoglobin A1C: 7 % — AB (ref 4.0–5.6)

## 2019-08-09 LAB — GLUCOSE, POCT (MANUAL RESULT ENTRY): POC Glucose: 192 mg/dl — AB (ref 70–99)

## 2019-08-09 MED ORDER — TRUE METRIX METER W/DEVICE KIT: PACK | 0 refills | Status: DC

## 2019-08-09 MED ORDER — FERROUS SULFATE 325 (65 FE) MG PO TABS: 325.0000 mg | ORAL_TABLET | Freq: Two times a day (BID) | ORAL | 6 refills | Status: DC

## 2019-08-09 MED ORDER — ESCITALOPRAM OXALATE 5 MG PO TABS: 5.0000 mg | ORAL_TABLET | Freq: Every day | ORAL | 2 refills | Status: DC

## 2019-08-09 MED ORDER — GLIMEPIRIDE 2 MG PO TABS: 2.0000 mg | ORAL_TABLET | Freq: Every day | ORAL | 3 refills | Status: DC

## 2019-08-09 MED ORDER — TRUE METRIX BLOOD GLUCOSE TEST VI STRP: ORAL_STRIP | 12 refills | Status: DC

## 2019-08-09 MED ORDER — ATORVASTATIN CALCIUM 20 MG PO TABS: 20.0000 mg | ORAL_TABLET | Freq: Every day | ORAL | 2 refills | Status: DC

## 2019-08-09 MED ORDER — AMLODIPINE BESYLATE 10 MG PO TABS: 10.0000 mg | ORAL_TABLET | Freq: Every day | ORAL | 1 refills | Status: DC

## 2019-08-09 MED ORDER — TRAZODONE HCL 150 MG PO TABS: 150.0000 mg | ORAL_TABLET | Freq: Every day | ORAL | 1 refills | Status: DC

## 2019-08-09 MED ORDER — TRUEPLUS LANCETS 28G MISC: 3 refills | Status: DC

## 2019-08-09 MED FILL — TRAZODONE HCL 150 MG TABS: 150 | 30 days supply | Qty: 30 | Fill #0

## 2019-08-09 MED FILL — TRUEplus LANCETS 28G MISC: 100 days supply | Qty: 100 | Fill #0

## 2019-08-09 MED FILL — TRUE METRIX TEST STRIP: 100 days supply | Qty: 100 | Fill #0

## 2019-08-09 MED FILL — ESCITALOPRAM 5 MG TABLET: 5 | 30 days supply | Qty: 30 | Fill #0

## 2019-08-09 MED FILL — GLIMEPIRIDE 2 MG TABS: 2 | 30 days supply | Qty: 30 | Fill #0

## 2019-08-09 MED FILL — !TRUE METRIX BLOOD GLUCOSE: 365 days supply | Qty: 1 | Fill #0

## 2019-08-09 NOTE — Progress Notes (Signed)
Assessment & Plan:  Amanda Tran was seen today for medication refill.  Diagnoses and all orders for this visit:  Essential hypertension -     CMP14+EGFR -     amLODipine (NORVASC) 10 MG tablet; Take 1 tablet (10 mg total) by mouth daily. Continue all antihypertensives as prescribed.  Remember to bring in your blood pressure log with you for your follow up appointment.  DASH/Mediterranean Diets are healthier choices for HTN.    Controlled type 2 diabetes mellitus with hyperglycemia, without long-term current use of insulin (HCC) -     Glucose (CBG) -     HgB A1c -     CMP14+EGFR -     Microalbumin / creatinine urine ratio -     glimepiride (AMARYL) 2 MG tablet; Take 1 tablet (2 mg total) by mouth daily before breakfast. -     glucose blood (TRUE METRIX BLOOD GLUCOSE TEST) test strip; Use as instructed -     TRUEplus Lancets 28G MISC; Use as instructed. Check blood glucose level by fingerstick once per day. -     Blood Glucose Monitoring Suppl (TRUE METRIX METER) w/Device KIT; Use as instructed. Check blood glucose level by fingerstick once per day. Continue blood sugar control as discussed in office today, low carbohydrate diet, and regular physical exercise as tolerated, 150 minutes per week (30 min each day, 5 days per week, or 50 min 3 days per week). Keep blood sugar logs with fasting goal of 90-130 mg/dl, post prandial (after you eat) less than 180.  For Hypoglycemia: BS <60 and Hyperglycemia BS >400; contact the clinic ASAP. Annual eye exams and foot exams are recommended.   Vitamin D deficiency -     VITAMIN D 25 Hydroxy (Vit-D Deficiency, Fractures)  Colon cancer screening -     Fecal occult blood, imunochemical([facility])  Mixed hyperlipidemia -     atorvastatin (LIPITOR) 20 MG tablet; Take 1 tablet (20 mg total) by mouth daily. INSTRUCTIONS: Work on a low fat, heart healthy diet and participate in regular aerobic exercise program by working out at least 150 minutes  per week; 5 days a week-30 minutes per day. Avoid red meat/beef/steak,  fried foods. junk foods, sodas, sugary drinks, unhealthy snacking, alcohol and smoking.  Drink at least 80 oz of water per day and monitor your carbohydrate intake daily.    Iron deficiency anemia, unspecified iron deficiency anemia type -     ferrous sulfate 325 (65 FE) MG tablet; Take 1 tablet (325 mg total) by mouth 2 (two) times daily with a meal. -     CBC  Insomnia secondary to depression with anxiety -     traZODone (DESYREL) 150 MG tablet; Take 1 tablet (150 mg total) by mouth at bedtime. -     escitalopram (LEXAPRO) 5 MG tablet; Take 1 tablet (5 mg total) by mouth daily.    Patient has been counseled on age-appropriate routine health concerns for screening and prevention. These are reviewed and up-to-date. Referrals have been placed accordingly. Immunizations are up-to-date or declined.    Subjective:   Chief Complaint  Patient presents with  . Medication Refill    Pt. is here for medication refill.    HPI [name] 54 y.o. female presents to office today for follow up.  Essential Hypertension Well controlled. Taking amlodipine 10 mg daily. Denies chest pain, shortness of breath, palpitations, lightheadedness, dizziness, headaches or BLE edema. She does not monitor her blood pressure at home.  BP Readings  from Last 3 Encounters:  08/09/19 128/85  05/04/19 101/83  12/04/18 (!) 136/99    DM TYPE 2 Increased. Will start Amaryl today. She does not want to take metformin. Weight is up almost 10lbs. I have recommended she start exercising and avoid excessive carbohydrate intake.  Lab Results  Component Value Date   HGBA1C 7.0 (A) 08/09/2019   Lab Results  Component Value Date   HGBA1C 6.6 (H) 02/25/2019    Dyslipidemia LDL not at goal of <70. Taking stating Lipitor 20 mg daily. Denies statin intolerance.  Lab Results  Component Value Date   LDLCALC 100 (H) 02/25/2019     Depression Associated symptoms include insomnia. Trazodone 50-100 mg was ineffective. I have recommended increasing trazodone and adding 3-5 mg of melatonin. She was taking an antidepressant in the past however can not recall the name of it. Will start low dose lexapro today.  Depression screen [facility] 2/9 08/09/2019 03/31/2018 12/03/2017 05/08/2017 02/26/2017  Decreased Interest 2 0 0 3 1  Down, Depressed, Hopeless 0 0 0 0 0  PHQ - 2 Score 2 0 0 3 1  Altered sleeping '3 3 3 3 3  '$ Tired, decreased energy 2 0 '2 2 3  '$ Change in appetite 0 0 0 3 1  Feeling bad or failure about yourself  1 0 0 0 0  Trouble concentrating 0 0 0 0 0  Moving slowly or fidgety/restless 0 0 0 0 0  Suicidal thoughts 0 0 0 0 -  PHQ-9 Score '8 3 5 11 8   '$ Review of Systems  Constitutional: Negative for fever, malaise/fatigue and weight loss.  HENT: Negative.  Negative for nosebleeds.   Eyes: Negative.  Negative for blurred vision, double vision and photophobia.  Respiratory: Negative.  Negative for cough and shortness of breath.   Cardiovascular: Negative.  Negative for chest pain, palpitations and leg swelling.  Gastrointestinal: Negative.  Negative for heartburn, nausea and vomiting.  Musculoskeletal: Negative.  Negative for myalgias.  Neurological: Negative.  Negative for dizziness, focal weakness, seizures and headaches.  Psychiatric/Behavioral: Positive for depression. Negative for suicidal ideas. The patient has insomnia.     Past Medical History:  Diagnosis Date  . DVT (deep venous thrombosis) ([place]) 03/11/2012   Left popliteal   . Hyperlipidemia   . Hypertension     Past Surgical History:  Procedure Laterality Date  . [place], [id number], 1996  . TUBAL LIGATION      Family History  Problem Relation Age of Onset  . Hypertension Father   . Diabetes Father   . Kidney failure Mother     Social History Reviewed with no changes to be made today.   Outpatient Medications Prior to Visit   Medication Sig Dispense Refill  . Ascorbic Acid (VITAMIN C PO) Take by mouth.    . Multiple Vitamins-Minerals (HM MULTIVITAMIN ADULT GUMMY PO) Take by mouth.    . ferrous sulfate 325 (65 FE) MG tablet Take 1 tablet (325 mg total) by mouth 2 (two) times daily with a meal. (Patient taking differently: Take 325 mg by mouth 1 day or 1 dose. ) 60 tablet 6  . Vitamin D, Ergocalciferol, (DRISDOL) 1.25 MG (50000 UNIT) CAPS capsule Take 1 capsule (50,000 Units total) by mouth every 7 (seven) days. 90 day supply (Patient not taking: Reported on 08/09/2019) 12 capsule 0  . amLODipine (NORVASC) 10 MG tablet Take 1 tablet (10 mg total) by mouth daily. 90 tablet 0  . atorvastatin (  LIPITOR) 20 MG tablet Take 1 tablet (20 mg total) by mouth daily. 90 tablet 2   No facility-administered medications prior to visit.    No Known Allergies     Objective:    BP 128/85 (BP Location: Left Arm, Patient Position: Sitting, Cuff Size: Normal)   Pulse 93   Temp 97.7 F (36.5 C) (Temporal)   Ht '5\' 2"'$  (1.575 m)   Wt 204 lb (92.5 kg)   SpO2 100%   BMI 37.31 kg/m  Wt Readings from Last 3 Encounters:  08/09/19 204 lb (92.5 kg)  05/04/19 195 lb (88.5 kg)  09/23/18 192 lb (87.1 kg)    Physical Exam Vitals and nursing note reviewed.  Constitutional:      Appearance: She is well-developed.  HENT:     Head: Normocephalic and atraumatic.  Cardiovascular:     Rate and Rhythm: Normal rate and regular rhythm.     Heart sounds: Normal heart sounds. No murmur. No friction rub. No gallop.   Pulmonary:     Effort: Pulmonary effort is normal. No tachypnea or respiratory distress.     Breath sounds: Normal breath sounds. No decreased breath sounds, wheezing, rhonchi or rales.  Chest:     Chest wall: No tenderness.  Abdominal:     General: Bowel sounds are normal.     Palpations: Abdomen is soft.  Musculoskeletal:        General: Normal range of motion.     Cervical back: Normal range of motion.  Skin:     General: Skin is warm and dry.  Neurological:     Mental Status: She is alert and oriented to person, place, and time.     Coordination: Coordination normal.  Psychiatric:        Behavior: Behavior normal. Behavior is cooperative.        Thought Content: Thought content normal.        Judgment: Judgment normal.          Patient has been counseled extensively about nutrition and exercise as well as the importance of adherence with medications and regular follow-up. The patient was given clear instructions to go to ER or return to medical center if symptoms don't improve, worsen or new problems develop. The patient verbalized understanding.   Follow-up: Return in about 4 weeks (around 09/06/2019) for Depression, Insomnia, Meter check.   [name], FNP-BC [facility] and [place], [place]   08/09/2019, 1:12 PM

## 2019-08-10 LAB — CMP14+EGFR
ALT: 27 IU/L (ref 0–32)
AST: 39 IU/L (ref 0–40)
Albumin/Globulin Ratio: 1.5 (ref 1.2–2.2)
Albumin: 4.5 g/dL (ref 3.8–4.9)
Alkaline Phosphatase: 123 IU/L — ABNORMAL HIGH (ref 48–121)
BUN/Creatinine Ratio: 17 (ref 9–23)
BUN: 14 mg/dL (ref 6–24)
Bilirubin Total: 0.3 mg/dL (ref 0.0–1.2)
CO2: 23 mmol/L (ref 20–29)
Calcium: 9.7 mg/dL (ref 8.7–10.2)
Chloride: 101 mmol/L (ref 96–106)
Creatinine, Ser: 0.83 mg/dL (ref 0.57–1.00)
GFR calc Af Amer: 94 mL/min/{1.73_m2} (ref >59)
GFR calc non Af Amer: 81 mL/min/{1.73_m2} (ref >59)
Globulin, Total: 3 g/dL (ref 1.5–4.5)
Glucose: 179 mg/dL — ABNORMAL HIGH (ref 65–99)
Potassium: 4.6 mmol/L (ref 3.5–5.2)
Sodium: 141 mmol/L (ref 134–144)
Total Protein: 7.5 g/dL (ref 6.0–8.5)

## 2019-08-10 LAB — CBC
Hematocrit: 44.7 % (ref 34.0–46.6)
Hemoglobin: 14 g/dL (ref 11.1–15.9)
MCH: 26.5 pg — ABNORMAL LOW (ref 26.6–33.0)
MCHC: 31.3 g/dL — ABNORMAL LOW (ref 31.5–35.7)
MCV: 85 fL (ref 79–97)
Platelets: 307 10*3/uL (ref 150–450)
RBC: 5.28 x10E6/uL (ref 3.77–5.28)
RDW: 14.2 % (ref 11.7–15.4)
WBC: 6.2 10*3/uL (ref 3.4–10.8)

## 2019-08-10 LAB — VITAMIN D 25 HYDROXY (VIT D DEFICIENCY, FRACTURES): Vit D, 25-Hydroxy: 50.8 ng/mL (ref 30.0–100.0)

## 2019-08-10 LAB — MICROALBUMIN / CREATININE URINE RATIO
Creatinine, Urine: 117 mg/dL
Microalb/Creat Ratio: 5 mg/g creat (ref 0–29)
Microalbumin, Urine: 6.2 ug/mL

## 2019-08-27 ENCOUNTER — Telehealth: Payer: Self-pay

## 2019-08-27 LAB — FECAL OCCULT BLOOD, IMMUNOCHEMICAL: Fecal Occult Bld: POSITIVE — AB

## 2019-08-27 NOTE — Telephone Encounter (Signed)
Pt calling about results and plan for blood in stool results. Call pt [phone].

## 2019-08-28 ENCOUNTER — Other Ambulatory Visit: Payer: Self-pay | Admitting: Nurse Practitioner

## 2019-08-28 DIAGNOSIS — R195 Other fecal abnormalities: Secondary | ICD-10-CM

## 2019-08-28 DIAGNOSIS — Z1211 Encounter for screening for malignant neoplasm of colon: Secondary | ICD-10-CM

## 2019-08-30 ENCOUNTER — Other Ambulatory Visit: Payer: Self-pay | Admitting: Nurse Practitioner

## 2019-08-30 ENCOUNTER — Telehealth: Payer: Self-pay | Admitting: Nurse Practitioner

## 2019-08-30 ENCOUNTER — Encounter: Payer: Self-pay | Admitting: Nurse Practitioner

## 2019-08-30 MED ORDER — DOCUSATE SODIUM 100 MG PO CAPS: 100.0000 mg | ORAL_CAPSULE | Freq: Two times a day (BID) | ORAL | 1 refills | Status: AC

## 2019-08-30 NOTE — Telephone Encounter (Signed)
Patient  Called  Requesting to talk to nurse or provider regarding constipation . Please, call her back

## 2019-08-30 NOTE — Telephone Encounter (Signed)
Spoke to patient. She's been taking stool softener because her iron supplement makes her constipation.  CMA suggest she can take Miralax OTC to help her constipation. Pt. Also would like to ask PCP should her bottom hurts after the bowel movement. Pt. Request if PCP can send her a medication to help with her bowel movement.

## 2019-08-30 NOTE — Telephone Encounter (Signed)
Medication sent.

## 2019-08-30 NOTE — Telephone Encounter (Signed)
Pt. Was informed by MyChart on PCP advising on lab results.

## 2019-09-08 NOTE — Telephone Encounter (Signed)
Attempt to reach patient to inform on Rx. No answer and unable to LVM due to mailbox is full.

## 2019-09-09 MED FILL — GLIMEPIRIDE 2 MG TABS: 2 | 30 days supply | Qty: 30 | Fill #1

## 2019-09-09 MED FILL — ESCITALOPRAM 5 MG TABLET: 5 | 30 days supply | Qty: 30 | Fill #1

## 2019-09-09 MED FILL — ATORVASTATIN CALCIUM 20 MG: 20 | 30 days supply | Qty: 30 | Fill #3

## 2019-09-09 NOTE — Telephone Encounter (Signed)
Pt. Called back and was informed on Rx.

## 2019-09-22 ENCOUNTER — Encounter: Payer: Self-pay | Admitting: Nurse Practitioner

## 2019-09-22 ENCOUNTER — Ambulatory Visit (INDEPENDENT_AMBULATORY_CARE_PROVIDER_SITE_OTHER): Payer: 59 | Admitting: Nurse Practitioner

## 2019-09-22 VITALS — BP 112/78 | HR 92 | Ht 61.5 in | Wt 201.2 lb

## 2019-09-22 DIAGNOSIS — R195 Other fecal abnormalities: Secondary | ICD-10-CM

## 2019-09-22 DIAGNOSIS — K5900 Constipation, unspecified: Secondary | ICD-10-CM

## 2019-09-22 DIAGNOSIS — K640 First degree hemorrhoids: Secondary | ICD-10-CM

## 2019-09-22 DIAGNOSIS — K625 Hemorrhage of anus and rectum: Secondary | ICD-10-CM | POA: Diagnosis not present

## 2019-09-22 DIAGNOSIS — K5909 Other constipation: Secondary | ICD-10-CM

## 2019-09-22 MED ORDER — NA SULFATE-K SULFATE-MG SULF 17.5-3.13-1.6 GM/177ML PO SOLN: 1.0000 | Freq: Once | ORAL | 0 refills | Status: AC

## 2019-09-22 NOTE — Patient Instructions (Signed)
If you are age 52 or older, your body mass index should be between 23-30. Your Body mass index is 37.41 kg/m. If this is out of the aforementioned range listed, please consider follow up with your Primary Care Provider.  If you are age 28 or younger, your body mass index should be between 19-25. Your Body mass index is 37.41 kg/m. If this is out of the aformentioned range listed, please consider follow up with your Primary Care Provider.   1. Hold iron  1 week before colonoscopy 2. Use Miralax every evening at bedtime as needed to avoid straining 3. Apply a small amount of Desitin inside the anal opening and to the external anal area tid as needed for anal or hemorrhoidal irritation/bleeding.   We have sent the following medications to your pharmacy for you to pick up at your convenience: Suprep    Due to recent changes in healthcare laws, you may see the results of your imaging and laboratory studies on MyChart before your provider has had a chance to review them.  We understand that in some cases there may be results that are confusing or concerning to you. Not all laboratory results come back in the same time frame and the provider may be waiting for multiple results in order to interpret others.  Please give [place] 48 hours in order for your provider to thoroughly review all the results before contacting the office for clarification of your results.   Thank you for choosing [facility] Noralyn Pick, CRNP

## 2019-09-22 NOTE — Progress Notes (Signed)
09/22/2019 Amanda Tran [id number] Dec 11, 1965   CHIEF COMPLAINT: Blood in stool  HISTORY OF PRESENT ILLNESS:  Amanda Tran is a 54 year old female with a past medical history of anxiety, depression hypertension, hyperlipidemia, DM II, LLE DVT 2013 and IDA. Past C section x 4 and tubal ligation. She presents to our office today as referred by her PCP [name] NP for further evaluation for rectal bleeding with a  + FOBT completed 08/25/2019.  She was recently prescribed Glimepiride '2mg'$  po daily for DM II which was associated to the onset of constipation. She developed constipation 2 to 3 weeks ago, strained to pass a difficult BM which resulted in seeing red blood on the stool x 1 day then a moderate amount of red blood on the stool the next day. She complained of having anal discomfort, no severe pain. No upper or lower abdominal pain. She took a stool softener and since then she denies having any further rectal bleeding or anorectal pain. She previously saw red blood on the stool 2 to 3 times over the past year. She's never undergone a screening colonoscopy. No family history of colon cancer. She was diagnosed with an unprovoked LLE DVT 02/2012 for which she was prescribed Xarelto. She developed menorrhagia which required further evaluation at [facility] 12/25//2013. Hg 11 -> 9.1. She was placed on Ferrous Sulfate '325mg'$  po bid and Megace, followed by gyn and her menorrhagia resolved. No longer on Xarelto. It is unclear if she was every seen by a hematologist. She remains on Ferrous Sulfate since then, now taking Ferrous Sulfate '325mg'$  once daily. She is perimenopausal at this time. She had an active menstrual cycle in January then absence of menstrual bleeding until 09/13/2019. She intends to schedule a follow up appointment with her gynecologist. No GERD symptoms. No other complaints today.    CBC Latest Ref Rng & Units 08/09/2019 04/21/2019 02/25/2019  WBC 3.4 - 10.8 x10E3/uL 6.2  4.7 4.7  Hemoglobin 11.1 - 15.9 g/dL 14.0 13.1 11.9  Hematocrit 34.0 - 46.6 % 44.7 41.7 39.0  Platelets 150 - 450 x10E3/uL 307 315 304    Social History: She has 2 sons and 3 daughters. Past smoker, Smoked 1ppd x 10+ years,  quit smoking 2 years ago. She drinks a wine cooler or mixed drink once monthly. No histoyry of drug use.   Family History: Mother died age 34 kidney failure. Father died age 46 DM, HTN.     Outpatient Encounter Medications as of 09/22/2019  Medication Sig  . amLODipine (NORVASC) 10 MG tablet Take 1 tablet (10 mg total) by mouth daily.  . Ascorbic Acid (VITAMIN C PO) Take by mouth.  Marland Kitchen atorvastatin (LIPITOR) 20 MG tablet Take 1 tablet (20 mg total) by mouth daily.  . Blood Glucose Monitoring Suppl (TRUE METRIX METER) w/Device KIT Use as instructed. Check blood glucose level by fingerstick once per day.  . escitalopram (LEXAPRO) 5 MG tablet Take 1 tablet (5 mg total) by mouth daily.  . ferrous sulfate 325 (65 FE) MG tablet Take 1 tablet (325 mg total) by mouth 2 (two) times daily with a meal.  . glimepiride (AMARYL) 2 MG tablet Take 1 tablet (2 mg total) by mouth daily before breakfast.  . glucose blood (TRUE METRIX BLOOD GLUCOSE TEST) test strip Use as instructed  . Multiple Vitamins-Minerals (HM MULTIVITAMIN ADULT GUMMY PO) Take by mouth.  . traZODone (DESYREL) 150 MG tablet Take 1 tablet (150 mg total) by  mouth at bedtime.  . TRUEplus Lancets 28G MISC Use as instructed. Check blood glucose level by fingerstick once per day.  . Vitamin D, Ergocalciferol, (DRISDOL) 1.25 MG (50000 UNIT) CAPS capsule Take 1 capsule (50,000 Units total) by mouth every 7 (seven) days. 90 day supply (Patient not taking: Reported on 08/09/2019)   No facility-administered encounter medications on file as of 09/22/2019.   No known drug allergies   REVIEW OF SYSTEMS:  Gen: Denies fever, sweats or chills. No weight loss.  CV: Denies chest pain, palpitations or edema. Resp: Denies cough,  shortness of breath of hemoptysis.  GI: See HPI.   GU : Denies urinary burning, blood in urine, increased urinary frequency or incontinence. MS: Denies joint pain, muscles aches or weakness. Derm: Denies rash, itchiness, skin lesions or unhealing ulcers. Psych: Denies depression, anxiety, memory loss, suicidal ideation and confusion. Heme: Denies bruising, bleeding. Neuro:  Denies headaches, dizziness or paresthesias. Endo:  + DM II  PHYSICAL EXAM: BP 112/78 (BP Location: Left Arm, Patient Position: Sitting, Cuff Size: Normal)   Pulse 92   Ht 5' 1.5" (1.562 m) Comment: height measured without shoes  Wt 201 lb 4 oz (91.3 kg)   LMP 09/12/2019   BMI 37.41 kg/m   General: Well developed 54 year old female in no acute distress. Head: Normocephalic and atraumatic. Eyes:  Sclerae non-icteric, conjunctive pink. Ears: Normal auditory acuity. Mouth: Upper and lower dentures.  No ulcers or lesions.  Neck: Supple, no lymphadenopathy or thyromegaly.  Lungs: Clear bilaterally to auscultation without wheezes, crackles or rhonchi. Heart: Regular rate and rhythm. No murmur, rub or gallop appreciated.  Abdomen: Soft, nontender, non distended. No masses. No hepatosplenomegaly. Normoactive bowel sounds x 4 quadrants.  Rectal: Mildly inflamed anal hemorrhoids, no active bleeding. No anal fissure. Small amount of hard stool in the rectal vault. No mass. Heather CMA present during exam.  Musculoskeletal: Symmetrical with no gross deformities. Skin: Warm and dry. No rash or lesions on visible extremities. Extremities: No edema. Neurological: Alert oriented x 4, no focal deficits.  Psychological:  Alert and cooperative. Normal mood and affect.  ASSESSMENT AND PLAN:  33.  54 year old female with constipation, rectal bleeding and positive FOBT. Anal hemorrhoids.  -MiraLAX nightly to avoid straining -Apply a small amount of Desitin inside the anal opening and to the external anal area tid as needed for  anal or hemorrhoidal irritation/bleeding.  -Colonoscopy  benefits and risks discussed including risk with sedation, risk of bleeding, perforation and infection  -Patient to hold ferrous sulfate for 1 week prior to colonoscopy -Further follow-up to be determined after the above evaluation completed -To call our office if her symptoms worsen  2.  Past history of iron deficiency anemia secondary to menorrhagia while on Xarelto for a LLE DVT in 2013,  resolved. Hg 14. Patient remains on Ferrous Sulfate '325mg'$  once daily.   3. DM II         CC:  [name], NP

## 2019-09-22 NOTE — Progress Notes (Signed)
Reviewed and agree with management plan.  Bethany Hirt T. Meighan Treto, MD FACG  Gastroenterology  

## 2019-09-30 ENCOUNTER — Encounter: Payer: Self-pay | Admitting: Gastroenterology

## 2019-10-04 ENCOUNTER — Ambulatory Visit (AMBULATORY_SURGERY_CENTER): Payer: 59 | Admitting: Gastroenterology

## 2019-10-04 ENCOUNTER — Other Ambulatory Visit: Payer: Self-pay

## 2019-10-04 ENCOUNTER — Encounter: Payer: Self-pay | Admitting: Gastroenterology

## 2019-10-04 VITALS — BP 123/82 | HR 74 | Temp 98.0°F | Resp 12 | Ht 61.0 in | Wt 201.0 lb

## 2019-10-04 DIAGNOSIS — D123 Benign neoplasm of transverse colon: Secondary | ICD-10-CM | POA: Diagnosis not present

## 2019-10-04 DIAGNOSIS — D124 Benign neoplasm of descending colon: Secondary | ICD-10-CM

## 2019-10-04 DIAGNOSIS — R195 Other fecal abnormalities: Secondary | ICD-10-CM

## 2019-10-04 MED ORDER — SODIUM CHLORIDE 0.9 % IV SOLN: 500.0000 mL | Freq: Once | INTRAVENOUS | Status: DC

## 2019-10-04 NOTE — Progress Notes (Signed)
Pt's states no medical or surgical changes since previsit or office visit.  CW - vitals 

## 2019-10-04 NOTE — Progress Notes (Signed)
Report given to PACU, vss 

## 2019-10-04 NOTE — Patient Instructions (Signed)
No aspirin, aleve or ibuprofen for 2 weeks to prevent bleeeding. You will need another colonoscopy in 3 years. Read all of the discharge instructions given to you by your recovery room nurse.  Thank-you for choosing [place] for your healthcare needs today.  YOU HAD AN ENDOSCOPIC PROCEDURE TODAY AT [facility]:   Refer to the procedure report that was given to you for any specific questions about what was found during the examination.  If the procedure report does not answer your questions, please call your gastroenterologist to clarify.  If you requested that your care partner not be given the details of your procedure findings, then the procedure report has been included in a sealed envelope for you to review at your convenience later.  YOU SHOULD EXPECT: Some feelings of bloating in the abdomen. Passage of more gas than usual.  Walking can help get rid of the air that was put into your GI tract during the procedure and reduce the bloating. If you had a lower endoscopy (such as a colonoscopy or flexible sigmoidoscopy) you may notice spotting of blood in your stool or on the toilet paper. If you underwent a bowel prep for your procedure, you may not have a normal bowel movement for a few days.  Please Note:  You might notice some irritation and congestion in your nose or some drainage.  This is from the oxygen used during your procedure.  There is no need for concern and it should clear up in a day or so.  SYMPTOMS TO REPORT IMMEDIATELY:   Following lower endoscopy (colonoscopy or flexible sigmoidoscopy):  Excessive amounts of blood in the stool  Significant tenderness or worsening of abdominal pains  Swelling of the abdomen that is new, acute  Fever of 100F or higher   For urgent or emergent issues, a gastroenterologist can be reached at any hour by calling [phone]. Do not use MyChart messaging for urgent concerns.    DIET:  We do recommend a small meal at first, but then  you may proceed to your regular diet.  Drink plenty of fluids but you should avoid alcoholic beverages for 24 hours.  ACTIVITY:  You should plan to take it easy for the rest of today and you should NOT DRIVE or use heavy machinery until tomorrow (because of the sedation medicines used during the test).    FOLLOW UP: Our staff will call the number listed on your records 48-72 hours following your procedure to check on you and address any questions or concerns that you may have regarding the information given to you following your procedure. If we do not reach you, we will leave a message.  We will attempt to reach you two times.  During this call, we will ask if you have developed any symptoms of COVID 19. If you develop any symptoms (ie: fever, flu-like symptoms, shortness of breath, cough etc.) before then, please call [phone].  If you test positive for Covid 19 in the 2 weeks post procedure, please call and report this information to [place].    If any biopsies were taken you will be contacted by phone or by letter within the next 1-3 weeks.  Please call us at [phone] if you have not heard about the biopsies in 3 weeks.    SIGNATURES/CONFIDENTIALITY: You and/or your care partner have signed paperwork which will be entered into your electronic medical record.  These signatures attest to the fact that that the information above on  your After Visit Summary has been reviewed and is understood.  Full responsibility of the confidentiality of this discharge information lies with you and/or your care-partner. 

## 2019-10-04 NOTE — Op Note (Signed)
[facility] Patient Name: [place] Procedure Date: 10/04/2019 11:06 AM MRN: [id number] Endoscopist: Ladene Artist , MD Age: 54 Referring MD:  Date of Birth: 07-Sep-1965 Gender: Female Account #: [id number] Procedure:                Colonoscopy Indications:              Hematochezia, Positive fecal immunochemical test Medicines:                Monitored Anesthesia Care Procedure:                Pre-Anesthesia Assessment:                           - Prior to the procedure, a History and Physical                            was performed, and patient medications and                            allergies were reviewed. The patient's tolerance of                            previous anesthesia was also reviewed. The risks                            and benefits of the procedure and the sedation                            options and risks were discussed with the patient.                            All questions were answered, and informed consent                            was obtained. Prior Anticoagulants: The patient has                            taken no previous anticoagulant or antiplatelet                            agents. ASA Grade Assessment: II - A patient with                            mild systemic disease. After reviewing the risks                            and benefits, the patient was deemed in                            satisfactory condition to undergo the procedure.                           After obtaining informed consent, the colonoscope  was passed under direct vision. Throughout the                            procedure, the patient's blood pressure, pulse, and                            oxygen saturations were monitored continuously. The                            Colonoscope was introduced through the anus and                            advanced to the the cecum, identified by                            appendiceal  orifice and ileocecal valve. The                            ileocecal valve, appendiceal orifice, and rectum                            were photographed. The quality of the bowel                            preparation was good after extensive lavage and                            suction. The colonoscopy was performed without                            difficulty. The patient tolerated the procedure                            well. Scope In: 11:13:37 AM Scope Out: 11:34:32 AM Scope Withdrawal Time: 0 hours 18 minutes 2 seconds  Total Procedure Duration: 0 hours 20 minutes 55 seconds  Findings:                 The perianal and digital rectal examinations were                            normal.                           A 7 mm polyp was found in the transverse colon. The                            polyp was sessile. The polyp was removed with a                            cold snare. Resection and retrieval were complete.                           Three pedunculated and sessile polyps were found in  the descending colon (1) and transverse colon (2).                            The polyps were 8 to 15 mm in size. These polyps                            were removed with a hot snare. Resection and                            retrieval were complete.                           The exam was otherwise without abnormality on                            direct and retroflexion views. Complications:            No immediate complications. Estimated blood loss:                            None. Estimated Blood Loss:     Estimated blood loss: none. Impression:               - One 7 mm polyp in the transverse colon, removed                            with a cold snare. Resected and retrieved.                           - Three 8 to 15 mm polyps in the descending colon                            and in the transverse colon, removed with a hot                            snare.  Resected and retrieved.                           - The examination was otherwise normal on direct                            and retroflexion views. Recommendation:           - Repeat colonoscopy after studies are complete,                            likely 3 years, for surveillance based on pathology                            results.                           - Patient has a contact number available for  emergencies. The signs and symptoms of potential                            delayed complications were discussed with the                            patient. Return to normal activities tomorrow.                            Written discharge instructions were provided to the                            patient.                           - Resume previous diet.                           - Continue present medications.                           - Await pathology results.                           - No aspirin, ibuprofen, naproxen, or other                            non-steroidal anti-inflammatory drugs for 2 weeks                            after polyp removal. Ladene Artist, MD 10/04/2019 11:40:40 AM This report has been signed electronically.

## 2019-10-04 NOTE — Progress Notes (Signed)
Called to room to assist during endoscopic procedure.  Patient ID and intended procedure confirmed with present staff. Received instructions for my participation in the procedure from the performing physician.  

## 2019-10-06 ENCOUNTER — Telehealth: Payer: Self-pay

## 2019-10-06 ENCOUNTER — Telehealth: Payer: Self-pay | Admitting: *Deleted

## 2019-10-06 NOTE — Telephone Encounter (Signed)
Message left

## 2019-10-06 NOTE — Telephone Encounter (Signed)
Called [phone] and left a message we tried to reach pt for a follow up call. maw

## 2019-10-07 ENCOUNTER — Encounter: Payer: Self-pay | Admitting: Gastroenterology

## 2019-10-15 ENCOUNTER — Telehealth: Payer: Self-pay | Admitting: Nurse Practitioner

## 2019-10-15 MED FILL — AMLODIPINE BESYLATE 10 MG T: 10 | 90 days supply | Qty: 90 | Fill #0

## 2019-10-15 NOTE — Telephone Encounter (Signed)
Patient came in saying that her trazodone pill is to big and she would like to know if she can get a smaller pill. Patient states that the dosage is fine. Please f/u

## 2019-10-15 NOTE — Telephone Encounter (Signed)
Informed patient to break the pill in half and it will be much easier for her to swallow. Pt. Understood.

## 2020-01-07 ENCOUNTER — Other Ambulatory Visit: Payer: Self-pay | Admitting: Nurse Practitioner

## 2020-01-07 DIAGNOSIS — E1165 Type 2 diabetes mellitus with hyperglycemia: Secondary | ICD-10-CM

## 2020-01-07 NOTE — Telephone Encounter (Signed)
Requested Prescriptions  Pending Prescriptions Disp Refills  . glimepiride (AMARYL) 2 MG tablet [Pharmacy Med Name: Glimepiride 2 MG Oral Tablet] 30 tablet 0    Sig: TAKE 1 TABLET BY MOUTH ONCE DAILY BEFORE BREAKFAST     Endocrinology:  Diabetes - Sulfonylureas Passed - 01/07/2020  2:33 PM      Passed - HBA1C is between 0 and 7.9 and within 180 days    Hemoglobin A1C  Date Value Ref Range Status  08/09/2019 7.0 (A) 4.0 - 5.6 % Final   Hgb A1c MFr Bld  Date Value Ref Range Status  02/25/2019 6.6 (H) 4.8 - 5.6 % Final    Comment:             Prediabetes: 5.7 - 6.4          Diabetes: >6.4          Glycemic control for adults with diabetes: <7.0          Passed - Valid encounter within last 6 months    Recent Outpatient Visits          5 months ago Essential hypertension   [place], Vernia Buff, NP   8 months ago Bruising, spontaneous   [place], Vernia Buff, NP   10 months ago Essential hypertension   [place], Vernia Buff, NP   1 year ago Ganglion cyst of foot   [place], Vernia Buff, NP   1 year ago Bacterial vaginosis   [place] [name], NP              Phone call to pt.  Left vm to call and schedule f/u appt. With PCP; last seen in May 2021; overdue for her 4 week f/u appt.  Gave courtesy refill of #30 at this time.

## 2020-01-08 ENCOUNTER — Ambulatory Visit: Payer: 59 | Attending: Internal Medicine

## 2020-01-08 DIAGNOSIS — Z23 Encounter for immunization: Secondary | ICD-10-CM

## 2020-01-08 NOTE — Progress Notes (Signed)
   Covid-19 Vaccination Clinic  Name:  Amanda Tran    MRN: [id number] DOB: 08/18/1965  01/08/2020  Amanda Tran was observed post Covid-19 immunization for 15 minutes without incident. She was provided with Vaccine Information Sheet and instruction to access the V-Safe system.   Amanda Tran was instructed to call 911 with any severe reactions post vaccine: Marland Kitchen Difficulty breathing  . Swelling of face and throat  . A fast heartbeat  . A bad rash all over body  . Dizziness and weakness

## 2020-01-11 ENCOUNTER — Other Ambulatory Visit: Payer: Self-pay | Admitting: Nurse Practitioner

## 2020-01-11 DIAGNOSIS — E1165 Type 2 diabetes mellitus with hyperglycemia: Secondary | ICD-10-CM

## 2020-01-11 NOTE — Telephone Encounter (Signed)
Disp Refills Start End   glimepiride (AMARYL) 2 MG tablet 30 tablet 0 01/07/2020    Sig: TAKE 1 TABLET BY MOUTH ONCE DAILY BEFORE BREAKFAST   Sent to pharmacy as: glimepiride (AMARYL) 2 MG tablet   Notes to Pharmacy: Needs appt. For future refills.   E-Prescribing Status: Verification status not available for this order

## 2020-02-23 ENCOUNTER — Ambulatory Visit: Payer: 59 | Attending: Nurse Practitioner | Admitting: Nurse Practitioner

## 2020-02-23 ENCOUNTER — Encounter: Payer: Self-pay | Admitting: Nurse Practitioner

## 2020-02-23 ENCOUNTER — Telehealth: Payer: Self-pay | Admitting: Nurse Practitioner

## 2020-02-23 ENCOUNTER — Other Ambulatory Visit: Payer: Self-pay

## 2020-02-23 VITALS — BP 104/73 | HR 78 | Temp 97.7°F | Ht 61.5 in | Wt 209.4 lb

## 2020-02-23 DIAGNOSIS — I10 Essential (primary) hypertension: Secondary | ICD-10-CM | POA: Diagnosis not present

## 2020-02-23 DIAGNOSIS — D500 Iron deficiency anemia secondary to blood loss (chronic): Secondary | ICD-10-CM | POA: Diagnosis not present

## 2020-02-23 DIAGNOSIS — E782 Mixed hyperlipidemia: Secondary | ICD-10-CM

## 2020-02-23 DIAGNOSIS — E1165 Type 2 diabetes mellitus with hyperglycemia: Secondary | ICD-10-CM | POA: Diagnosis not present

## 2020-02-23 DIAGNOSIS — Z1159 Encounter for screening for other viral diseases: Secondary | ICD-10-CM

## 2020-02-23 LAB — GLUCOSE, POCT (MANUAL RESULT ENTRY): POC Glucose: 290 mg/dl — AB (ref 70–99)

## 2020-02-23 LAB — POCT GLYCOSYLATED HEMOGLOBIN (HGB A1C): Hemoglobin A1C: 11 % — AB (ref 4.0–5.6)

## 2020-02-23 MED ORDER — TRUE METRIX BLOOD GLUCOSE TEST VI STRP: ORAL_STRIP | 12 refills | Status: DC

## 2020-02-23 MED ORDER — SITAGLIPTIN PHOSPHATE 100 MG PO TABS: 100.0000 mg | ORAL_TABLET | Freq: Every day | ORAL | 1 refills | Status: DC

## 2020-02-23 MED ORDER — ATORVASTATIN CALCIUM 20 MG PO TABS: 20.0000 mg | ORAL_TABLET | Freq: Every day | ORAL | 2 refills | Status: DC

## 2020-02-23 MED ORDER — TRUEPLUS LANCETS 28G MISC: 3 refills | Status: DC

## 2020-02-23 MED ORDER — AMLODIPINE BESYLATE 10 MG PO TABS: 10.0000 mg | ORAL_TABLET | Freq: Every day | ORAL | 1 refills | Status: DC

## 2020-02-23 MED ORDER — GLIMEPIRIDE 4 MG PO TABS: 4.0000 mg | ORAL_TABLET | Freq: Every day | ORAL | 0 refills | Status: DC

## 2020-02-23 NOTE — Telephone Encounter (Signed)
Pt called stating that the medication she received to help regulate blood sugar, Januvia, she cannot afford. She states that is $192. Pt is requesting to have a different medication sent in for her. Please advise.      [facility], [address]  [address] [place] [address]  Phone: [phone] Fax: [phone]  Hours: Not open 24 hours

## 2020-02-23 NOTE — Telephone Encounter (Signed)
Will forward to patient's provider. Januvia is too expensive per pt. [facility] will likely only cover generics, including insulin.

## 2020-02-23 NOTE — Patient Instructions (Signed)
Blood sugars should be 90-130 in the morning fasting  Blood sugars should be less than 180 1hr after meals

## 2020-02-23 NOTE — Progress Notes (Signed)
Assessment & Plan:  Amanda Tran was seen today for follow-up.  Diagnoses and all orders for this visit:  Type 2 diabetes mellitus with hyperglycemia, without long-term current use of insulin (HCC) -     Glucose (CBG) -     HgB A1c -     TRUEplus Lancets 28G MISC; Use as instructed. Check blood glucose level by fingerstick once per day. -     glucose blood (TRUE METRIX BLOOD GLUCOSE TEST) test strip; Use as instructed -     CMP14+EGFR -     glimepiride (AMARYL) 4 MG tablet; Take 1 tablet (4 mg total) by mouth daily with breakfast.  Essential hypertension -     amLODipine (NORVASC) 10 MG tablet; Take 1 tablet (10 mg total) by mouth daily. -     CMP14+EGFR Continue all antihypertensives as prescribed.  Remember to bring in your blood pressure log with you for your follow up appointment.  DASH/Mediterranean Diets are healthier choices for HTN.    Mixed hyperlipidemia -     atorvastatin (LIPITOR) 20 MG tablet; Take 1 tablet (20 mg total) by mouth daily. -     Lipid panel INSTRUCTIONS: Work on a low fat, heart healthy diet and participate in regular aerobic exercise program by working out at least 150 minutes per week; 5 days a week-30 minutes per day. Avoid red meat/beef/steak,  fried foods. junk foods, sodas, sugary drinks, unhealthy snacking, alcohol and smoking.  Drink at least 80 oz of water per day and monitor your carbohydrate intake daily.    Iron deficiency anemia due to chronic blood loss -     CBC  Need for hepatitis C screening test -     HCV Ab w Reflex to Quant PCR  Other orders -     sitaGLIPtin (JANUVIA) 100 MG tablet; Take 1 tablet (100 mg total) by mouth daily.    Patient has been counseled on age-appropriate routine health concerns for screening and prevention. These are reviewed and up-to-date. Referrals have been placed accordingly. Immunizations are up-to-date or declined.    Subjective:   Chief Complaint  Patient presents with   Follow-up    Pt. is here  for diabetes follow up.    HPI Amanda Tran 54 y.o. female presents to office today for follow up. Past medical history includes anemia (not taking iron tablets), DVT, hypertension, hyperlipidemia and diabetes.  ESSENTIAL HYPERTENSION Blood pressure is well controlled today with amlodipine 10 mg daily. Denies chest pain, shortness of breath, palpitations, lightheadedness, dizziness, headaches or BLE edema.  BP Readings from Last 3 Encounters:  02/23/20 104/73  10/04/19 123/82  09/22/19 112/78    DM 2 A1c has gone up from 7-11.  She adamantly declines to take Metformin, injectables or insulin.  Currently taking 2 mg of glimepiride daily which I will increase to 4 mg daily and add Januvia 100 mg daily.  She will follow-up in 4 weeks with her meter and has been instructed to monitor her blood glucose levels at home twice a day fasting and postprandial.  Main reason for elevation in A1c is lack of exercise, increased intake of sweets and sugary drinks which the patient is aware of.  LDL not at goal with atorvastatin 20 mg daily. Lab Results  Component Value Date   HGBA1C 11.0 (A) 02/23/2020   Lab Results  Component Value Date   HGBA1C 7.0 (A) 08/09/2019   Lab Results  Component Value Date   LDLCALC 100 (H) 02/25/2019  Review of Systems  °Constitutional: Negative for fever, malaise/fatigue and weight loss.  °HENT: Negative.  Negative for nosebleeds.   °Eyes: Negative.  Negative for blurred vision, double vision and photophobia.  °Respiratory: Negative.  Negative for cough and shortness of breath.   °Cardiovascular: Negative.  Negative for chest pain, palpitations and leg swelling.  °Gastrointestinal: Negative.  Negative for heartburn, nausea and vomiting.  °Musculoskeletal: Negative.  Negative for myalgias.  °Neurological: Negative.  Negative for dizziness, focal weakness, seizures and headaches.  °Psychiatric/Behavioral: Negative.  Negative for suicidal ideas.  ° ° °Past  Medical History:  °Diagnosis Date  °• Anemia   °• Anxiety   °• Diabetes (HCC)   °• DVT (deep venous thrombosis) (HCC) 03/11/2012  ° Left popliteal   °• Hyperlipidemia   °• Hypertension   °• Vitamin D deficiency   ° ° °Past Surgical History:  °Procedure Laterality Date  °• CESAREAN SECTION    ° 1990, 1993.1994, 1996  °• TUBAL LIGATION    ° ° °Family History  °Problem Relation Age of Onset  °• Hypertension Father   °• Diabetes Father   °• Kidney failure Mother   °• Diabetes Sister   °• Colon cancer Neg Hx   °• Esophageal cancer Neg Hx   °• Stomach cancer Neg Hx   °• Rectal cancer Neg Hx   ° ° °Social History Reviewed with no changes to be made today.  ° °Outpatient Medications Prior to Visit  °Medication Sig Dispense Refill  °• Blood Glucose Monitoring Suppl (TRUE METRIX METER) w/Device KIT Use as instructed. Check blood glucose level by fingerstick once per day. 1 kit 0  °• glimepiride (AMARYL) 2 MG tablet TAKE 1 TABLET BY MOUTH ONCE DAILY BEFORE BREAKFAST 30 tablet 0  °• glucose blood (TRUE METRIX BLOOD GLUCOSE TEST) test strip Use as instructed 100 each 12  °• TRUEplus Lancets 28G MISC Use as instructed. Check blood glucose level by fingerstick once per day. 100 each 3  °• escitalopram (LEXAPRO) 5 MG tablet Take 1 tablet (5 mg total) by mouth daily. (Patient not taking: Reported on 02/23/2020) 30 tablet 2  °• ferrous sulfate 325 (65 FE) MG tablet Take 1 tablet (325 mg total) by mouth 2 (two) times daily with a meal. (Patient not taking: Reported on 02/23/2020) 60 tablet 6  °• amLODipine (NORVASC) 10 MG tablet Take 1 tablet (10 mg total) by mouth daily. 90 tablet 1  °• Ascorbic Acid (VITAMIN C PO) Take by mouth.    °• atorvastatin (LIPITOR) 20 MG tablet Take 1 tablet (20 mg total) by mouth daily. 90 tablet 2  °• Multiple Vitamins-Minerals (HM MULTIVITAMIN ADULT GUMMY PO) Take by mouth.    °• traZODone (DESYREL) 150 MG tablet Take 150 mg by mouth at bedtime as needed for sleep. (Patient not taking: Reported on  09/22/2019)    ° °No facility-administered medications prior to visit.  ° ° °No Known Allergies ° °   °Objective:  °  °BP 104/73 (BP Location: Left Arm, Patient Position: Sitting, Cuff Size: Large)    Pulse 78    Temp 97.7 °F (36.5 °C) (Temporal)    Ht 5' 1.5" (1.562 m)    Wt 209 lb 6.4 oz (95 kg)    LMP 02/14/2020    SpO2 97%    BMI 38.93 kg/m²  °Wt Readings from Last 3 Encounters:  °02/23/20 209 lb 6.4 oz (95 kg)  °10/04/19 201 lb (91.2 kg)  °09/22/19 201 lb 4 oz (91.3 kg)  ° ° °  Physical Exam °Vitals and nursing note reviewed.  °Constitutional:   °   Appearance: She is well-developed.  °HENT:  °   Head: Normocephalic and atraumatic.  °Cardiovascular:  °   Rate and Rhythm: Normal rate and regular rhythm.  °   Heart sounds: Normal heart sounds. No murmur heard.  °No friction rub. No gallop.   °Pulmonary:  °   Effort: Pulmonary effort is normal. No tachypnea or respiratory distress.  °   Breath sounds: Normal breath sounds. No decreased breath sounds, wheezing, rhonchi or rales.  °Chest:  °   Chest wall: No tenderness.  °Abdominal:  °   General: Bowel sounds are normal.  °   Palpations: Abdomen is soft.  °Musculoskeletal:     °   General: Normal range of motion.  °   Cervical back: Normal range of motion.  °Skin: °   General: Skin is warm and dry.  °Neurological:  °   Mental Status: She is alert and oriented to person, place, and time.  °   Coordination: Coordination normal.  °Psychiatric:     °   Behavior: Behavior normal. Behavior is cooperative.     °   Thought Content: Thought content normal.     °   Judgment: Judgment normal.  ° ° ° ° ° °   °Patient has been counseled extensively about nutrition and exercise as well as the importance of adherence with medications and regular follow-up. The patient was given clear instructions to go to ER or return to medical center if symptoms don't improve, worsen or new problems develop. The patient verbalized understanding.  ° °Follow-up: Return in about 4 weeks (around  03/22/2020) for meter check with luke in 4 weeks. See me in 3 months.  ° °Zelda W Fleming, FNP-BC ° [facility] °[place], [place] °[phone]   °02/23/2020, 1:05 PM °

## 2020-02-24 LAB — CMP14+EGFR
ALT: 24 IU/L (ref 0–32)
AST: 30 IU/L (ref 0–40)
Albumin/Globulin Ratio: 1.5 (ref 1.2–2.2)
Albumin: 4.4 g/dL (ref 3.8–4.9)
Alkaline Phosphatase: 134 IU/L — ABNORMAL HIGH (ref 44–121)
BUN/Creatinine Ratio: 13 (ref 9–23)
BUN: 12 mg/dL (ref 6–24)
Bilirubin Total: 0.4 mg/dL (ref 0.0–1.2)
CO2: 23 mmol/L (ref 20–29)
Calcium: 9.8 mg/dL (ref 8.7–10.2)
Chloride: 99 mmol/L (ref 96–106)
Creatinine, Ser: 0.91 mg/dL (ref 0.57–1.00)
GFR calc Af Amer: 84 mL/min/{1.73_m2} (ref >59)
GFR calc non Af Amer: 73 mL/min/{1.73_m2} (ref >59)
Globulin, Total: 3 g/dL (ref 1.5–4.5)
Glucose: 260 mg/dL — ABNORMAL HIGH (ref 65–99)
Potassium: 4.4 mmol/L (ref 3.5–5.2)
Sodium: 137 mmol/L (ref 134–144)
Total Protein: 7.4 g/dL (ref 6.0–8.5)

## 2020-02-24 LAB — LIPID PANEL
Chol/HDL Ratio: 5.3 ratio — ABNORMAL HIGH (ref 0.0–4.4)
Cholesterol, Total: 200 mg/dL — ABNORMAL HIGH (ref 100–199)
HDL: 38 mg/dL — ABNORMAL LOW (ref >39)
LDL Chol Calc (NIH): 141 mg/dL — ABNORMAL HIGH (ref 0–99)
Triglycerides: 116 mg/dL (ref 0–149)
VLDL Cholesterol Cal: 21 mg/dL (ref 5–40)

## 2020-02-24 LAB — CBC
Hematocrit: 42.5 % (ref 34.0–46.6)
Hemoglobin: 13.7 g/dL (ref 11.1–15.9)
MCH: 26.7 pg (ref 26.6–33.0)
MCHC: 32.2 g/dL (ref 31.5–35.7)
MCV: 83 fL (ref 79–97)
Platelets: 285 10*3/uL (ref 150–450)
RBC: 5.13 x10E6/uL (ref 3.77–5.28)
RDW: 14.5 % (ref 11.7–15.4)
WBC: 4 10*3/uL (ref 3.4–10.8)

## 2020-02-24 LAB — HCV AB W REFLEX TO QUANT PCR: HCV Ab: 0.2 s/co ratio (ref 0.0–0.9)

## 2020-02-24 LAB — HCV INTERPRETATION

## 2020-02-24 NOTE — Telephone Encounter (Signed)
Pt is calling back to see which medication zelda will put her on for her dm

## 2020-02-24 NOTE — Telephone Encounter (Signed)
IF Amanda Tran is not covered. Will need to start insulin or injectable that is covered under her insurance. We need to know what her insurance covers as far as injectable or insulin. Please ask pharmacist about this

## 2020-02-25 ENCOUNTER — Telehealth: Payer: Self-pay | Admitting: Nurse Practitioner

## 2020-02-25 DIAGNOSIS — E1165 Type 2 diabetes mellitus with hyperglycemia: Secondary | ICD-10-CM

## 2020-02-25 MED ORDER — TRUE METRIX BLOOD GLUCOSE TEST VI STRP: ORAL_STRIP | 12 refills | Status: DC

## 2020-02-25 NOTE — Telephone Encounter (Addendum)
Pt is calling to let zelda she can afford her [place]  pt used a coupon and paid only 5 dollars for 30 day

## 2020-02-25 NOTE — Telephone Encounter (Signed)
Copied from [place] [id number]. Topic: Quick Communication - Lab Results (Clinic Use ONLY) >> Feb 25, 2020 11:00 AM Lennox Solders wrote: Pt is calling and has viewed her blood work from 02/23/2020 on mychart and would like a nurse to call her to go over the results

## 2020-02-25 NOTE — Telephone Encounter (Signed)
Spoke to patient. Patient was informed on the lab results that were resulted by PCP. Other labs are still under reviewing.

## 2020-02-25 NOTE — Telephone Encounter (Signed)
Rx sent with requested frequency instructions.

## 2020-02-25 NOTE — Telephone Encounter (Signed)
Spoke to patient. She does not want to be on Insulin and would like to try Metformin or increasing her Glimepiride.

## 2020-02-25 NOTE — Telephone Encounter (Signed)
Patient called to ask the nurse or doctor to call regarding her order for test strips.  She stated that the pharmacy told her that the order did not say how many times the patient should be tested and this information is needed for insurance purposes.  Please advise and call patient to confirm.  CB# [phone]

## 2020-02-27 ENCOUNTER — Encounter: Payer: Self-pay | Admitting: Nurse Practitioner

## 2020-02-28 ENCOUNTER — Encounter: Payer: Self-pay | Admitting: Nurse Practitioner

## 2020-02-28 ENCOUNTER — Other Ambulatory Visit: Payer: Self-pay | Admitting: Family Medicine

## 2020-02-28 DIAGNOSIS — E1165 Type 2 diabetes mellitus with hyperglycemia: Secondary | ICD-10-CM

## 2020-02-28 DIAGNOSIS — I10 Essential (primary) hypertension: Secondary | ICD-10-CM

## 2020-02-29 NOTE — Telephone Encounter (Signed)
NOTED. Thank you

## 2020-03-15 ENCOUNTER — Encounter: Payer: Self-pay | Admitting: Pharmacist

## 2020-03-15 ENCOUNTER — Ambulatory Visit: Payer: 59 | Attending: Nurse Practitioner | Admitting: Pharmacist

## 2020-03-15 ENCOUNTER — Other Ambulatory Visit: Payer: Self-pay

## 2020-03-15 DIAGNOSIS — E1165 Type 2 diabetes mellitus with hyperglycemia: Secondary | ICD-10-CM | POA: Diagnosis not present

## 2020-03-15 NOTE — Progress Notes (Signed)
    S:    PCP: [name] No chief complaint on file.  Patient arrives in good spirits. Presents for diabetes evaluation, education, and management. Patient was referred and last seen by Primary Care Provider on 02/23/2020. A1c at that visit was 11.0.   Patient's diabetes was diagnosed 02/26/2019.  Family/Social History:  FHx: kidney failure, HTN, DM Tobacco: former smoker (quit 09/21/2017) Alcohol: occasional use   Insurance coverage/medication affordability: [employer]  Medication adherence reported.   Current diabetes medications include: glimepiride 4 mg daily, Januvia 100 mg daily  Current hypertension medications include: amlodipine 10 mg daily  Current hyperlipidemia medications include: atorvastatin 20 mg daily   Patient denies hypoglycemic events.  Patient reported dietary habits:  - Pt eats 3 meals daily  - Reports that she has eliminated white carbs such as bread, pasta, rice  - Has switched to "zero sugar" candy - Beverages: will rarely have a small amount of OJ, regular soda; drinks mostly water   Patient-reported exercise habits:  - Has been walking 20-30 minutes daily but stopped ~3 days ago d/t weather     Patient denies polyuria. Reports that this was occurring before she started making lifestyle changes at the beginning of this month.  Patient denies neuropathy (nerve pain). Patient reports occasional blurred vision.  Patient reports self foot exams.     O:  Physical Exam   ROS   Lab Results  Component Value Date   HGBA1C 11.0 (A) 02/23/2020   There were no vitals filed for this visit.  Lipid Panel     Component Value Date/Time   CHOL 200 (H) 02/23/2020 1118   TRIG 116 02/23/2020 1118   HDL 38 (L) 02/23/2020 1118   CHOLHDL 5.3 (H) 02/23/2020 1118   CHOLHDL 4.9 03/23/2014 0952   VLDL 11 03/23/2014 0952   LDLCALC 141 (H) 02/23/2020 1118    Home fasting blood sugars: 100s - 150s  2 hour post-meal/random blood sugars: low 110s. 2  outliers of 359, 209 7 day avg: 132 14 day avg: 141    Clinical Atherosclerotic Cardiovascular Disease (ASCVD): No  The 10-year ASCVD risk score Mikey Bussing DC Jr., et al., 2013) is: 6.7%   Values used to calculate the score:     Age: 54 years     Sex: Female     Is Non-Hispanic African American: Yes     Diabetic: Yes     Tobacco smoker: No     Systolic Blood Pressure: 761 mmHg     Is BP treated: Yes     HDL Cholesterol: 38 mg/dL     Total Cholesterol: 200 mg/dL    A/P: Diabetes longstanding currently uncontrolled, however, home sugars look to be improving. Patient is able to verbalize appropriate hypoglycemia management plan. Medication adherence reported. She still may need medication adjustment, however, she wishes to wait until she is seen by the Nutritionist. Given her improvement in home glycemic control, will continue current regimen for now.  -Continued current regimen.  -Extensively discussed pathophysiology of diabetes, recommended lifestyle interventions, dietary effects on blood sugar control -Counseled on s/sx of and management of hypoglycemia -Next A1C anticipated 05/2021.   Written patient instructions provided.  Total time in face to face counseling 30 minutes.   Follow up PCP Clinic Visit in March.  Benard Halsted, PharmD, [place], [place] [phone]

## 2020-03-16 ENCOUNTER — Ambulatory Visit: Payer: 59

## 2020-03-23 ENCOUNTER — Ambulatory Visit: Payer: 59

## 2020-03-25 ENCOUNTER — Other Ambulatory Visit: Payer: Self-pay | Admitting: Nurse Practitioner

## 2020-03-25 DIAGNOSIS — E1165 Type 2 diabetes mellitus with hyperglycemia: Secondary | ICD-10-CM

## 2020-03-27 ENCOUNTER — Other Ambulatory Visit: Payer: Self-pay | Admitting: Nurse Practitioner

## 2020-03-27 DIAGNOSIS — E1165 Type 2 diabetes mellitus with hyperglycemia: Secondary | ICD-10-CM

## 2020-03-30 ENCOUNTER — Ambulatory Visit: Payer: 59

## 2020-04-06 ENCOUNTER — Ambulatory Visit: Payer: 59

## 2020-04-13 ENCOUNTER — Ambulatory Visit: Payer: 59

## 2020-04-20 ENCOUNTER — Encounter: Payer: 59 | Attending: Family Medicine | Admitting: Dietician

## 2020-04-20 ENCOUNTER — Ambulatory Visit: Payer: 59

## 2020-05-13 ENCOUNTER — Other Ambulatory Visit: Payer: Self-pay | Admitting: Nurse Practitioner

## 2020-05-13 DIAGNOSIS — E1165 Type 2 diabetes mellitus with hyperglycemia: Secondary | ICD-10-CM

## 2020-05-23 ENCOUNTER — Encounter: Payer: Self-pay | Admitting: Nurse Practitioner

## 2020-05-23 ENCOUNTER — Ambulatory Visit: Payer: 59 | Attending: Nurse Practitioner | Admitting: Nurse Practitioner

## 2020-05-23 ENCOUNTER — Other Ambulatory Visit: Payer: Self-pay

## 2020-05-23 DIAGNOSIS — E782 Mixed hyperlipidemia: Secondary | ICD-10-CM

## 2020-05-23 DIAGNOSIS — Z1231 Encounter for screening mammogram for malignant neoplasm of breast: Secondary | ICD-10-CM

## 2020-05-23 DIAGNOSIS — E1165 Type 2 diabetes mellitus with hyperglycemia: Secondary | ICD-10-CM | POA: Diagnosis not present

## 2020-05-23 DIAGNOSIS — I10 Essential (primary) hypertension: Secondary | ICD-10-CM | POA: Diagnosis not present

## 2020-05-23 NOTE — Progress Notes (Signed)
Virtual Visit via Telephone Note Due to national recommendations of social distancing due to [place] 19, telehealth visit is felt to be most appropriate for this patient at this time.  I discussed the limitations, risks, security and privacy concerns of performing an evaluation and management service by telephone and the availability of in person appointments. I also discussed with the patient that there may be a patient responsible charge related to this service. The patient expressed understanding and agreed to proceed.    I connected with Amanda Tran on 05/23/20  at  10:30 AM EST  EDT by telephone and verified that I am speaking with the correct person using two identifiers.   Consent I discussed the limitations, risks, security and privacy concerns of performing an evaluation and management service by telephone and the availability of in person appointments. I also discussed with the patient that there may be a patient responsible charge related to this service. The patient expressed understanding and agreed to proceed.   Location of Patient: Private Residence   Location of Provider: [facility] [employer] [facility]    Persons participating in Telemedicine visit: [name] FNP-BC [place]    History of Present Illness: Telemedicine visit for: Follow up She has a past medical history of Anemia, Anxiety, Diabetes, DVT (03/11/2012), Hyperlipidemia, Hypertension, and Vitamin D deficiency.   Essential Hypertension Well controlled with amlodipine 10 mg daily. Denies chest pain, shortness of breath, palpitations, lightheadedness, dizziness, headaches or BLE edema.  BP Readings from Last 3 Encounters:  02/23/20 104/73  10/04/19 123/82  09/22/19 112/78   DM2 Average readings: 70-110s. She is monitoring her blood glucose levels in the mornings. Blood glucose level during televisit 101. Taking januvia 100 mg daily and glimepiride 4 mg daily.  Taking atorvastatin 20 mg daily. LDL not at goal.  Lab Results  Component Value Date   HGBA1C 11.0 (A) 02/23/2020   Lab Results  Component Value Date   LDLCALC 141 (H) 02/23/2020   Past Medical History:  Diagnosis Date  . Anemia   . Anxiety   . Diabetes ([place])   . DVT (deep venous thrombosis) ([place]) 03/11/2012   Left popliteal   . Hyperlipidemia   . Hypertension   . Vitamin D deficiency     Past Surgical History:  Procedure Laterality Date  . [place], [id number], 1996  . TUBAL LIGATION      Family History  Problem Relation Age of Onset  . Hypertension Father   . Diabetes Father   . Kidney failure Mother   . Diabetes Sister   . Colon cancer Neg Hx   . Esophageal cancer Neg Hx   . Stomach cancer Neg Hx   . Rectal cancer Neg Hx     Social History   Socioeconomic History  . Marital status: Divorced    Spouse name: Not on file  . Number of children: 5  . Years of education: Not on file  . Highest education level: High school graduate  Occupational History  . Not on file  Tobacco Use  . Smoking status: Former Smoker    Packs/day: 0.50    Years: 25.00    Pack years: 12.50    Types: Cigarettes    Quit date: 09/21/2017    Years since quitting: 2.6  . Smokeless tobacco: Never Used  Vaping Use  . Vaping Use: Never used  Substance and Sexual Activity  . Alcohol use: Yes  Comment: occasional  . Drug use: No  . Sexual activity: Not Currently    Birth control/protection: Condom, Surgical    Comment: 1st intercourse 55 yo-More than 5 partners-BTL  Other Topics Concern  . Not on file  Social History Narrative  . Not on file   Social Determinants of Health   Financial Resource Strain: Not on file  Food Insecurity: Not on file  Transportation Needs: Not on file  Physical Activity: Not on file  Stress: Not on file  Social Connections: Not on file     Observations/Objective: Awake, alert and oriented x 3   ROS  Assessment and  Plan: Diagnoses and all orders for this visit:  Essential hypertension Continue all antihypertensives as prescribed.  Remember to bring in your blood pressure log with you for your follow up appointment.  DASH/Mediterranean Diets are healthier choices for HTN.    Mixed hyperlipidemia INSTRUCTIONS: Work on a low fat, heart healthy diet and participate in regular aerobic exercise program by working out at least 150 minutes per week; 5 days a week-30 minutes per day. Avoid red meat/beef/steak,  fried foods. junk foods, sodas, sugary drinks, unhealthy snacking, alcohol and smoking.  Drink at least 80 oz of water per day and monitor your carbohydrate intake daily.    Type 2 diabetes mellitus with hyperglycemia, without long-term current use of insulin (HCC) Continue blood sugar control as discussed in office today, low carbohydrate diet, and regular physical exercise as tolerated, 150 minutes per week (30 min each day, 5 days per week, or 50 min 3 days per week). Keep blood sugar logs with fasting goal of 90-130 mg/dl, post prandial (after you eat) less than 180.  For Hypoglycemia: BS <60 and Hyperglycemia BS >400; contact the clinic ASAP. Annual eye exams and foot exams are recommended.   Breast cancer screening by mammogram -     MM 3D SCREEN BREAST BILATERAL; Future     Follow Up Instructions Return in about 2 months (around 07/23/2020).     I discussed the assessment and treatment plan with the patient. The patient was provided an opportunity to ask questions and all were answered. The patient agreed with the plan and demonstrated an understanding of the instructions.   The patient was advised to call back or seek an in-person evaluation if the symptoms worsen or if the condition fails to improve as anticipated.  I provided 14 minutes of non-face-to-face time during this encounter including median intraservice time, reviewing previous notes, labs, imaging, medications and explaining  diagnosis and management.  [name], FNP-BC

## 2020-05-25 ENCOUNTER — Ambulatory Visit: Payer: 59 | Admitting: Dietician

## 2020-05-26 ENCOUNTER — Other Ambulatory Visit: Payer: Self-pay | Admitting: Pharmacist

## 2020-05-26 ENCOUNTER — Encounter: Payer: Self-pay | Admitting: Nurse Practitioner

## 2020-05-26 DIAGNOSIS — E1165 Type 2 diabetes mellitus with hyperglycemia: Secondary | ICD-10-CM

## 2020-05-26 MED ORDER — ONETOUCH VERIO W/DEVICE KIT: PACK | 0 refills | Status: DC

## 2020-05-26 MED ORDER — ONETOUCH DELICA LANCETS 33G MISC: 2 refills | Status: DC

## 2020-05-26 MED ORDER — ONETOUCH VERIO VI STRP: ORAL_STRIP | 2 refills | Status: DC

## 2020-05-29 ENCOUNTER — Ambulatory Visit: Payer: 59 | Attending: Nurse Practitioner

## 2020-05-29 ENCOUNTER — Other Ambulatory Visit: Payer: Self-pay | Admitting: Nurse Practitioner

## 2020-05-29 DIAGNOSIS — E1165 Type 2 diabetes mellitus with hyperglycemia: Secondary | ICD-10-CM

## 2020-05-29 DIAGNOSIS — I10 Essential (primary) hypertension: Secondary | ICD-10-CM

## 2020-05-29 DIAGNOSIS — E785 Hyperlipidemia, unspecified: Secondary | ICD-10-CM

## 2020-05-30 ENCOUNTER — Encounter: Payer: Self-pay | Admitting: Nurse Practitioner

## 2020-05-30 ENCOUNTER — Ambulatory Visit: Payer: 59 | Admitting: Dietician

## 2020-05-30 LAB — CMP14+EGFR
ALT: 32 IU/L (ref 0–32)
AST: 36 IU/L (ref 0–40)
Albumin/Globulin Ratio: 1.4 (ref 1.2–2.2)
Albumin: 4.4 g/dL (ref 3.8–4.9)
Alkaline Phosphatase: 125 IU/L — ABNORMAL HIGH (ref 44–121)
BUN/Creatinine Ratio: 12 (ref 9–23)
BUN: 12 mg/dL (ref 6–24)
Bilirubin Total: 0.2 mg/dL (ref 0.0–1.2)
CO2: 21 mmol/L (ref 20–29)
Calcium: 9.5 mg/dL (ref 8.7–10.2)
Chloride: 104 mmol/L (ref 96–106)
Creatinine, Ser: 1 mg/dL (ref 0.57–1.00)
Globulin, Total: 3.2 g/dL (ref 1.5–4.5)
Glucose: 57 mg/dL — ABNORMAL LOW (ref 65–99)
Potassium: 4.2 mmol/L (ref 3.5–5.2)
Sodium: 140 mmol/L (ref 134–144)
Total Protein: 7.6 g/dL (ref 6.0–8.5)
eGFR: 68 mL/min/{1.73_m2} (ref >59)

## 2020-05-30 LAB — LIPID PANEL
Chol/HDL Ratio: 3.9 ratio (ref 0.0–4.4)
Cholesterol, Total: 143 mg/dL (ref 100–199)
HDL: 37 mg/dL — ABNORMAL LOW (ref >39)
LDL Chol Calc (NIH): 87 mg/dL (ref 0–99)
Triglycerides: 102 mg/dL (ref 0–149)
VLDL Cholesterol Cal: 19 mg/dL (ref 5–40)

## 2020-05-30 LAB — HEMOGLOBIN A1C
Est. average glucose Bld gHb Est-mCnc: 171 mg/dL
Hgb A1c MFr Bld: 7.6 % — ABNORMAL HIGH (ref 4.8–5.6)

## 2020-05-30 NOTE — Telephone Encounter (Signed)
Called patient and discussed all lab results

## 2020-06-20 ENCOUNTER — Ambulatory Visit: Payer: 59 | Attending: Nurse Practitioner | Admitting: Pharmacist

## 2020-06-20 DIAGNOSIS — Z23 Encounter for immunization: Secondary | ICD-10-CM | POA: Diagnosis not present

## 2020-06-20 NOTE — Progress Notes (Signed)
Patient presents for vaccination against zoster per orders of [facility]. Consent given. Counseling provided. No contraindications exists. Vaccine administered without incident.   Benard Halsted, PharmD, [place], [place] [phone]

## 2020-06-27 ENCOUNTER — Other Ambulatory Visit: Payer: Self-pay | Admitting: Nurse Practitioner

## 2020-06-27 ENCOUNTER — Encounter: Payer: Self-pay | Admitting: Nurse Practitioner

## 2020-06-27 DIAGNOSIS — E1142 Type 2 diabetes mellitus with diabetic polyneuropathy: Secondary | ICD-10-CM

## 2020-06-27 MED ORDER — GABAPENTIN 600 MG PO TABS: 300.0000 mg | ORAL_TABLET | Freq: Every day | ORAL | 3 refills | Status: DC

## 2020-07-13 ENCOUNTER — Other Ambulatory Visit: Payer: Self-pay

## 2020-07-13 ENCOUNTER — Encounter: Payer: Self-pay | Admitting: Podiatry

## 2020-07-13 ENCOUNTER — Ambulatory Visit (INDEPENDENT_AMBULATORY_CARE_PROVIDER_SITE_OTHER): Payer: 59 | Admitting: Podiatry

## 2020-07-13 DIAGNOSIS — M722 Plantar fascial fibromatosis: Secondary | ICD-10-CM

## 2020-07-13 MED ORDER — TRIAMCINOLONE ACETONIDE 40 MG/ML IJ SUSP
40.0000 mg | Freq: Once | INTRAMUSCULAR | Status: AC
  Administered 2020-07-13: 40 mg

## 2020-07-13 MED ORDER — GABAPENTIN 600 MG PO TABS: 600.0000 mg | ORAL_TABLET | Freq: Every day | ORAL | 3 refills | Status: DC

## 2020-07-15 NOTE — Progress Notes (Signed)
She presents today for follow-up of her pain to the plantar aspect of the bilateral foot.  Past medical history unchanged.  States that she is taking 300 mg at nighttime.  States that she still wakes with pain and pain.  Objective: Vital signs are stable alert and oriented x3 pulses are palpable.  No change in physical exam.  She has pain on palpation medial calcaneal tubercles bilateral.  Assessment: Peripheral neuropathy.  Plan fasciitis bilateral.  Plan: At this point started her on 600 mg of gabapentin 1 at nighttime.  Dispense 90 with 3 refills follow-up with her in 1 month.  Injected bilateral heels 20 mg Kenalog, grams Marcaine point maximal tenderness.

## 2020-07-20 ENCOUNTER — Ambulatory Visit: Payer: 59 | Admitting: Dietician

## 2020-07-25 ENCOUNTER — Inpatient Hospital Stay: Admission: RE | Admit: 2020-07-25 | Payer: 59 | Source: Ambulatory Visit

## 2020-08-09 ENCOUNTER — Ambulatory Visit
Admission: RE | Admit: 2020-08-09 | Discharge: 2020-08-09 | Disposition: A | Discharge status: Home / Self Care | Payer: 59 | Source: Ambulatory Visit | Attending: Nurse Practitioner | Admitting: Nurse Practitioner

## 2020-08-09 ENCOUNTER — Other Ambulatory Visit: Payer: Self-pay

## 2020-08-09 DIAGNOSIS — Z1231 Encounter for screening mammogram for malignant neoplasm of breast: Secondary | ICD-10-CM

## 2020-08-15 ENCOUNTER — Ambulatory Visit: Payer: 59 | Admitting: Podiatry

## 2020-08-22 ENCOUNTER — Ambulatory Visit: Payer: 59 | Admitting: Pharmacist

## 2020-08-27 ENCOUNTER — Encounter: Payer: Self-pay | Admitting: Nurse Practitioner

## 2020-08-27 ENCOUNTER — Other Ambulatory Visit: Payer: Self-pay | Admitting: Nurse Practitioner

## 2020-08-27 NOTE — Telephone Encounter (Signed)
Requested medication (s) are due for refill today: yes  Requested medication (s) are on the active medication list: expired  Last refill:  02/23/20  Future visit scheduled: yes  Notes to clinic:  med expired 05/23/20   Requested Prescriptions  Pending Prescriptions Disp Refills   JANUVIA 100 MG tablet [Pharmacy Med Name: Januvia 100 MG Oral Tablet] 30 tablet 0    Sig: Take 1 tablet by mouth once daily      Endocrinology:  Diabetes - DPP-4 Inhibitors Passed - 08/27/2020  6:30 AM      Passed - HBA1C is between 0 and 7.9 and within 180 days    Hgb A1c MFr Bld  Date Value Ref Range Status  05/29/2020 7.6 (H) 4.8 - 5.6 % Final    Comment:             Prediabetes: 5.7 - 6.4          Diabetes: >6.4          Glycemic control for adults with diabetes: <7.0           Passed - Cr in normal range and within 360 days    Creat  Date Value Ref Range Status  12/21/2014 0.78 0.50 - 1.10 mg/dL Final   Creatinine, Ser  Date Value Ref Range Status  05/29/2020 1.00 0.57 - 1.00 mg/dL Final          Passed - Valid encounter within last 6 months    Recent Outpatient Visits           2 months ago Need for zoster vaccination   [place], Jarome Matin, RPH-CPP   3 months ago Essential hypertension   [place], [place] W, NP   5 months ago Type 2 diabetes mellitus with hyperglycemia, without long-term current use of insulin [facility])   [place], Jarome Matin, RPH-CPP   6 months ago Type 2 diabetes mellitus with hyperglycemia, without long-term current use of insulin [facility])   [place], Vernia Buff, NP   1 year ago Essential hypertension   [place], Vernia Buff, NP       Future Appointments             In 5 days [name], NP [place]

## 2020-08-29 ENCOUNTER — Other Ambulatory Visit: Payer: Self-pay

## 2020-08-29 ENCOUNTER — Encounter: Payer: Self-pay | Admitting: Podiatry

## 2020-08-29 ENCOUNTER — Ambulatory Visit (INDEPENDENT_AMBULATORY_CARE_PROVIDER_SITE_OTHER): Payer: 59 | Admitting: Podiatry

## 2020-08-29 DIAGNOSIS — M722 Plantar fascial fibromatosis: Secondary | ICD-10-CM | POA: Diagnosis not present

## 2020-08-29 DIAGNOSIS — E1142 Type 2 diabetes mellitus with diabetic polyneuropathy: Secondary | ICD-10-CM

## 2020-08-30 NOTE — Progress Notes (Signed)
She presents today for follow-up of her Planter fasciitis states that her feet have been great she does not even need the gabapentin anymore states that after the injection she was feeling 100%.  And has not had pain since.  Objective: Vital signs are stable alert oriented x3 she has no pain on palpation medial calcaneal tubercles pulses remain strong and palpable.  No edema erythema cellulitis drainage or odor.  Assessment: Well-healing Planter fasciitis.  Plan: Follow-up with me with any reoccurrence.

## 2020-09-01 ENCOUNTER — Encounter: Payer: Self-pay | Admitting: Nurse Practitioner

## 2020-09-01 ENCOUNTER — Other Ambulatory Visit: Payer: Self-pay

## 2020-09-01 ENCOUNTER — Ambulatory Visit: Payer: 59 | Attending: Nurse Practitioner | Admitting: Nurse Practitioner

## 2020-09-01 VITALS — BP 129/86 | HR 98 | Ht 61.0 in | Wt 209.0 lb

## 2020-09-01 DIAGNOSIS — I10 Essential (primary) hypertension: Secondary | ICD-10-CM | POA: Diagnosis not present

## 2020-09-01 DIAGNOSIS — E1165 Type 2 diabetes mellitus with hyperglycemia: Secondary | ICD-10-CM

## 2020-09-01 DIAGNOSIS — D649 Anemia, unspecified: Secondary | ICD-10-CM | POA: Diagnosis not present

## 2020-09-01 DIAGNOSIS — E782 Mixed hyperlipidemia: Secondary | ICD-10-CM

## 2020-09-01 DIAGNOSIS — R232 Flushing: Secondary | ICD-10-CM

## 2020-09-01 DIAGNOSIS — J3089 Other allergic rhinitis: Secondary | ICD-10-CM

## 2020-09-01 LAB — POCT GLYCOSYLATED HEMOGLOBIN (HGB A1C): HbA1c, POC (controlled diabetic range): 7.2 % — AB (ref 0.0–7.0)

## 2020-09-01 LAB — GLUCOSE, POCT (MANUAL RESULT ENTRY): POC Glucose: 91 mg/dl (ref 70–99)

## 2020-09-01 MED ORDER — AMLODIPINE BESYLATE 10 MG PO TABS: 10.0000 mg | ORAL_TABLET | Freq: Every day | ORAL | 1 refills | Status: DC

## 2020-09-01 MED ORDER — PAROXETINE HCL 10 MG PO TABS: 10.0000 mg | ORAL_TABLET | Freq: Every day | ORAL | 1 refills | Status: DC

## 2020-09-01 MED ORDER — GLIMEPIRIDE 4 MG PO TABS: 4.0000 mg | ORAL_TABLET | Freq: Every day | ORAL | 1 refills | Status: DC

## 2020-09-01 MED ORDER — ATORVASTATIN CALCIUM 20 MG PO TABS: 20.0000 mg | ORAL_TABLET | Freq: Every day | ORAL | 2 refills | Status: DC

## 2020-09-01 MED ORDER — LORATADINE 10 MG PO TABS: 10.0000 mg | ORAL_TABLET | Freq: Every day | ORAL | 3 refills | Status: DC

## 2020-09-01 MED ORDER — SITAGLIPTIN PHOSPHATE 100 MG PO TABS: 100.0000 mg | ORAL_TABLET | Freq: Every day | ORAL | 1 refills | Status: DC

## 2020-09-01 NOTE — Progress Notes (Signed)
Assessment & Plan:  [place] was seen today for diabetes.  Diagnoses and all orders for this visit:  Type 2 diabetes mellitus with hyperglycemia, without long-term current use of insulin (HCC) -     POCT glucose (manual entry) -     POCT glycosylated hemoglobin (Hb A1C) -     CMP14+EGFR -     glimepiride (AMARYL) 4 MG tablet; Take 1 tablet (4 mg total) by mouth daily with breakfast. PASS PROGRAM -     sitaGLIPtin (JANUVIA) 100 MG tablet; Take 1 tablet (100 mg total) by mouth daily. NEEDS PASS -     Microalbumin / creatinine urine ratio Continue blood sugar control as discussed in office today, low carbohydrate diet, and regular physical exercise as tolerated, 150 minutes per week (30 min each day, 5 days per week, or 50 min 3 days per week). Keep blood sugar logs with fasting goal of 90-130 mg/dl, post prandial (after you eat) less than 180.  For Hypoglycemia: BS <60 and Hyperglycemia BS >400; contact the clinic ASAP. Annual eye exams and foot exams are recommended.   Mixed hyperlipidemia -     atorvastatin (LIPITOR) 20 MG tablet; Take 1 tablet (20 mg total) by mouth daily. NEEDS PASS INSTRUCTIONS: Work on a low fat, heart healthy diet and participate in regular aerobic exercise program by working out at least 150 minutes per week; 5 days a week-30 minutes per day. Avoid red meat/beef/steak,  fried foods. junk foods, sodas, sugary drinks, unhealthy snacking, alcohol and smoking.  Drink at least 80 oz of water per day and monitor your carbohydrate intake daily.    Essential hypertension -     amLODipine (NORVASC) 10 MG tablet; Take 1 tablet (10 mg total) by mouth daily. NEEDS PASS Continue all antihypertensives as prescribed.  Remember to bring in your blood pressure log with you for your follow up appointment.  DASH/Mediterranean Diets are healthier choices for HTN.    Anemia, unspecified type -     CBC  Hot flashes -     PARoxetine (PAXIL) 10 MG tablet; Take 1 tablet (10 mg total)  by mouth daily. NEEDS PASS  Environmental and seasonal allergies -     loratadine (CLARITIN) 10 MG tablet; Take 1 tablet (10 mg total) by mouth daily.   Patient has been counseled on age-appropriate routine health concerns for screening and prevention. These are reviewed and up-to-date. Referrals have been placed accordingly. Immunizations are up-to-date or declined.    Subjective:   Chief Complaint  Patient presents with   Diabetes   Diabetes Pertinent negatives for hypoglycemia include no dizziness, headaches or seizures. Pertinent negatives for diabetes include no blurred vision, no chest pain and no weight loss.  Amanda Tran 55 y.o. female presents to office today for follow up. She has a past medical history of Anemia, Anxiety, Diabetes ([place]), DVT (deep venous thrombosis) ([place]) (03/11/2012), Hyperlipidemia, Hypertension, and Vitamin D deficiency.   Requesting allergy medications due to allergy symptoms.   DM 2 Average readings 140-160s postprandial. Improved today. Taking Januvia 100 mg daily and glimepiride 4 mg daily.  LDL not at goal. Taking atorvastatin 20 mg daily.  Lab Results  Component Value Date   HGBA1C 7.2 (A) 09/01/2020   Lab Results  Component Value Date   LDLCALC 87 05/29/2020    Blood pressure is well controlled.  BP Readings from Last 3 Encounters:  09/01/20 129/86  02/23/20 104/73  10/04/19 123/82     Experiencing daily hot flashes.  Requesting medication for symptoms.    Review of Systems  Constitutional:  Negative for fever, malaise/fatigue and weight loss.  HENT:  Positive for congestion. Negative for nosebleeds.        Rhinorrhea  Eyes: Negative.  Negative for blurred vision, double vision and photophobia.  Respiratory: Negative.  Negative for cough and shortness of breath.   Cardiovascular: Negative.  Negative for chest pain, palpitations and leg swelling.  Gastrointestinal: Negative.  Negative for heartburn, nausea and vomiting.   Musculoskeletal: Negative.  Negative for myalgias.  Neurological: Negative.  Negative for dizziness, focal weakness, seizures and headaches.  Endo/Heme/Allergies:  Positive for environmental allergies.  Psychiatric/Behavioral: Negative.  Negative for suicidal ideas.    Past Medical History:  Diagnosis Date   Anemia    Anxiety    Diabetes (HCC)    DVT (deep venous thrombosis) (HCC) 03/11/2012   Left popliteal    Hyperlipidemia    Hypertension    Vitamin D deficiency     Past Surgical History:  Procedure Laterality Date   CESAREAN SECTION     1990, 1993.1994, 1996   TUBAL LIGATION      Family History  Problem Relation Age of Onset   Hypertension Father    Diabetes Father    Kidney failure Mother    Diabetes Sister    Colon cancer Neg Hx    Esophageal cancer Neg Hx    Stomach cancer Neg Hx    Rectal cancer Neg Hx     Social History Reviewed with no changes to be made today.   Outpatient Medications Prior to Visit  Medication Sig Dispense Refill   Blood Glucose Monitoring Suppl (ONETOUCH VERIO) w/Device KIT Use to check blood sugar once daily. E11.65 1 kit 0   glucose blood (ONETOUCH VERIO) test strip Use to check blood sugar once daily. E11.65 100 each 2   OneTouch Delica Lancets 33G MISC Use to check blood sugar once daily. E11.65 100 each 2   glimepiride (AMARYL) 4 MG tablet Take 1 tablet by mouth once daily with breakfast 90 tablet 1   sitaGLIPtin (JANUVIA) 100 MG tablet Take 1 tablet (100 mg total) by mouth daily. 90 tablet 1   ferrous sulfate 325 (65 FE) MG tablet Take 1 tablet (325 mg total) by mouth 2 (two) times daily with a meal. (Patient not taking: Reported on 09/01/2020) 60 tablet 6   gabapentin (NEURONTIN) 600 MG tablet Take 1 tablet (600 mg total) by mouth at bedtime. (Patient not taking: Reported on 09/01/2020) 90 tablet 3   amLODipine (NORVASC) 10 MG tablet Take 1 tablet (10 mg total) by mouth daily. 90 tablet 1   atorvastatin (LIPITOR) 20 MG tablet Take 1  tablet (20 mg total) by mouth daily. 90 tablet 2   escitalopram (LEXAPRO) 5 MG tablet Take 1 tablet (5 mg total) by mouth daily. (Patient not taking: Reported on 02/23/2020) 30 tablet 2   No facility-administered medications prior to visit.    No Known Allergies     Objective:    BP 129/86   Pulse 98   Ht 5\' 1"  (1.549 m)   Wt 209 lb (94.8 kg)   SpO2 100%   BMI 39.49 kg/m  Wt Readings from Last 3 Encounters:  09/01/20 209 lb (94.8 kg)  02/23/20 209 lb 6.4 oz (95 kg)  10/04/19 201 lb (91.2 kg)    Physical Exam Vitals and nursing note reviewed.  Constitutional:      Appearance: She is well-developed.  HENT:  Head: Normocephalic and atraumatic.  Cardiovascular:     Rate and Rhythm: Normal rate and regular rhythm.     Heart sounds: Normal heart sounds. No murmur heard.   No friction rub. No gallop.  Pulmonary:     Effort: Pulmonary effort is normal. No tachypnea or respiratory distress.     Breath sounds: Normal breath sounds. No decreased breath sounds, wheezing, rhonchi or rales.  Chest:     Chest wall: No tenderness.  Abdominal:     General: Bowel sounds are normal.     Palpations: Abdomen is soft.  Musculoskeletal:        General: Normal range of motion.     Cervical back: Normal range of motion.  Skin:    General: Skin is warm and dry.  Neurological:     Mental Status: She is alert and oriented to person, place, and time.     Coordination: Coordination normal.  Psychiatric:        Behavior: Behavior normal. Behavior is cooperative.        Thought Content: Thought content normal.        Judgment: Judgment normal.         Patient has been counseled extensively about nutrition and exercise as well as the importance of adherence with medications and regular follow-up. The patient was given clear instructions to go to ER or return to medical center if symptoms don't improve, worsen or new problems develop. The patient verbalized understanding.   Follow-up:  Return in about 3 months (around 12/02/2020).   [name], FNP-BC [facility] [place], [place]   09/01/2020, 4:59 PM

## 2020-09-02 ENCOUNTER — Encounter: Payer: Self-pay | Admitting: Nurse Practitioner

## 2020-09-02 LAB — CMP14+EGFR
ALT: 35 IU/L — ABNORMAL HIGH (ref 0–32)
AST: 33 IU/L (ref 0–40)
Albumin/Globulin Ratio: 1.5 (ref 1.2–2.2)
Albumin: 4.8 g/dL (ref 3.8–4.9)
Alkaline Phosphatase: 136 IU/L — ABNORMAL HIGH (ref 44–121)
BUN/Creatinine Ratio: 15 (ref 9–23)
BUN: 16 mg/dL (ref 6–24)
Bilirubin Total: 0.2 mg/dL (ref 0.0–1.2)
CO2: 23 mmol/L (ref 20–29)
Calcium: 10.1 mg/dL (ref 8.7–10.2)
Chloride: 100 mmol/L (ref 96–106)
Creatinine, Ser: 1.04 mg/dL — ABNORMAL HIGH (ref 0.57–1.00)
Globulin, Total: 3.2 g/dL (ref 1.5–4.5)
Glucose: 81 mg/dL (ref 65–99)
Potassium: 3.9 mmol/L (ref 3.5–5.2)
Sodium: 139 mmol/L (ref 134–144)
Total Protein: 8 g/dL (ref 6.0–8.5)
eGFR: 64 mL/min/{1.73_m2} (ref >59)

## 2020-09-02 LAB — CBC
Hematocrit: 42.1 % (ref 34.0–46.6)
Hemoglobin: 13.7 g/dL (ref 11.1–15.9)
MCH: 26 pg — ABNORMAL LOW (ref 26.6–33.0)
MCHC: 32.5 g/dL (ref 31.5–35.7)
MCV: 80 fL (ref 79–97)
Platelets: 334 10*3/uL (ref 150–450)
RBC: 5.26 x10E6/uL (ref 3.77–5.28)
RDW: 14.4 % (ref 11.7–15.4)
WBC: 6.6 10*3/uL (ref 3.4–10.8)

## 2020-09-02 LAB — MICROALBUMIN / CREATININE URINE RATIO
Creatinine, Urine: 29.8 mg/dL
Microalb/Creat Ratio: 11 mg/g creat (ref 0–29)
Microalbumin, Urine: 3.3 ug/mL

## 2020-09-07 ENCOUNTER — Other Ambulatory Visit: Payer: Self-pay

## 2020-09-07 ENCOUNTER — Ambulatory Visit
Admission: RE | Admit: 2020-09-07 | Discharge: 2020-09-07 | Disposition: A | Discharge status: Home / Self Care | Payer: 59 | Source: Ambulatory Visit | Attending: Nurse Practitioner | Admitting: Nurse Practitioner

## 2020-09-07 DIAGNOSIS — Z1231 Encounter for screening mammogram for malignant neoplasm of breast: Secondary | ICD-10-CM

## 2020-09-21 ENCOUNTER — Other Ambulatory Visit: Payer: Self-pay

## 2020-09-21 ENCOUNTER — Ambulatory Visit (INDEPENDENT_AMBULATORY_CARE_PROVIDER_SITE_OTHER): Payer: 59 | Admitting: Podiatry

## 2020-09-21 DIAGNOSIS — M722 Plantar fascial fibromatosis: Secondary | ICD-10-CM

## 2020-09-21 MED ORDER — TRIAMCINOLONE ACETONIDE 40 MG/ML IJ SUSP
20.0000 mg | Freq: Once | INTRAMUSCULAR | Status: AC
  Administered 2020-09-21: 20 mg

## 2020-09-21 MED ORDER — MELOXICAM 15 MG PO TABS: 15.0000 mg | ORAL_TABLET | Freq: Every day | ORAL | 3 refills | Status: DC

## 2020-09-21 NOTE — Progress Notes (Signed)
She presents today states that the injection helped previously but it has come back.  He states that I think he may have just overdone it.  Objective: Vital signs are stable alert oriented x3.  Pulses are palpable.  She has pain on palpation medial calcaneal tubercle.  No erythema edema cellulitis drainage or odor.  Assessment: Pain in limb secondary to chronic proximal Planter fasciitis.  Plan: Discussed etiology pathology and surgical therapies injected the area again today started on meloxicam 50 mg 1 p.o. daily.  She will follow-up with me and 1 month at which time we may need to consider MRI if not improved.

## 2020-10-11 ENCOUNTER — Ambulatory Visit: Payer: 59

## 2020-10-12 ENCOUNTER — Other Ambulatory Visit (HOSPITAL_BASED_OUTPATIENT_CLINIC_OR_DEPARTMENT_OTHER): Payer: Self-pay

## 2020-10-12 MED ORDER — PFIZER-BIONT COVID-19 VAC-TRIS 30 MCG/0.3ML IM SUSP
INTRAMUSCULAR | 0 refills | Status: DC
  Filled 2020-10-12: qty 0.3, 1d supply, fill #0

## 2020-10-18 ENCOUNTER — Ambulatory Visit: Payer: 59 | Attending: Internal Medicine

## 2020-10-18 DIAGNOSIS — Z23 Encounter for immunization: Secondary | ICD-10-CM

## 2020-10-18 NOTE — Progress Notes (Signed)
   Covid-19 Vaccination Clinic  Name:  CHEA WOOLWORTH    MRN: [id number] DOB: Feb 02, 1966  10/18/2020  Ms. Hermance was observed post Covid-19 immunization for 15 minutes without incident. She was provided with Vaccine Information Sheet and instruction to access the V-Safe system. Vaccinated by Sherrie Sport  Ms. Porche was instructed to call 911 with any severe reactions post vaccine: Difficulty breathing  Swelling of face and throat  A fast heartbeat  A bad rash all over body  Dizziness and weakness   Immunizations Administered     Name Date Dose VIS Date Route   PFIZER Comrnaty(Gray TOP) Covid-19 Vaccine 10/18/2020  2:26 PM 0.3 mL 03/02/2020 Intramuscular   Manufacturer: [place]   Lot: [id number]   [place]: [phone]

## 2020-10-24 ENCOUNTER — Other Ambulatory Visit (HOSPITAL_BASED_OUTPATIENT_CLINIC_OR_DEPARTMENT_OTHER): Payer: Self-pay

## 2020-10-24 MED ORDER — COVID-19 MRNA VAC-TRIS(PFIZER) 30 MCG/0.3ML IM SUSP
INTRAMUSCULAR | 0 refills | Status: DC
  Filled 2020-10-24: qty 0.3, 1d supply, fill #0

## 2020-11-02 ENCOUNTER — Encounter: Payer: Self-pay | Admitting: Podiatry

## 2020-11-02 ENCOUNTER — Ambulatory Visit (INDEPENDENT_AMBULATORY_CARE_PROVIDER_SITE_OTHER): Payer: 59 | Admitting: Podiatry

## 2020-11-02 ENCOUNTER — Other Ambulatory Visit: Payer: Self-pay

## 2020-11-02 DIAGNOSIS — M722 Plantar fascial fibromatosis: Secondary | ICD-10-CM | POA: Diagnosis not present

## 2020-11-02 DIAGNOSIS — S90221A Contusion of right lesser toe(s) with damage to nail, initial encounter: Secondary | ICD-10-CM

## 2020-11-02 MED ORDER — TRIAMCINOLONE ACETONIDE 40 MG/ML IJ SUSP
20.0000 mg | Freq: Once | INTRAMUSCULAR | Status: AC
  Administered 2020-11-02: 20 mg

## 2020-11-04 NOTE — Progress Notes (Signed)
She presents today states that some days are still really bad as she refers to the Planter fasciitis of the left foot.  She tore piece of the toenail on her great toe right foot off.  Objective: Vital signs are stable alert and oriented x3.  Hallux nail is broken but is not bleeding does not demonstrate any type of infection.  She still has pain on palpation medial calcaneal tubercle of the left heel.  Assessment: Chronic Planter fasciitis left heel.  Plan: I went ahead and injected the left heel again today with [place] Kenalog and local anesthetic.  Tolerated procedure well without complications.  Follow-up with her as needed.

## 2020-12-04 ENCOUNTER — Other Ambulatory Visit: Payer: Self-pay

## 2020-12-04 ENCOUNTER — Ambulatory Visit: Payer: 59 | Attending: Nurse Practitioner | Admitting: Nurse Practitioner

## 2020-12-04 ENCOUNTER — Encounter: Payer: Self-pay | Admitting: Nurse Practitioner

## 2020-12-04 VITALS — BP 144/92 | HR 101 | Ht 61.0 in | Wt 212.4 lb

## 2020-12-04 DIAGNOSIS — I10 Essential (primary) hypertension: Secondary | ICD-10-CM | POA: Diagnosis not present

## 2020-12-04 DIAGNOSIS — J3089 Other allergic rhinitis: Secondary | ICD-10-CM | POA: Diagnosis not present

## 2020-12-04 DIAGNOSIS — E1165 Type 2 diabetes mellitus with hyperglycemia: Secondary | ICD-10-CM

## 2020-12-04 DIAGNOSIS — E782 Mixed hyperlipidemia: Secondary | ICD-10-CM

## 2020-12-04 LAB — POCT GLYCOSYLATED HEMOGLOBIN (HGB A1C): Hemoglobin A1C: 7.3 % — AB (ref 4.0–5.6)

## 2020-12-04 LAB — GLUCOSE, POCT (MANUAL RESULT ENTRY): POC Glucose: 63 mg/dl — AB (ref 70–99)

## 2020-12-04 MED ORDER — LORATADINE 10 MG PO TABS: 10.0000 mg | ORAL_TABLET | Freq: Every day | ORAL | 3 refills | Status: DC

## 2020-12-04 MED ORDER — LISINOPRIL 5 MG PO TABS: 5.0000 mg | ORAL_TABLET | Freq: Every day | ORAL | 3 refills | Status: DC

## 2020-12-04 MED ORDER — AMLODIPINE BESYLATE 10 MG PO TABS: 10.0000 mg | ORAL_TABLET | Freq: Every day | ORAL | 1 refills | Status: DC

## 2020-12-04 MED ORDER — ATORVASTATIN CALCIUM 20 MG PO TABS: 20.0000 mg | ORAL_TABLET | Freq: Every day | ORAL | 2 refills | Status: DC

## 2020-12-04 MED ORDER — SITAGLIPTIN PHOSPHATE 100 MG PO TABS: 100.0000 mg | ORAL_TABLET | Freq: Every day | ORAL | 1 refills | Status: DC

## 2020-12-04 MED ORDER — GLIMEPIRIDE 4 MG PO TABS: 2.0000 mg | ORAL_TABLET | Freq: Two times a day (BID) | ORAL | 1 refills | Status: DC

## 2020-12-04 NOTE — Progress Notes (Signed)
Assessment & Plan:  Amanda Tran was seen today for diabetes.  Diagnoses and all orders for this visit:  Essential hypertension -     amLODipine (NORVASC) 10 MG tablet; Take 1 tablet (10 mg total) by mouth daily. NEEDS PASS Continue all antihypertensives as prescribed.  Remember to bring in your blood pressure log with you for your follow up appointment.  DASH/Mediterranean Diets are healthier choices for HTN.    Type 2 diabetes mellitus with hyperglycemia, without long-term current use of insulin (HCC) -     POCT glucose (manual entry) -     POCT glycosylated hemoglobin (Hb A1C) -     CMP14+EGFR -     glimepiride (AMARYL) 4 MG tablet; Take 0.5 tablets (2 mg total) by mouth 2 (two) times daily. PASS PROGRAM -     sitaGLIPtin (JANUVIA) 100 MG tablet; Take 1 tablet (100 mg total) by mouth daily. NEEDS PASS Continue blood sugar control as discussed in office today, low carbohydrate diet, and regular physical exercise as tolerated, 150 minutes per week (30 min each day, 5 days per week, or 50 min 3 days per week). Keep blood sugar logs with fasting goal of 90-130 mg/dl, post prandial (after you eat) less than 180.  For Hypoglycemia: BS <60 and Hyperglycemia BS >400; contact the clinic ASAP. Annual eye exams and foot exams are recommended.   Mixed hyperlipidemia -     atorvastatin (LIPITOR) 20 MG tablet; Take 1 tablet (20 mg total) by mouth daily. NEEDS PASS INSTRUCTIONS: Work on a low fat, heart healthy diet and participate in regular aerobic exercise program by working out at least 150 minutes per week; 5 days a week-30 minutes per day. Avoid red meat/beef/steak,  fried foods. junk foods, sodas, sugary drinks, unhealthy snacking, alcohol and smoking.  Drink at least 80 oz of water per day and monitor your carbohydrate intake daily.    Environmental and seasonal allergies -     loratadine (CLARITIN) 10 MG tablet; Take 1 tablet (10 mg total) by mouth daily.   Patient has been counseled on  age-appropriate routine health concerns for screening and prevention. These are reviewed and up-to-date. Referrals have been placed accordingly. Immunizations are up-to-date or declined.    Subjective:   Chief Complaint  Patient presents with   Diabetes   HPI Amanda Tran 55 y.o. female presents to office today for follow up to HTN, HPL and DM.   Hypertension Blood pressure is not well controlled. She endorses adherence taking amlodipine 10 mg daily as prescribed. Denies chest pain, shortness of breath, palpitations, lightheadedness, dizziness, headaches or BLE edema.  Adding renal dose lisinopril today  BP Readings from Last 3 Encounters:  12/04/20 (!) 144/92  09/01/20 129/86  02/23/20 104/73     Hyperlipidemia Taking atorvastatin 20 mg daily as prescribed. LDL not at goal.  Lab Results  Component Value Date   LDLCALC 87 05/29/2020     DM Not optimal. She endorses symptomatic hypoglycemia occurring often/weekly. We have adjusted her glimepiride today to 2 tablets in the morning and 2 tablets in the evening along with janumet 100 mg daily. She is taking gabapentin for plantar fasciitis.  Lab Results  Component Value Date   HGBA1C 7.3 (A) 12/04/2020    Lab Results  Component Value Date   HGBA1C 7.2 (A) 09/01/2020     Review of Systems  Constitutional:  Negative for fever, malaise/fatigue and weight loss.  HENT: Negative.  Negative for nosebleeds.   Eyes: Negative.  Negative for blurred vision, double vision and photophobia.  Respiratory: Negative.  Negative for cough and shortness of breath.   Cardiovascular: Negative.  Negative for chest pain, palpitations and leg swelling.  Gastrointestinal: Negative.  Negative for heartburn, nausea and vomiting.  Musculoskeletal: Negative.  Negative for myalgias.  Neurological: Negative.  Negative for dizziness, focal weakness, seizures and headaches.  Endo/Heme/Allergies:  Positive for environmental allergies.   Psychiatric/Behavioral: Negative.  Negative for suicidal ideas.    Past Medical History:  Diagnosis Date   Anemia    Anxiety    Diabetes ([place])    DVT (deep venous thrombosis) ([place]) 03/11/2012   Left popliteal    Hyperlipidemia    Hypertension    Vitamin D deficiency     Past Surgical History:  Procedure Laterality Date   CESAREAN SECTION     1990, 1993.1994, 1996   TUBAL LIGATION      Family History  Problem Relation Age of Onset   Hypertension Father    Diabetes Father    Kidney failure Mother    Diabetes Sister    Colon cancer Neg Hx    Esophageal cancer Neg Hx    Stomach cancer Neg Hx    Rectal cancer Neg Hx     Social History Reviewed with no changes to be made today.   Outpatient Medications Prior to Visit  Medication Sig Dispense Refill   Blood Glucose Monitoring Suppl (ONETOUCH VERIO) w/Device KIT Use to check blood sugar once daily. E11.65 1 kit 0   ferrous sulfate 325 (65 FE) MG tablet Take 1 tablet (325 mg total) by mouth 2 (two) times daily with a meal. 60 tablet 6   gabapentin (NEURONTIN) 600 MG tablet Take 1 tablet (600 mg total) by mouth at bedtime. 90 tablet 3   glucose blood (ONETOUCH VERIO) test strip Use to check blood sugar once daily. E11.65 100 each 2   meloxicam (MOBIC) 15 MG tablet Take 1 tablet (15 mg total) by mouth daily. 30 tablet 3   OneTouch Delica Lancets 77O MISC Use to check blood sugar once daily. E11.65 100 each 2   COVID-19 mRNA Vac-TriS, Pfizer, SUSP injection Inject into the muscle. (Patient not taking: Reported on 12/04/2020) 0.3 mL 0   PARoxetine (PAXIL) 10 MG tablet Take 1 tablet (10 mg total) by mouth daily. NEEDS PASS 30 tablet 1   amLODipine (NORVASC) 10 MG tablet Take 1 tablet (10 mg total) by mouth daily. NEEDS PASS 90 tablet 1   atorvastatin (LIPITOR) 20 MG tablet Take 1 tablet (20 mg total) by mouth daily. NEEDS PASS 90 tablet 2   glimepiride (AMARYL) 4 MG tablet Take 1 tablet (4 mg total) by mouth daily with breakfast.  PASS PROGRAM 90 tablet 1   loratadine (CLARITIN) 10 MG tablet Take 1 tablet (10 mg total) by mouth daily. 90 tablet 3   sitaGLIPtin (JANUVIA) 100 MG tablet Take 1 tablet (100 mg total) by mouth daily. NEEDS PASS 90 tablet 1   No facility-administered medications prior to visit.    No Known Allergies     Objective:    BP (!) 144/92   Pulse (!) 101   Ht $R'5\' 1"'fN$  (1.549 m)   Wt 212 lb 6 oz (96.3 kg)   SpO2 99%   BMI 40.13 kg/m  Wt Readings from Last 3 Encounters:  12/04/20 212 lb 6 oz (96.3 kg)  09/01/20 209 lb (94.8 kg)  02/23/20 209 lb 6.4 oz (95 kg)    Physical Exam Vitals  and nursing note reviewed.  Constitutional:      Appearance: She is well-developed.  HENT:     Head: Normocephalic and atraumatic.  Cardiovascular:     Rate and Rhythm: Regular rhythm. Tachycardia present.     Heart sounds: Normal heart sounds. No murmur heard.   No friction rub. No gallop.  Pulmonary:     Effort: Pulmonary effort is normal. No tachypnea or respiratory distress.     Breath sounds: Normal breath sounds. No decreased breath sounds, wheezing, rhonchi or rales.  Chest:     Chest wall: No tenderness.  Abdominal:     General: Bowel sounds are normal.     Palpations: Abdomen is soft.  Musculoskeletal:        General: Normal range of motion.     Cervical back: Normal range of motion.  Skin:    General: Skin is warm and dry.  Neurological:     Mental Status: She is alert and oriented to person, place, and time.     Coordination: Coordination normal.  Psychiatric:        Behavior: Behavior normal. Behavior is cooperative.        Thought Content: Thought content normal.        Judgment: Judgment normal.         Patient has been counseled extensively about nutrition and exercise as well as the importance of adherence with medications and regular follow-up. The patient was given clear instructions to go to ER or return to medical center if symptoms don't improve, worsen or new problems  develop. The patient verbalized understanding.   Follow-up: Return in about 3 months (around 03/05/2021).   [name], FNP-BC [facility] [place], [place]   12/04/2020, 8:11 PM

## 2020-12-05 LAB — CMP14+EGFR
ALT: 25 IU/L (ref 0–32)
AST: 31 IU/L (ref 0–40)
Albumin/Globulin Ratio: 1.5 (ref 1.2–2.2)
Albumin: 4.8 g/dL (ref 3.8–4.9)
Alkaline Phosphatase: 123 IU/L — ABNORMAL HIGH (ref 44–121)
BUN/Creatinine Ratio: 9 (ref 9–23)
BUN: 8 mg/dL (ref 6–24)
Bilirubin Total: 0.3 mg/dL (ref 0.0–1.2)
CO2: 24 mmol/L (ref 20–29)
Calcium: 9.8 mg/dL (ref 8.7–10.2)
Chloride: 101 mmol/L (ref 96–106)
Creatinine, Ser: 0.92 mg/dL (ref 0.57–1.00)
Globulin, Total: 3.3 g/dL (ref 1.5–4.5)
Glucose: 77 mg/dL (ref 65–99)
Potassium: 3.9 mmol/L (ref 3.5–5.2)
Sodium: 142 mmol/L (ref 134–144)
Total Protein: 8.1 g/dL (ref 6.0–8.5)
eGFR: 74 mL/min/{1.73_m2} (ref >59)

## 2020-12-07 ENCOUNTER — Encounter: Payer: Self-pay | Admitting: Nurse Practitioner

## 2020-12-29 ENCOUNTER — Encounter (HOSPITAL_BASED_OUTPATIENT_CLINIC_OR_DEPARTMENT_OTHER): Payer: Self-pay | Admitting: Emergency Medicine

## 2020-12-29 ENCOUNTER — Other Ambulatory Visit: Payer: Self-pay

## 2020-12-29 ENCOUNTER — Emergency Department (HOSPITAL_BASED_OUTPATIENT_CLINIC_OR_DEPARTMENT_OTHER)
Admission: EM | Admit: 2020-12-29 | Discharge: 2020-12-29 | Disposition: A | Discharge status: Home / Self Care | Payer: 59 | Attending: Emergency Medicine | Admitting: Emergency Medicine

## 2020-12-29 DIAGNOSIS — Z7984 Long term (current) use of oral hypoglycemic drugs: Secondary | ICD-10-CM | POA: Diagnosis not present

## 2020-12-29 DIAGNOSIS — Z79899 Other long term (current) drug therapy: Secondary | ICD-10-CM | POA: Insufficient documentation

## 2020-12-29 DIAGNOSIS — R208 Other disturbances of skin sensation: Secondary | ICD-10-CM | POA: Diagnosis not present

## 2020-12-29 DIAGNOSIS — Z87891 Personal history of nicotine dependence: Secondary | ICD-10-CM | POA: Insufficient documentation

## 2020-12-29 DIAGNOSIS — M79672 Pain in left foot: Secondary | ICD-10-CM | POA: Insufficient documentation

## 2020-12-29 DIAGNOSIS — I10 Essential (primary) hypertension: Secondary | ICD-10-CM | POA: Insufficient documentation

## 2020-12-29 DIAGNOSIS — E119 Type 2 diabetes mellitus without complications: Secondary | ICD-10-CM | POA: Insufficient documentation

## 2020-12-29 MED ORDER — HYDROCODONE-ACETAMINOPHEN 5-325 MG PO TABS: 2.0000 | ORAL_TABLET | Freq: Four times a day (QID) | ORAL | 0 refills | Status: DC | PRN

## 2020-12-29 MED ORDER — HYDROCODONE-ACETAMINOPHEN 5-325 MG PO TABS
1.0000 | ORAL_TABLET | Freq: Once | ORAL | Status: AC
Start: 2020-12-29 — End: 2020-12-29
  Administered 2020-12-29: 1 via ORAL
  Filled 2020-12-29: qty 1

## 2020-12-29 NOTE — ED Triage Notes (Signed)
Reports pain in top of left foot.  Hx of neuropathy.  Takes gabapentin.  Reports no relief for this pain.

## 2020-12-29 NOTE — ED Provider Notes (Signed)
[facility] Provider Note: [name], MD, FACEP  CSN: [id number] MRN: [id number] ARRIVAL: 12/29/20 at [place]: [facility]   HISTORY OF PRESENT ILLNESS  12/29/20 1:58 AM Amanda Tran is a 55 y.o. 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  She is here with pain on the dorsal aspect of her left foot for the past day.  There is no injury to her left foot there is no associated swelling, erythema or rash.  The pain is worse with ambulation.  It is improved, but not completely relieved, with her usual gabapentin.  The dorsal aspect of her foot has hyperesthesia/allodynia, with the pain is severe even with light touch of the skin.  She has follow-up appointment with her podiatrist in 3 days.   Past Medical History:  Diagnosis Date   Anemia    Anxiety    Diabetes ([place])    DVT (deep venous thrombosis) ([place]) 03/11/2012   Left popliteal    Hyperlipidemia    Hypertension    Vitamin D deficiency     Past Surgical History:  Procedure Laterality Date   CESAREAN SECTION     1990, 1993.1994, 1996   TUBAL LIGATION      Family History  Problem Relation Age of Onset   Hypertension Father    Diabetes Father    Kidney failure Mother    Diabetes Sister    Colon cancer Neg Hx    Esophageal cancer Neg Hx    Stomach cancer Neg Hx    Rectal cancer Neg Hx     Social History   Tobacco Use   Smoking status: Former    Packs/day: 0.50    Years: 25.00    Pack years: 12.50    Types: Cigarettes    Quit date: 09/21/2017    Years since quitting: 3.2   Smokeless tobacco: Never  Vaping Use   Vaping Use: Never used  Substance Use Topics   Alcohol use: Yes    Comment: occasional   Drug use: No    Prior to Admission medications   Medication Sig Start Date End Date Taking? Authorizing Provider  HYDROcodone-acetaminophen (NORCO) 5-325 MG tablet Take 2 tablets by mouth every 6 (six) hours as needed for severe pain.  12/29/20  Yes Atalie Oros, MD  amLODipine (NORVASC) 10 MG tablet Take 1 tablet (10 mg total) by mouth daily. NEEDS PASS 12/04/20 03/04/21  [name], NP  atorvastatin (LIPITOR) 20 MG tablet Take 1 tablet (20 mg total) by mouth daily. NEEDS PASS 12/04/20 03/04/21  [name], NP  Blood Glucose Monitoring Suppl (ONETOUCH VERIO) w/Device KIT Use to check blood sugar once daily. E11.65 05/26/20   Charlott Rakes, MD  ferrous sulfate 325 (65 FE) MG tablet Take 1 tablet (325 mg total) by mouth 2 (two) times daily with a meal. 08/09/19   [name], NP  gabapentin (NEURONTIN) 600 MG tablet Take 1 tablet (600 mg total) by mouth at bedtime. 07/13/20   Hyatt, Max T, DPM  glimepiride (AMARYL) 4 MG tablet Take 0.5 tablets (2 mg total) by mouth 2 (two) times daily. PASS PROGRAM 12/04/20 03/04/21  [name], NP  glucose blood [facility] VERIO) test strip Use to check blood sugar once daily. E11.65 05/26/20   Charlott Rakes, MD  lisinopril (ZESTRIL) 5 MG tablet Take 1 tablet (5 mg total) by mouth daily. For kidney protection with diabetes 12/04/20   Raul Del,  Vernia Buff, NP  loratadine (CLARITIN) 10 MG tablet Take 1 tablet (10 mg total) by mouth daily. 12/04/20 03/04/21  [name], NP  meloxicam (MOBIC) 15 MG tablet Take 1 tablet (15 mg total) by mouth daily. 09/21/20   Hyatt, Max T, DPM  OneTouch Delica Lancets 09F MISC Use to check blood sugar once daily. E11.65 05/26/20   Charlott Rakes, MD  PARoxetine (PAXIL) 10 MG tablet Take 1 tablet (10 mg total) by mouth daily. NEEDS PASS 09/01/20 10/01/20  [name], NP  sitaGLIPtin (JANUVIA) 100 MG tablet Take 1 tablet (100 mg total) by mouth daily. NEEDS PASS 12/04/20 03/04/21  [name], NP    Allergies Patient has no known allergies.   REVIEW OF SYSTEMS  Negative except as noted here or in the History of Present Illness.   PHYSICAL EXAMINATION  Initial Vital Signs Blood pressure (!) 132/91, pulse (!) 108, temperature 98.2 F (36.8  C), temperature source Oral, resp. rate 18, height $RemoveBe'5\' 1"'lHWWJfiYN$  (1.549 m), weight 96.2 kg, last menstrual period 11/29/2020, SpO2 100 %.  Examination General: Well-developed, well-nourished female in no acute distress; appearance consistent with age of record HENT: normocephalic; atraumatic Eyes: Normal appearance Neck: supple Heart: regular rate and rhythm Lungs: clear to auscultation bilaterally Abdomen: soft; nondistended; nontender; bowel sounds present Extremities: No deformity; full range of motion; pulses normal; tenderness/allodynia of dorsal left foot without swelling, erythema or rash Neurologic: Awake, alert and oriented; motor function intact in all extremities and symmetric; no facial droop Skin: Warm and dry Psychiatric: Normal mood and affect   RESULTS  Summary of this visit's results, reviewed and interpreted by myself:   EKG Interpretation  Date/Time:    Ventricular Rate:    PR Interval:    QRS Duration:   QT Interval:    QTC Calculation:   R Axis:     Text Interpretation:         Laboratory Studies: No results found for this or any previous visit (from the past 24 hour(s)). Imaging Studies: No results found.  ED COURSE and MDM  Nursing notes, initial and subsequent vitals signs, including pulse oximetry, reviewed and interpreted by myself.  Vitals:   12/29/20 0046 12/29/20 0047  BP: (!) 132/91   Pulse: (!) 108   Resp: 18   Temp: 98.2 F (36.8 C)   TempSrc: Oral   SpO2: 100%   Weight:  96.2 kg  Height:  $Remove'5\' 1"'gYQtAVg$  (1.549 m)   Medications  HYDROcodone-acetaminophen (NORCO/VICODIN) 5-325 MG per tablet 1 tablet (has no administration in time range)    The pain is atypical for her usual neuropathy but the hyperesthesia suggest a neuropathic origin.  This could be early shingles without a rash.  We will start her on an analgesic and have her follow-up with her podiatrist as scheduled.  PROCEDURES  Procedures   ED DIAGNOSES     ICD-10-CM   1. Left  foot pain  M79.672     2. Allodynia  R20.8          Shanon Rosser, MD 12/29/20 [phone]

## 2021-01-01 ENCOUNTER — Encounter: Payer: Self-pay | Admitting: Podiatry

## 2021-01-01 ENCOUNTER — Ambulatory Visit (INDEPENDENT_AMBULATORY_CARE_PROVIDER_SITE_OTHER): Payer: 59 | Admitting: Podiatry

## 2021-01-01 ENCOUNTER — Other Ambulatory Visit: Payer: Self-pay

## 2021-01-01 ENCOUNTER — Ambulatory Visit (INDEPENDENT_AMBULATORY_CARE_PROVIDER_SITE_OTHER): Payer: 59

## 2021-01-01 ENCOUNTER — Other Ambulatory Visit: Payer: Self-pay | Admitting: Podiatry

## 2021-01-01 DIAGNOSIS — M778 Other enthesopathies, not elsewhere classified: Secondary | ICD-10-CM

## 2021-01-01 DIAGNOSIS — S99922A Unspecified injury of left foot, initial encounter: Secondary | ICD-10-CM

## 2021-01-01 MED ORDER — DEXAMETHASONE SODIUM PHOSPHATE 120 MG/30ML IJ SOLN: 4.0000 mg | Freq: Once | INTRAMUSCULAR | Status: DC

## 2021-01-01 NOTE — Progress Notes (Signed)
  Subjective:  Patient ID: Amanda Tran, female    DOB: Feb 09, 1966,   MRN: [id number]  Chief Complaint  Patient presents with   Foot Pain    Went to the ER last Thursday and was hurting on the top of the left foot and does swell and had some pain medicine and I am not sure if some one at work ran over with wheel chair     55 y.o. female presents for left foot swelling and pain that has been present for about a week. Went to the ER and told she had swelling because of blood pressure med. Has not had any injury. Currently taking meloxicam and pain meds with some relief. Area is improving.  . Denies any other pedal complaints. Denies n/v/f/c.   Past Medical History:  Diagnosis Date   Anemia    Anxiety    Diabetes ([place])    DVT (deep venous thrombosis) ([place]) 03/11/2012   Left popliteal    Hyperlipidemia    Hypertension    Vitamin D deficiency     Objective:  Physical Exam: Vascular: DP/PT pulses 2/4 bilateral. CFT <3 seconds. Normal hair growth on digits. No edema.  Skin. No lacerations or abrasions bilateral feet. Edema and erythema noted to dorsal left foot Musculoskeletal: MMT 5/5 bilateral lower extremities in DF, PF, Inversion and Eversion. Deceased ROM in DF of ankle joint. Tender to palpation of dorsal second and third TMTJ.  Neurological: Sensation intact to light touch.   Assessment:   1. Capsulitis of foot, left      Plan:  Patient was evaluated and treated and all questions answered. -Xrays reviewed. No acute fractures or dislocations noted.  -Discussed treatement options for gouty arthritis and gout education provided. -Patient opted for injection. After oral consent, injected  left second tarsometatarsal jointwith 1cc lidocaine plain and Dexmethasone phosphate without complication; post injection care explained. -Discussed diet and modifications.  -Surgical shoe provided.  -Ordered arthritic lab panel; will call patient with results if abnormal -Advised  patient to call if symptoms are not improved within 1 week -Patient to return in 3 weeks for re-check/further discussion for long term management of gout or sooner if condition worsens.   Lorenda Peck, DPM

## 2021-01-01 NOTE — Patient Instructions (Signed)
Gout  Gout is a condition that causes painful swelling of the joints. Gout is a type of inflammation of the joints (arthritis). This condition is caused by having too much uric acid in the body. Uric acid is a chemical that forms when the body breaks down substances called purines.Purines are important for building body proteins. When the body has too much uric acid, sharp crystals can form and build up inside the joints. This causes pain and swelling. Gout attacks can happen quickly and may be very painful (acute gout). Over time, the attacks can affect more joints and become more frequent (chronic gout). Gout can also cause uric acid to build up under the skin and inside thekidneys. What are the causes? This condition is caused by too much uric acid in your blood. This can happen because: Your kidneys do not remove enough uric acid from your blood. This is the most common cause. Your body makes too much uric acid. This can happen with some cancers and cancer treatments. It can also occur if your body is breaking down too many red blood cells (hemolytic anemia). You eat too many foods that are high in purines. These foods include organ meats and some seafood. Alcohol, especially beer, is also high in purines. A gout attack may be triggered by trauma or stress. What increases the risk? You are more likely to develop this condition if you: Have a family history of gout. Are female and middle-aged. Are female and have gone through menopause. Are obese. Frequently drink alcohol, especially beer. Are dehydrated. Lose weight too quickly. Have an organ transplant. Have lead poisoning. Take certain medicines, including aspirin, cyclosporine, diuretics, levodopa, and niacin. Have kidney disease. Have a skin condition called psoriasis. What are the signs or symptoms? An attack of acute gout happens quickly. It usually occurs in just one joint. The most common place is the big toe. Attacks often start  at night. Other joints that may be affected include joints of the feet, ankle, knee, fingers, wrist, or elbow. Symptoms of this condition may include: Severe pain. Warmth. Swelling. Stiffness. Tenderness. The affected joint may be very painful to touch. Shiny, red, or purple skin. Chills and fever. Chronic gout may cause symptoms more frequently. More joints may be involved. You may also have white or yellow lumps (tophi) on your hands or feet or in other areas near your joints. How is this diagnosed? This condition is diagnosed based on your symptoms, medical history, and physical exam. You may have tests, such as: Blood tests to measure uric acid levels. Removal of joint fluid with a thin needle (aspiration) to look for uric acid crystals. X-rays to look for joint damage. How is this treated? Treatment for this condition has two phases: treating an acute attack and preventing future attacks. Acute gout treatment may include medicines to reduce pain and swelling, including: NSAIDs. Steroids. These are strong anti-inflammatory medicines that can be taken by mouth (orally) or injected into a joint. Colchicine. This medicine relieves pain and swelling when it is taken soon after an attack. It can be given by mouth or through an IV. Preventive treatment may include: Daily use of smaller doses of NSAIDs or colchicine. Use of a medicine that reduces uric acid levels in your blood. Changes to your diet. You may need to see a dietitian about what to eat and drink to prevent gout. Follow these instructions at home: During a gout attack  If directed, put ice on the affected area: Put   ice in a plastic bag. Place a towel between your skin and the bag. Leave the ice on for 20 minutes, 2-3 times a day. Raise (elevate) the affected joint above the level of your heart as often as possible. Rest the joint as much as possible. If the affected joint is in your leg, you may be given crutches to  use. Follow instructions from your health care provider about eating or drinking restrictions.  Avoiding future gout attacks Follow a low-purine diet as told by your dietitian or health care provider. Avoid foods and drinks that are high in purines, including liver, kidney, anchovies, asparagus, herring, mushrooms, mussels, and beer. Maintain a healthy weight or lose weight if you are overweight. If you want to lose weight, talk with your health care provider. It is important that you do not lose weight too quickly. Start or maintain an exercise program as told by your health care provider. Eating and drinking Drink enough fluids to keep your urine pale yellow. If you drink alcohol: Limit how much you use to: 0-1 drink a day for women. 0-2 drinks a day for men. Be aware of how much alcohol is in your drink. In the [place] one drink equals one 12 oz bottle of beer (355 mL) one 5 oz glass of wine (148 mL), or one 1 oz glass of hard liquor (44 mL). General instructions Take over-the-counter and prescription medicines only as told by your health care provider. Do not drive or use heavy machinery while taking prescription pain medicine. Return to your normal activities as told by your health care provider. Ask your health care provider what activities are safe for you. Keep all follow-up visits as told by your health care provider. This is important. Contact a health care provider if you have: Another gout attack. Continuing symptoms of a gout attack after 10 days of treatment. Side effects from your medicines. Chills or a fever. Burning pain when you urinate. Pain in your lower back or belly. Get help right away if you: Have severe or uncontrolled pain. Cannot urinate. Summary Gout is painful swelling of the joints caused by inflammation. The most common site of pain is the big toe, but it can affect other joints in the body. Medicines and dietary changes can help to prevent and treat gout  attacks. This information is not intended to replace advice given to you by your health care provider. Make sure you discuss any questions you have with your healthcare provider. Document Revised: 10/01/2017 Document Reviewed: 10/01/2017 Elsevier Patient Education  2022 Elsevier Inc.  

## 2021-01-02 ENCOUNTER — Encounter: Payer: Self-pay | Admitting: Nurse Practitioner

## 2021-01-02 ENCOUNTER — Other Ambulatory Visit: Payer: Self-pay | Admitting: Nurse Practitioner

## 2021-01-02 DIAGNOSIS — I10 Essential (primary) hypertension: Secondary | ICD-10-CM

## 2021-01-02 DIAGNOSIS — E1165 Type 2 diabetes mellitus with hyperglycemia: Secondary | ICD-10-CM

## 2021-01-02 LAB — URIC ACID: Uric Acid, Serum: 4.2 mg/dL (ref 2.5–7.0)

## 2021-01-02 MED ORDER — OZEMPIC (0.25 OR 0.5 MG/DOSE) 2 MG/1.5ML ~~LOC~~ SOPN: 0.2500 mg | PEN_INJECTOR | SUBCUTANEOUS | 0 refills | Status: AC

## 2021-01-29 ENCOUNTER — Ambulatory Visit (INDEPENDENT_AMBULATORY_CARE_PROVIDER_SITE_OTHER): Payer: 59 | Admitting: Podiatry

## 2021-01-29 ENCOUNTER — Other Ambulatory Visit: Payer: Self-pay

## 2021-01-29 ENCOUNTER — Encounter: Payer: Self-pay | Admitting: Podiatry

## 2021-01-29 DIAGNOSIS — M778 Other enthesopathies, not elsewhere classified: Secondary | ICD-10-CM

## 2021-01-29 MED ORDER — MELOXICAM 15 MG PO TABS: 15.0000 mg | ORAL_TABLET | Freq: Every day | ORAL | 0 refills | Status: DC

## 2021-01-29 NOTE — Progress Notes (Signed)
  Subjective:  Patient ID: Amanda Tran, female    DOB: Nov 09, 1965,   MRN: [id number]  Chief Complaint  Patient presents with   Foot Pain    The shot helped and the shoe as well and there was some swelling due to being on it a lot yesterday     55 y.o. female presents for follow-up left foot swelling and pain. Relates the pain has improved about 70%. The injection and surgical shoe were helpful. Her uric acids levels were in normal limits. Area is improving.  . Denies any other pedal complaints. Denies n/v/f/c.   Past Medical History:  Diagnosis Date   Anemia    Anxiety    Diabetes ([place])    DVT (deep venous thrombosis) ([place]) 03/11/2012   Left popliteal    Hyperlipidemia    Hypertension    Vitamin D deficiency     Objective:  Physical Exam: Vascular: DP/PT pulses 2/4 bilateral. CFT <3 seconds. Normal hair growth on digits. No edema.  Skin. No lacerations or abrasions bilateral feet. Mild edema noted to dorsal left foot.  Musculoskeletal: MMT 5/5 bilateral lower extremities in DF, PF, Inversion and Eversion. Deceased ROM in DF of ankle joint. Mild tender to palpation of dorsal second and third TMTJ.  Neurological: Sensation intact to light touch.   Assessment:   1. Capsulitis of foot, left       Plan:  Patient was evaluated and treated and all questions answered. -Xrays reviewed. No acute fractures or dislocations noted.  -Discussed treatement options for capsulitis vs inflammatory arthritis.  -Prescription for meloxicam provided.  -Discussed diet and modifications.  -Surgical shoe as needed.  -Patient to return as needed.    Lorenda Peck, DPM

## 2021-01-31 ENCOUNTER — Encounter: Payer: Self-pay | Admitting: Nurse Practitioner

## 2021-01-31 ENCOUNTER — Other Ambulatory Visit: Payer: Self-pay | Admitting: Nurse Practitioner

## 2021-02-02 ENCOUNTER — Encounter: Payer: Self-pay | Admitting: Nurse Practitioner

## 2021-02-02 ENCOUNTER — Other Ambulatory Visit: Payer: Self-pay | Admitting: Nurse Practitioner

## 2021-02-02 MED ORDER — LISINOPRIL 10 MG PO TABS: 10.0000 mg | ORAL_TABLET | Freq: Every day | ORAL | 0 refills | Status: DC

## 2021-02-06 ENCOUNTER — Ambulatory Visit (HOSPITAL_BASED_OUTPATIENT_CLINIC_OR_DEPARTMENT_OTHER): Payer: 59 | Admitting: Nurse Practitioner

## 2021-02-06 ENCOUNTER — Encounter: Payer: Self-pay | Admitting: Nurse Practitioner

## 2021-02-06 DIAGNOSIS — E1165 Type 2 diabetes mellitus with hyperglycemia: Secondary | ICD-10-CM | POA: Diagnosis not present

## 2021-02-06 NOTE — Progress Notes (Signed)
Virtual Visit via Telephone Note Due to national recommendations of social distancing due to [place] 19, telehealth visit is felt to be most appropriate for this patient at this time.  I discussed the limitations, risks, security and privacy concerns of performing an evaluation and management service by telephone and the availability of in person appointments. I also discussed with the patient that there may be a patient responsible charge related to this service. The patient expressed understanding and agreed to proceed.    I connected with Amanda Tran on 02/06/21  at   1:30 PM EST  EDT by telephone and verified that I am speaking with the correct person using two identifiers.  Location of Patient: Private Residence   Location of Provider: [place] and [employer] Office    Persons participating in Telemedicine visit: [name] FNP-BC Amanda Tran    History of Present Illness: Telemedicine visit for: DM 2 PMH includes: HTN, HPL, DM   DM 2 She states blood glucose levels have been elevated  up to 180 in the mornings. She is currently taking januvia 100 mg at night and glimepiride 2 mg BID.  I have instructed her to take januvia 50 mg in the am and 50 mg in the pm if her blood glucose readings are not responsive to januvia 100 mg with dinner along with with 1 tablet of amaryl and 1 tablet of amaryl in the evening.    Past Medical History:  Diagnosis Date   Anemia    Anxiety    Diabetes ([place])    DVT (deep venous thrombosis) ([place]) 03/11/2012   Left popliteal    Hyperlipidemia    Hypertension    Vitamin D deficiency     Past Surgical History:  Procedure Laterality Date   CESAREAN SECTION     1990, 1993.1994, 1996   TUBAL LIGATION      Family History  Problem Relation Age of Onset   Hypertension Father    Diabetes Father    Kidney failure Mother    Diabetes Sister    Colon cancer Neg Hx    Esophageal cancer Neg Hx    Stomach cancer Neg  Hx    Rectal cancer Neg Hx     Social History   Socioeconomic History   Marital status: Divorced    Spouse name: Not on file   Number of children: 5   Years of education: Not on file   Highest education level: High school graduate  Occupational History   Not on file  Tobacco Use   Smoking status: Former    Packs/day: 0.50    Years: 25.00    Pack years: 12.50    Types: Cigarettes    Quit date: 09/21/2017    Years since quitting: 3.3   Smokeless tobacco: Never  Vaping Use   Vaping Use: Never used  Substance and Sexual Activity   Alcohol use: Yes    Comment: occasional   Drug use: No   Sexual activity: Not Currently    Birth control/protection: Condom, Surgical    Comment: 1st intercourse 55 yo-More than 5 partners-BTL  Other Topics Concern   Not on file  Social History Narrative   Not on file   Social Determinants of Health   Financial Resource Strain: Not on file  Food Insecurity: Not on file  Transportation Needs: Not on file  Physical Activity: Not on file  Stress: Not on file  Social Connections: Not on file     Observations/Objective:  Awake, alert and oriented x 3   Review of Systems  Constitutional:  Negative for fever, malaise/fatigue and weight loss.  HENT: Negative.  Negative for nosebleeds.   Eyes: Negative.  Negative for blurred vision, double vision and photophobia.  Respiratory: Negative.  Negative for cough and shortness of breath.   Cardiovascular: Negative.  Negative for chest pain, palpitations and leg swelling.  Gastrointestinal: Negative.  Negative for heartburn, nausea and vomiting.  Musculoskeletal: Negative.  Negative for myalgias.  Neurological: Negative.  Negative for dizziness, focal weakness, seizures and headaches.  Psychiatric/Behavioral: Negative.  Negative for suicidal ideas.    Assessment and Plan: Diagnoses and all orders for this visit:  Type 2 diabetes mellitus with hyperglycemia, without long-term current use of insulin  ([place]) Taking in am 1 tablet of amaryl. Evening take Januvia and 1 tablet of amaryl.    Follow Up Instructions Return in about 3 months (around 05/09/2021).     I discussed the assessment and treatment plan with the patient. The patient was provided an opportunity to ask questions and all were answered. The patient agreed with the plan and demonstrated an understanding of the instructions.   The patient was advised to call back or seek an in-person evaluation if the symptoms worsen or if the condition fails to improve as anticipated.  I provided 10 minutes of non-face-to-face time during this encounter including median intraservice time, reviewing previous notes, labs, imaging, medications and explaining diagnosis and management.  [name], FNP-BC

## 2021-02-20 ENCOUNTER — Other Ambulatory Visit (HOSPITAL_BASED_OUTPATIENT_CLINIC_OR_DEPARTMENT_OTHER): Payer: Self-pay

## 2021-02-20 ENCOUNTER — Encounter (HOSPITAL_BASED_OUTPATIENT_CLINIC_OR_DEPARTMENT_OTHER): Payer: Self-pay | Admitting: Emergency Medicine

## 2021-02-20 ENCOUNTER — Emergency Department (HOSPITAL_BASED_OUTPATIENT_CLINIC_OR_DEPARTMENT_OTHER)
Admission: EM | Admit: 2021-02-20 | Discharge: 2021-02-20 | Disposition: A | Discharge status: Home / Self Care | Payer: 59 | Attending: Emergency Medicine | Admitting: Emergency Medicine

## 2021-02-20 ENCOUNTER — Other Ambulatory Visit: Payer: Self-pay

## 2021-02-20 DIAGNOSIS — E119 Type 2 diabetes mellitus without complications: Secondary | ICD-10-CM | POA: Insufficient documentation

## 2021-02-20 DIAGNOSIS — I10 Essential (primary) hypertension: Secondary | ICD-10-CM | POA: Diagnosis not present

## 2021-02-20 DIAGNOSIS — U071 COVID-19: Secondary | ICD-10-CM | POA: Insufficient documentation

## 2021-02-20 DIAGNOSIS — Z87891 Personal history of nicotine dependence: Secondary | ICD-10-CM | POA: Insufficient documentation

## 2021-02-20 DIAGNOSIS — Z7984 Long term (current) use of oral hypoglycemic drugs: Secondary | ICD-10-CM | POA: Insufficient documentation

## 2021-02-20 DIAGNOSIS — Z79899 Other long term (current) drug therapy: Secondary | ICD-10-CM | POA: Diagnosis not present

## 2021-02-20 DIAGNOSIS — R059 Cough, unspecified: Secondary | ICD-10-CM | POA: Diagnosis present

## 2021-02-20 LAB — RESP PANEL BY RT-PCR (FLU A&B, COVID) ARPGX2
Influenza A by PCR: NEGATIVE
Influenza B by PCR: NEGATIVE
SARS Coronavirus 2 by RT PCR: POSITIVE — AB

## 2021-02-20 MED ORDER — BENZONATATE 100 MG PO CAPS
100.0000 mg | ORAL_CAPSULE | Freq: Three times a day (TID) | ORAL | 0 refills | Status: DC
  Filled 2021-02-20: qty 21, 7d supply, fill #0

## 2021-02-20 MED ORDER — MOLNUPIRAVIR EUA 200MG CAPSULE
4.0000 | ORAL_CAPSULE | Freq: Two times a day (BID) | ORAL | 0 refills | Status: AC
  Filled 2021-02-20: qty 40, 5d supply, fill #0

## 2021-02-20 NOTE — ED Provider Notes (Signed)
[facility] Provider Note   CSN: [id number] Arrival date & time: 02/20/21  0845     History Chief Complaint  Patient presents with   URI    Amanda Tran is a 55 y.o. female.  The history is provided by the patient.  URI Presenting symptoms: congestion and cough   Presenting symptoms: no ear pain, no fever and no sore throat   Cough:    Cough characteristics:  Non-productive   Sputum characteristics:  Nondescript   Severity:  Mild   Duration:  2 days   Timing:  Intermittent   Progression:  Waxing and waning   Chronicity:  New Onset quality:  Gradual Timing:  Intermittent Progression:  Waxing and waning Chronicity:  New Relieved by:  Nothing Worsened by:  Nothing Associated symptoms: no arthralgias, no headaches, no myalgias, no neck pain, no sinus pain, no sneezing, no swollen glands and no wheezing       Past Medical History:  Diagnosis Date   Anemia    Anxiety    Diabetes ([place])    DVT (deep venous thrombosis) ([place]) 03/11/2012   Left popliteal    Hyperlipidemia    Hypertension    Vitamin D deficiency     Patient Active Problem List   Diagnosis Date Noted   Constipation 09/22/2019   Rectal bleeding 09/22/2019   Screening breast examination 05/04/2019   Shortness of breath 07/02/2016   Routine screening for STI (sexually transmitted infection) 07/02/2016   Vaginal irritation 10/06/2015   Boil 10/06/2015   HTN (hypertension) 01/09/2015   Vitamin D insufficiency 12/21/2014   Fatigue 12/21/2014   Allergic rhinitis 12/21/2014   Family history of diabetes mellitus (DM) 03/23/2014   Iron deficiency anemia 03/18/2012   Nicotine dependence 03/18/2012    Past Surgical History:  Procedure Laterality Date   CESAREAN SECTION     1990, 1993.1994, 1996   TUBAL LIGATION       OB History     Gravida  5   Para      Term      Preterm      AB      Living  5      SAB      IAB      Ectopic      Multiple       Live Births  5           Family History  Problem Relation Age of Onset   Hypertension Father    Diabetes Father    Kidney failure Mother    Diabetes Sister    Colon cancer Neg Hx    Esophageal cancer Neg Hx    Stomach cancer Neg Hx    Rectal cancer Neg Hx     Social History   Tobacco Use   Smoking status: Former    Packs/day: 0.50    Years: 25.00    Pack years: 12.50    Types: Cigarettes    Quit date: 09/21/2017    Years since quitting: 3.4   Smokeless tobacco: Never  Vaping Use   Vaping Use: Never used  Substance Use Topics   Alcohol use: Yes    Comment: occasional   Drug use: No    Home Medications Prior to Admission medications   Medication Sig Start Date End Date Taking? Authorizing Provider  benzonatate (TESSALON) 100 MG capsule Take 1 capsule (100 mg total) by mouth every 8 (eight) hours. 02/20/21  Yes Lennice Sites, DO  molnupiravir EUA (LAGEVRIO) 200 mg CAPS capsule Take 4 capsules (800 mg total) by mouth 2 (two) times daily for 5 days. 02/20/21 02/25/21 Yes Layaan Mott, DO  amLODipine (NORVASC) 10 MG tablet Take 1 tablet (10 mg total) by mouth daily. NEEDS PASS 12/04/20 03/04/21  [name], NP  atorvastatin (LIPITOR) 20 MG tablet Take 1 tablet (20 mg total) by mouth daily. NEEDS PASS 12/04/20 03/04/21  [name], NP  Blood Glucose Monitoring Suppl (ONETOUCH VERIO) w/Device KIT Use to check blood sugar once daily. E11.65 05/26/20   Charlott Rakes, MD  ferrous sulfate 325 (65 FE) MG tablet Take 1 tablet (325 mg total) by mouth 2 (two) times daily with a meal. 08/09/19   [name], NP  gabapentin (NEURONTIN) 600 MG tablet Take 1 tablet (600 mg total) by mouth at bedtime. 07/13/20   Hyatt, Max T, DPM  glimepiride (AMARYL) 4 MG tablet Take 0.5 tablets (2 mg total) by mouth 2 (two) times daily. PASS PROGRAM 12/04/20 03/04/21  [name], NP  glucose blood [name] VERIO) test strip Use to check blood sugar once daily. E11.65 05/26/20    Charlott Rakes, MD  HYDROcodone-acetaminophen (NORCO) 5-325 MG tablet Take 2 tablets by mouth every 6 (six) hours as needed for severe pain. 12/29/20   Molpus, John, MD  JANUVIA 100 MG tablet Take 100 mg by mouth daily. 01/23/21   [provider]  lisinopril (ZESTRIL) 10 MG tablet Take 1 tablet (10 mg total) by mouth daily. For kidney protection with diabetes 02/02/21 05/03/21  [name], NP  loratadine (CLARITIN) 10 MG tablet Take 1 tablet (10 mg total) by mouth daily. 12/04/20 03/04/21  [name], NP  meloxicam (MOBIC) 15 MG tablet Take 1 tablet (15 mg total) by mouth daily. 01/29/21   Lorenda Peck, MD  OneTouch Delica Lancets 54M MISC Use to check blood sugar once daily. E11.65 05/26/20   Charlott Rakes, MD  PARoxetine (PAXIL) 10 MG tablet Take 1 tablet (10 mg total) by mouth daily. NEEDS PASS 09/01/20 10/01/20  [name], NP    Allergies    Patient has no known allergies.  Review of Systems   Review of Systems  Constitutional:  Negative for chills and fever.  HENT:  Positive for congestion. Negative for ear pain, sinus pain, sneezing and sore throat.   Eyes:  Negative for pain and visual disturbance.  Respiratory:  Positive for cough. Negative for shortness of breath and wheezing.   Cardiovascular:  Negative for chest pain and palpitations.  Gastrointestinal:  Negative for abdominal pain and vomiting.  Genitourinary:  Negative for dysuria and hematuria.  Musculoskeletal:  Negative for arthralgias, back pain, myalgias and neck pain.  Skin:  Negative for color change and rash.  Neurological:  Negative for seizures, syncope and headaches.  All other systems reviewed and are negative.  Physical Exam Updated Vital Signs BP 112/85 (BP Location: Right Arm)   Pulse (!) 107   Temp 98.8 F (37.1 C) (Oral)   Resp 20   Ht _0  (1.549 m)   Wt 95.3 kg   SpO2 98%   BMI 39.68 kg/m   Physical Exam Vitals and nursing note reviewed.  Constitutional:      General:  She is not in acute distress.    Appearance: She is well-developed.  HENT:     Head: Normocephalic and atraumatic.     Nose: Nose normal.     Mouth/Throat:     Mouth: Mucous  membranes are moist.  Eyes:     Extraocular Movements: Extraocular movements intact.     Conjunctiva/sclera: Conjunctivae normal.     Pupils: Pupils are equal, round, and reactive to light.  Cardiovascular:     Rate and Rhythm: Normal rate and regular rhythm.     Pulses: Normal pulses.     Heart sounds: Normal heart sounds. No murmur heard. Pulmonary:     Effort: Pulmonary effort is normal. No respiratory distress.     Breath sounds: Normal breath sounds.  Abdominal:     Palpations: Abdomen is soft.     Tenderness: There is no abdominal tenderness.  Musculoskeletal:        General: No swelling.     Cervical back: Normal range of motion and neck supple.  Skin:    General: Skin is warm and dry.     Capillary Refill: Capillary refill takes less than 2 seconds.  Neurological:     General: No focal deficit present.     Mental Status: She is alert.  Psychiatric:        Mood and Affect: Mood normal.    ED Results / Procedures / Treatments   Labs (all labs ordered are listed, but only abnormal results are displayed) Labs Reviewed  RESP PANEL BY RT-PCR (FLU A&B, COVID) [id number]    EKG None  Radiology No results found.  Procedures Procedures   Medications Ordered in ED Medications - No data to display  ED Course  I have reviewed the triage vital signs and the nursing notes.  Pertinent labs & imaging results that were available during my care of the patient were reviewed by me and considered in my medical decision making (see chart for details).    MDM Rules/Calculators/A&P                           Amanda Tran is here with viral type symptoms.  Appears to be positive for COVID-19.  Symptoms for the last 2 days.  Normal vitals.  No severe respiratory symptoms.  Will treat with antiviral  and Tessalon.  Understands return precautions and discharged in ED in good condition.  This chart was dictated using voice recognition software.  Despite best efforts to proofread,  errors can occur which can change the documentation meaning.   Final Clinical Impression(s) / ED Diagnoses Final diagnoses:  [id number]    Rx / DC Orders ED Discharge Orders          Ordered    molnupiravir EUA (LAGEVRIO) 200 mg CAPS capsule  2 times daily        02/20/21 1231    benzonatate (TESSALON) 100 MG capsule  Every 8 hours        02/20/21 1232             Lennice Sites, DO 02/20/21 1234

## 2021-02-20 NOTE — ED Triage Notes (Signed)
URI , nasal congestion , chills , headache , body aches , dry cough

## 2021-02-20 NOTE — Discharge Instructions (Addendum)
You are positive for COVID-19.  Continue to take Tylenol and ibuprofen as needed.  Take Tessalon Perles as needed for cough.  You have been prescribed an antiviral to help treat your COVID as well.  Please return if symptoms worsen.

## 2021-02-26 ENCOUNTER — Other Ambulatory Visit: Payer: Self-pay

## 2021-02-26 ENCOUNTER — Emergency Department (HOSPITAL_COMMUNITY)
Admission: EM | Admit: 2021-02-26 | Discharge: 2021-02-26 | Disposition: A | Discharge status: Home / Self Care | Payer: 59 | Attending: Emergency Medicine | Admitting: Emergency Medicine

## 2021-02-26 ENCOUNTER — Encounter (HOSPITAL_COMMUNITY): Payer: Self-pay

## 2021-02-26 DIAGNOSIS — Z79899 Other long term (current) drug therapy: Secondary | ICD-10-CM | POA: Insufficient documentation

## 2021-02-26 DIAGNOSIS — E119 Type 2 diabetes mellitus without complications: Secondary | ICD-10-CM | POA: Insufficient documentation

## 2021-02-26 DIAGNOSIS — Y92410 Unspecified street and highway as the place of occurrence of the external cause: Secondary | ICD-10-CM | POA: Diagnosis not present

## 2021-02-26 DIAGNOSIS — R519 Headache, unspecified: Secondary | ICD-10-CM | POA: Diagnosis present

## 2021-02-26 DIAGNOSIS — Z7984 Long term (current) use of oral hypoglycemic drugs: Secondary | ICD-10-CM | POA: Diagnosis not present

## 2021-02-26 DIAGNOSIS — Z87891 Personal history of nicotine dependence: Secondary | ICD-10-CM | POA: Diagnosis not present

## 2021-02-26 DIAGNOSIS — I10 Essential (primary) hypertension: Secondary | ICD-10-CM | POA: Insufficient documentation

## 2021-02-26 LAB — CBG MONITORING, ED: Glucose-Capillary: 79 mg/dL (ref 70–99)

## 2021-02-26 MED ORDER — NAPROXEN 500 MG PO TABS
500.0000 mg | ORAL_TABLET | Freq: Once | ORAL | Status: AC
  Administered 2021-02-26: 500 mg via ORAL
  Filled 2021-02-26: qty 1
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
## 2021-02-26 NOTE — Discharge Instructions (Signed)
Take Tylenol or ibuprofen for headaches Expect soreness to go up tomorrow.

## 2021-03-07 ENCOUNTER — Ambulatory Visit: Payer: 59 | Admitting: Nurse Practitioner

## 2021-03-07 ENCOUNTER — Ambulatory Visit: Payer: Self-pay

## 2021-03-07 ENCOUNTER — Telehealth: Payer: Self-pay | Admitting: Nurse Practitioner

## 2021-03-07 NOTE — Telephone Encounter (Signed)
Copied from [place] [id number]. Topic: Appointment Scheduling - Scheduling Inquiry for Clinic >> Mar 07, 2021  8:47 AM Scherrie Gerlach wrote: Reason for CRM: pt had to cancel her appt this morning. Pt had covid 2 weeks ago and  is still having ongoing covid cough and not feeling well.  It is the cough that is the worst.   Pt wants to know if the dr will put in order for her to come by and do a A1C lab only.  Next appt not until Feb, and she said will be time to do another A1c

## 2021-03-07 NOTE — Telephone Encounter (Signed)
Pt had covid 2 weeks ago and is still having a really bad cough.  She could hardly talk to me for coughing in between.  Would like to know if dr will send in a cough med.    Chief Complaint: COVID 19 2 weeks ago - still coughing Symptoms: Non-productive cough Frequency: Started 2 weeks ago Pertinent Negatives: Patient denies No SOB or fever Disposition: [] ED /[] Urgent Care (no appt availability in office) / [] Appointment(In office/virtual)/ []  [facility]/ [] Home Care/ [] Refused Recommended Disposition  Additional Notes: Requesting cough medication and an inhaler. "I used to use an inhaler a while back." Please advise pt.     Answer Assessment - Initial Assessment Questions 1. ONSET: "When did the cough begin?"      2 weeks ago 2. SEVERITY: "How bad is the cough today?"      Severe 3. SPUTUM: "Describe the color of your sputum" (none, dry cough; clear, white, yellow, green)     None 4. HEMOPTYSIS: "Are you coughing up any blood?" If so ask: "How much?" (flecks, streaks, tablespoons, etc.)     No 5. DIFFICULTY BREATHING: "Are you having difficulty breathing?" If Yes, ask: "How bad is it?" (e.g., mild, moderate, severe)    - MILD: No SOB at rest, mild SOB with walking, speaks normally in sentences, can lie down, no retractions, pulse < 100.    - MODERATE: SOB at rest, SOB with minimal exertion and prefers to sit, cannot lie down flat, speaks in phrases, mild retractions, audible wheezing, pulse 100-120.    - SEVERE: Very SOB at rest, speaks in single words, struggling to breathe, sitting hunched forward, retractions, pulse > 120      No 6. FEVER: "Do you have a fever?" If Yes, ask: "What is your temperature, how was it measured, and when did it start?"     No 7. CARDIAC HISTORY: "Do you have any history of heart disease?" (e.g., heart attack, congestive heart failure)      No 8. LUNG HISTORY: "Do you have any history of lung disease?"  (e.g., pulmonary embolus, asthma,  emphysema)     No 9. PE RISK FACTORS: "Do you have a history of blood clots?" (or: recent major surgery, recent prolonged travel, bedridden)     No 10. OTHER SYMPTOMS: "Do you have any other symptoms?" (e.g., runny nose, wheezing, chest pain)       No 11. PREGNANCY: "Is there any chance you are pregnant?" "When was your last menstrual period?"       No 12. TRAVEL: "Have you traveled out of the country in the last month?" (e.g., travel history, exposures)       No  Protocols used: Cough - Acute Non-Productive-A-AH

## 2021-03-08 ENCOUNTER — Encounter: Payer: Self-pay | Admitting: Nurse Practitioner

## 2021-03-08 ENCOUNTER — Other Ambulatory Visit: Payer: Self-pay | Admitting: Nurse Practitioner

## 2021-03-08 DIAGNOSIS — U099 Post covid-19 condition, unspecified: Secondary | ICD-10-CM

## 2021-03-08 MED ORDER — PSEUDOEPH-BROMPHEN-DM 30-2-10 MG/5ML PO SYRP: 5.0000 mL | ORAL_SOLUTION | Freq: Four times a day (QID) | ORAL | 0 refills | Status: DC | PRN

## 2021-03-08 MED ORDER — ALBUTEROL SULFATE HFA 108 (90 BASE) MCG/ACT IN AERS: 2.0000 | INHALATION_SPRAY | Freq: Four times a day (QID) | RESPIRATORY_TRACT | 1 refills | Status: DC | PRN

## 2021-03-08 NOTE — Telephone Encounter (Signed)
Medications for cough sent

## 2021-03-09 ENCOUNTER — Other Ambulatory Visit: Payer: Self-pay | Admitting: Nurse Practitioner

## 2021-03-09 DIAGNOSIS — E1165 Type 2 diabetes mellitus with hyperglycemia: Secondary | ICD-10-CM

## 2021-03-09 MED ORDER — PROMETHAZINE-DM 6.25-15 MG/5ML PO SYRP: 5.0000 mL | ORAL_SOLUTION | Freq: Four times a day (QID) | ORAL | 0 refills | Status: DC | PRN

## 2021-03-09 MED ORDER — ALBUTEROL SULFATE HFA 108 (90 BASE) MCG/ACT IN AERS: 1.0000 | INHALATION_SPRAY | Freq: Four times a day (QID) | RESPIRATORY_TRACT | 1 refills | Status: DC | PRN

## 2021-03-09 NOTE — Telephone Encounter (Signed)
A1C is already ordered.

## 2021-03-31 ENCOUNTER — Encounter: Payer: Self-pay | Admitting: Nurse Practitioner

## 2021-04-27 ENCOUNTER — Encounter: Payer: Self-pay | Admitting: Nurse Practitioner

## 2021-04-27 ENCOUNTER — Other Ambulatory Visit: Payer: Self-pay

## 2021-04-27 ENCOUNTER — Ambulatory Visit: Payer: Self-pay | Attending: Nurse Practitioner | Admitting: Nurse Practitioner

## 2021-04-27 VITALS — BP 127/84 | HR 95 | Ht 61.0 in | Wt 208.1 lb

## 2021-04-27 DIAGNOSIS — D649 Anemia, unspecified: Secondary | ICD-10-CM

## 2021-04-27 DIAGNOSIS — Z23 Encounter for immunization: Secondary | ICD-10-CM

## 2021-04-27 DIAGNOSIS — I10 Essential (primary) hypertension: Secondary | ICD-10-CM

## 2021-04-27 DIAGNOSIS — Z1211 Encounter for screening for malignant neoplasm of colon: Secondary | ICD-10-CM

## 2021-04-27 DIAGNOSIS — E785 Hyperlipidemia, unspecified: Secondary | ICD-10-CM

## 2021-04-27 DIAGNOSIS — E1165 Type 2 diabetes mellitus with hyperglycemia: Secondary | ICD-10-CM

## 2021-04-27 LAB — POCT GLYCOSYLATED HEMOGLOBIN (HGB A1C): Hemoglobin A1C: 6.8 % — AB (ref 4.0–5.6)

## 2021-04-27 LAB — GLUCOSE, POCT (MANUAL RESULT ENTRY): POC Glucose: 133 mg/dl — AB (ref 70–99)

## 2021-04-27 MED ORDER — GLIMEPIRIDE 4 MG PO TABS: 2.0000 mg | ORAL_TABLET | Freq: Two times a day (BID) | ORAL | 1 refills | Status: DC

## 2021-04-27 MED ORDER — LISINOPRIL 10 MG PO TABS: 10.0000 mg | ORAL_TABLET | Freq: Every day | ORAL | 1 refills | Status: DC

## 2021-04-27 NOTE — Progress Notes (Signed)
Assessment & Plan:  [place] was seen today for diabetes.  Diagnoses and all orders for this visit:  Type 2 diabetes mellitus with hyperglycemia, without long-term current use of insulin (HCC) -     POCT glycosylated hemoglobin (Hb A1C) -     POCT glucose (manual entry) -     lisinopril (ZESTRIL) 10 MG tablet; Take 1 tablet (10 mg total) by mouth daily. For kidney protection with diabetes -     CMP14+EGFR -     glimepiride (AMARYL) 4 MG tablet; Take 0.5 tablets (2 mg total) by mouth 2 (two) times daily. PASS PROGRAM -     Ambulatory referral to Ophthalmology Continue blood sugar control as discussed in office today, low carbohydrate diet, and regular physical exercise as tolerated, 150 minutes per week (30 min each day, 5 days per week, or 50 min 3 days per week). Keep blood sugar logs with fasting goal of 90-130 mg/dl, post prandial (after you eat) less than 180.  For Hypoglycemia: BS <60 and Hyperglycemia BS >400; contact the clinic ASAP. Annual eye exams and foot exams are recommended.   Dyslipidemia, goal LDL below 100 -     Lipid panel  Colon cancer screening -     Fecal occult blood, imunochemical([facility])  Anemia, unspecified type -     CBC  Need for influenza vaccination -     Flu Vaccine QUAD 37moIM (Fluarix, Fluzone & Alfiuria Quad PF)  Primary hypertension -     lisinopril (ZESTRIL) 10 MG tablet; Take 1 tablet (10 mg total) by mouth daily. For kidney protection with diabetes Continue all antihypertensives as prescribed.  Remember to bring in your blood pressure log with you for your follow up appointment.  DASH/Mediterranean Diets are healthier choices for HTN.     Patient has been counseled on age-appropriate routine health concerns for screening and prevention. These are reviewed and up-to-date. Referrals have been placed accordingly. Immunizations are up-to-date or declined.    Subjective:   Chief Complaint  Patient presents with   Diabetes    HPI Amanda Dakota89y.o. female presents to office today for follow-up to diabetes and hypertension.  has a past medical history of Anemia, Anxiety, [place], [place]), Hyperlipidemia, Hypertension, and Vitamin D deficiency.     HTN Notes home readings averaging much lower than office readings 100/70s. She currently is only taking lisinopril 10 mg daily and has not taken amlodipine in several months.  At this time I will not resume her amlodipine and she will continue on lisinopril. BP Readings from Last 3 Encounters:  04/27/21 127/84  02/26/21 122/72  02/20/21 112/85     DM 2 Diabetes is well controlled with glimepiride 2 mg twice daily.  She has not taken Januvia in quite some time now.  We will continue her on glimepiride alone at this time.  She does note some hypoglycemia around 2 PM and I have encouraged her to include healthy midmorning snack between 11 and 1. Lab Results  Component Value Date   HGBA1C 6.8 (A) 04/27/2021    Review of Systems  Constitutional:  Negative for fever, malaise/fatigue and weight loss.  HENT: Negative.  Negative for nosebleeds.   Eyes: Negative.  Negative for blurred vision, double vision and photophobia.  Respiratory: Negative.  Negative for cough and shortness of breath.   Cardiovascular: Negative.  Negative for chest pain, palpitations and leg swelling.  Gastrointestinal: Negative.  Negative for heartburn, nausea and  vomiting.  Musculoskeletal: Negative.  Negative for myalgias.  Neurological: Negative.  Negative for dizziness, focal weakness, seizures and headaches.  Psychiatric/Behavioral: Negative.  Negative for suicidal ideas.    Past Medical History:  Diagnosis Date   Anemia    Anxiety    Diabetes ([place])    DVT (deep venous thrombosis) ([place]) 03/11/2012   Left popliteal    Hyperlipidemia    Hypertension    Vitamin D deficiency     Past Surgical History:  Procedure Laterality  Date   CESAREAN SECTION     1990, 1993.1994, 1996   TUBAL LIGATION      Family History  Problem Relation Age of Onset   Hypertension Father    Diabetes Father    Kidney failure Mother    Diabetes Sister    Colon cancer Neg Hx    Esophageal cancer Neg Hx    Stomach cancer Neg Hx    Rectal cancer Neg Hx     Social History Reviewed with no changes to be made today.   Outpatient Medications Prior to Visit  Medication Sig Dispense Refill   Blood Glucose Monitoring Suppl (ONETOUCH VERIO) w/Device KIT Use to check blood sugar once daily. E11.65 1 kit 0   glucose blood (ONETOUCH VERIO) test strip Use to check blood sugar once daily. E11.65 100 each 2   OneTouch Delica Lancets 41L MISC Use to check blood sugar once daily. E11.65 100 each 2   lisinopril (ZESTRIL) 10 MG tablet Take 1 tablet (10 mg total) by mouth daily. For kidney protection with diabetes 90 tablet 0   albuterol (PROVENTIL HFA) 108 (90 Base) MCG/ACT inhaler Inhale 1-2 puffs into the lungs every 6 (six) hours as needed for wheezing or shortness of breath. 18 g 1   atorvastatin (LIPITOR) 20 MG tablet Take 1 tablet (20 mg total) by mouth daily. NEEDS PASS 90 tablet 2   ferrous sulfate 325 (65 FE) MG tablet Take 1 tablet (325 mg total) by mouth 2 (two) times daily with a meal. (Patient not taking: Reported on 04/27/2021) 60 tablet 6   amLODipine (NORVASC) 10 MG tablet Take 1 tablet (10 mg total) by mouth daily. NEEDS PASS 90 tablet 1   benzonatate (TESSALON) 100 MG capsule Take 1 capsule (100 mg total) by mouth every 8 (eight) hours. 21 capsule 0   gabapentin (NEURONTIN) 600 MG tablet Take 1 tablet (600 mg total) by mouth at bedtime. 90 tablet 3   glimepiride (AMARYL) 4 MG tablet Take 0.5 tablets (2 mg total) by mouth 2 (two) times daily. PASS PROGRAM 90 tablet 1   HYDROcodone-acetaminophen (NORCO) 5-325 MG tablet Take 2 tablets by mouth every 6 (six) hours as needed for severe pain. (Patient not taking: Reported on 04/27/2021) 12  tablet 0   JANUVIA 100 MG tablet Take 100 mg by mouth daily.     loratadine (CLARITIN) 10 MG tablet Take 1 tablet (10 mg total) by mouth daily. 90 tablet 3   meloxicam (MOBIC) 15 MG tablet Take 1 tablet (15 mg total) by mouth daily. (Patient not taking: Reported on 04/27/2021) 30 tablet 0   PARoxetine (PAXIL) 10 MG tablet Take 1 tablet (10 mg total) by mouth daily. NEEDS PASS 30 tablet 1   promethazine-dextromethorphan (PROMETHAZINE-DM) 6.25-15 MG/5ML syrup Take 5 mLs by mouth 4 (four) times daily as needed for cough. 240 mL 0   dexamethasone (DECADRON) injection 4 mg      No facility-administered medications prior to visit.    No Known  Allergies     Objective:    BP 127/84    Pulse 95    Ht 5' 1" (1.549 m)    Wt 208 lb 2 oz (94.4 kg)    SpO2 100%    BMI 39.32 kg/m  Wt Readings from Last 3 Encounters:  04/27/21 208 lb 2 oz (94.4 kg)  02/20/21 210 lb (95.3 kg)  12/29/20 212 lb (96.2 kg)    Physical Exam Vitals and nursing note reviewed.  Constitutional:      Appearance: She is well-developed.  HENT:     Head: Normocephalic and atraumatic.  Cardiovascular:     Rate and Rhythm: Normal rate and regular rhythm.     Heart sounds: Normal heart sounds. No murmur heard.   No friction rub. No gallop.  Pulmonary:     Effort: Pulmonary effort is normal. No tachypnea or respiratory distress.     Breath sounds: Normal breath sounds. No decreased breath sounds, wheezing, rhonchi or rales.  Chest:     Chest wall: No tenderness.  Abdominal:     General: Bowel sounds are normal.     Palpations: Abdomen is soft.  Musculoskeletal:        General: Normal range of motion.     Cervical back: Normal range of motion.  Skin:    General: Skin is warm and dry.  Neurological:     Mental Status: She is alert and oriented to person, place, and time.     Coordination: Coordination normal.  Psychiatric:        Behavior: Behavior normal. Behavior is cooperative.        Thought Content: Thought  content normal.        Judgment: Judgment normal.         Patient has been counseled extensively about nutrition and exercise as well as the importance of adherence with medications and regular follow-up. The patient was given clear instructions to go to ER or return to medical center if symptoms don't improve, worsen or new problems develop. The patient verbalized understanding.   Follow-up: Return for PAP SMEAR.   [name], FNP-BC [facility] [place], [place]   04/27/2021, 10:14 AM

## 2021-04-28 ENCOUNTER — Encounter: Payer: Self-pay | Admitting: Nurse Practitioner

## 2021-04-28 LAB — CMP14+EGFR
ALT: 30 IU/L (ref 0–32)
AST: 32 IU/L (ref 0–40)
Albumin/Globulin Ratio: 1.5 (ref 1.2–2.2)
Albumin: 4.5 g/dL (ref 3.8–4.9)
Alkaline Phosphatase: 120 IU/L (ref 44–121)
BUN/Creatinine Ratio: 11 (ref 9–23)
BUN: 11 mg/dL (ref 6–24)
Bilirubin Total: 0.4 mg/dL (ref 0.0–1.2)
CO2: 23 mmol/L (ref 20–29)
Calcium: 9.6 mg/dL (ref 8.7–10.2)
Chloride: 101 mmol/L (ref 96–106)
Creatinine, Ser: 0.96 mg/dL (ref 0.57–1.00)
Globulin, Total: 3 g/dL (ref 1.5–4.5)
Glucose: 122 mg/dL — ABNORMAL HIGH (ref 70–99)
Potassium: 4.3 mmol/L (ref 3.5–5.2)
Sodium: 140 mmol/L (ref 134–144)
Total Protein: 7.5 g/dL (ref 6.0–8.5)
eGFR: 71 mL/min/{1.73_m2} (ref >59)

## 2021-04-28 LAB — LIPID PANEL
Chol/HDL Ratio: 3.8 ratio (ref 0.0–4.4)
Cholesterol, Total: 143 mg/dL (ref 100–199)
HDL: 38 mg/dL — ABNORMAL LOW (ref >39)
LDL Chol Calc (NIH): 88 mg/dL (ref 0–99)
Triglycerides: 88 mg/dL (ref 0–149)
VLDL Cholesterol Cal: 17 mg/dL (ref 5–40)

## 2021-04-28 LAB — CBC
Hematocrit: 44 % (ref 34.0–46.6)
Hemoglobin: 14.4 g/dL (ref 11.1–15.9)
MCH: 26.1 pg — ABNORMAL LOW (ref 26.6–33.0)
MCHC: 32.7 g/dL (ref 31.5–35.7)
MCV: 80 fL (ref 79–97)
Platelets: 293 10*3/uL (ref 150–450)
RBC: 5.51 x10E6/uL — ABNORMAL HIGH (ref 3.77–5.28)
RDW: 14.9 % (ref 11.7–15.4)
WBC: 5.1 10*3/uL (ref 3.4–10.8)

## 2021-05-10 ENCOUNTER — Other Ambulatory Visit: Payer: Self-pay

## 2021-06-17 ENCOUNTER — Other Ambulatory Visit: Payer: Self-pay

## 2021-06-17 ENCOUNTER — Emergency Department (HOSPITAL_BASED_OUTPATIENT_CLINIC_OR_DEPARTMENT_OTHER)
Admission: EM | Admit: 2021-06-17 | Discharge: 2021-06-17 | Disposition: A | Discharge status: Home / Self Care | Payer: 59 | Attending: Emergency Medicine | Admitting: Emergency Medicine

## 2021-06-17 ENCOUNTER — Encounter (HOSPITAL_BASED_OUTPATIENT_CLINIC_OR_DEPARTMENT_OTHER): Payer: Self-pay

## 2021-06-17 DIAGNOSIS — Z7984 Long term (current) use of oral hypoglycemic drugs: Secondary | ICD-10-CM | POA: Insufficient documentation

## 2021-06-17 DIAGNOSIS — E119 Type 2 diabetes mellitus without complications: Secondary | ICD-10-CM | POA: Insufficient documentation

## 2021-06-17 DIAGNOSIS — L02415 Cutaneous abscess of right lower limb: Secondary | ICD-10-CM | POA: Diagnosis not present

## 2021-06-17 MED ORDER — CEPHALEXIN 500 MG PO CAPS: 500.0000 mg | ORAL_CAPSULE | Freq: Four times a day (QID) | ORAL | 0 refills | Status: AC

## 2021-06-17 MED ORDER — CEPHALEXIN 250 MG PO CAPS
500.0000 mg | ORAL_CAPSULE | Freq: Once | ORAL | Status: AC
  Administered 2021-06-17: 500 mg via ORAL
  Filled 2021-06-17: qty 2

## 2021-06-17 NOTE — Discharge Instructions (Addendum)
It appears that you are developing an early abscess on the inside of your right thigh.  This happens often in this location given that it is a moist area with lots of friction which can increase risk of developing abscesses or infection.  Fortunately, it is quite early in the development of this abscess and does not need to be drained at this time.  I am sending you home with a course of antibiotics that you need to take 4 times daily for the next week.  Since your pharmacy is closed tonight, I have given you your first dose here in the emergency department.  You can also continue doing warm compresses at home.  To prevent abscesses from developing here in the future, attempt to keep the area clean and dry -avoid sitting in sweaty close for long periods of time.  For the scars from previous abscesses, you can try a cream called Mederma which helps with scars.  If your infection seems to be worsening after a few days on the antibiotics, you can follow-up with your primary care doctor for reevaluation.  If you start to develop fevers as well, please return to the ED. ?

## 2021-06-17 NOTE — ED Triage Notes (Signed)
C/o abscess to inner right thigh x several days. Denies discharge. ?

## 2021-06-17 NOTE — ED Provider Notes (Signed)
?[facility] EMERGENCY DEPARTMENT ?Provider Note ? ? ?CSN: [id number] ?Arrival date & time: 06/17/21  1756 ? ?  ? ?History ? ?Chief Complaint  ?Patient presents with  ? Abscess  ? ? ?Amanda Tran is a 56 y.o. female with history of diabetes who presents to the ED for evaluation of a suspected abscess on her inner right thigh that she first noticed yesterday.  Patient does have a history of abscesses in the past.  She has been doing warm compresses, however she wanted to come to the emergency department for evaluation and treatment before it worsened.  She has also been putting cocoa butter over the area in her previous scars.  She denies fever, chills.  She has no other complaints. ? ? ?Abscess ? ?  ? ?Home Medications ?Prior to Admission medications   ?Medication Sig Start Date End Date Taking? Authorizing Provider  ?cephALEXin (KEFLEX) 500 MG capsule Take 1 capsule (500 mg total) by mouth 4 (four) times daily for 7 days. 06/17/21 06/24/21 Yes Tonye Pearson, PA-C  ?albuterol (PROVENTIL HFA) 108 (90 Base) MCG/ACT inhaler Inhale 1-2 puffs into the lungs every 6 (six) hours as needed for wheezing or shortness of breath. 03/09/21 04/08/21  [name], NP  ?atorvastatin (LIPITOR) 20 MG tablet Take 1 tablet (20 mg total) by mouth daily. NEEDS PASS 12/04/20 03/04/21  [name], NP  ?Blood Glucose Monitoring Suppl (ONETOUCH VERIO) w/Device KIT Use to check blood sugar once daily. E11.65 05/26/20   Charlott Rakes, MD  ?ferrous sulfate 325 (65 FE) MG tablet Take 1 tablet (325 mg total) by mouth 2 (two) times daily with a meal. ?Patient not taking: Reported on 04/27/2021 08/09/19   [name], NP  ?glimepiride (AMARYL) 4 MG tablet Take 0.5 tablets (2 mg total) by mouth 2 (two) times daily. PASS PROGRAM 04/27/21 07/26/21  [name], NP  ?glucose blood (ONETOUCH VERIO) test strip Use to check blood sugar once daily. E11.65 05/26/20   Charlott Rakes, MD  ?lisinopril (ZESTRIL) 10 MG tablet Take 1  tablet (10 mg total) by mouth daily. For kidney protection with diabetes 04/27/21 07/26/21  [name], NP  ?OneTouch Delica Lancets 46N MISC Use to check blood sugar once daily. E11.65 05/26/20   Charlott Rakes, MD  ?   ? ?Allergies    ?Patient has no known allergies.   ? ?Review of Systems   ?Review of Systems ? ?Physical Exam ?Updated Vital Signs ?BP (!) 145/92 (BP Location: Left Arm)   Pulse 86   Temp 98 ?F (36.7 ?C) (Oral)   Resp 17   Ht $R'5\' 1"'DG$  (1.549 m)   Wt 95.3 kg   SpO2 99%   BMI 39.68 kg/m?  ?Physical Exam ?Vitals and nursing note reviewed.  ?Constitutional:   ?   General: She is not in acute distress. ?   Appearance: She is not ill-appearing.  ?HENT:  ?   Head: Atraumatic.  ?Eyes:  ?   Conjunctiva/sclera: Conjunctivae normal.  ?Cardiovascular:  ?   Rate and Rhythm: Normal rate and regular rhythm.  ?   Pulses: Normal pulses.  ?   Heart sounds: No murmur heard. ?Pulmonary:  ?   Effort: Pulmonary effort is normal. No respiratory distress.  ?   Breath sounds: Normal breath sounds.  ?Abdominal:  ?   General: Abdomen is flat. There is no distension.  ?   Palpations: Abdomen is soft.  ?   Tenderness: There is no abdominal tenderness.  ?  Musculoskeletal:     ?   General: Normal range of motion.  ?   Cervical back: Normal range of motion.  ?   Comments: 1 cm area of induration and erythema of proximal medial thigh without fluctuance or punctum  ?Skin: ?   General: Skin is warm and dry.  ?   Capillary Refill: Capillary refill takes less than 2 seconds.  ?Neurological:  ?   General: No focal deficit present.  ?   Mental Status: She is alert.  ?Psychiatric:     ?   Mood and Affect: Mood normal.  ? ? ?ED Results / Procedures / Treatments   ?Labs ?(all labs ordered are listed, but only abnormal results are displayed) ?Labs Reviewed - No data to display ? ?EKG ?None ? ?Radiology ?No results found. ? ?Procedures ?Procedures  ? ? ?Medications Ordered in ED ?Medications  ?cephALEXin (KEFLEX) capsule 500 mg (has no  administration in time range)  ? ? ?ED Course/ Medical Decision Making/ A&P ?  ?                        ?Medical Decision Making ?Risk ?Prescription drug management. ? ? ?History:  ?Per HPI ?Social determinants of health: n/a ? ?Initial impression: ? ?This patient presents to the ED for concern of abscess, this involves an extensive number of treatment options, and is a complaint that carries with it a high risk of complications and morbidity.    ? ? ?Medicines ordered and prescription drug management: ? ?I ordered medication including: ?Keflex 500mg    ?Reevaluation of the patient after these medicines showed that the patient stayed the same ?I have reviewed the patients home medicines and have made adjustments as needed ? ? ?ED Course: ?57 year old female in no acute distress, nontoxic-appearing presents with concern of early abscess.  She has history of these in the past and has been doing warm compresses and applying cocoa butter.  On exam, there is a 1 cm area of erythema and induration without fluctuance consistent with early abscess not amenable to drainage at this time.  I will instead send patient home with course of antibiotics.  Her pharmacy is currently closed so we will give her the first dose here in the emergency department.  Patient asks how she can prevent these in the future.  We discussed keeping the area clean and dry, not sitting in sweaty clothes or underwear, and advised her to forego using cocoa butter over that area as it can increase potential of clogging skin.   ? ?Disposition: ? ?After consideration of the diagnostic results, physical exam, history and the patients response to treatment feel that the patent would benefit from discharge.   ?Abscess of right thigh: Management and plan as described above.  All questions were asked and answered and patient was discharged home in good condition. ? ?Final Clinical Impression(s) / ED Diagnoses ?Final diagnoses:  ?Abscess of right thigh  ? ? ?Rx /  DC Orders ?ED Discharge Orders   ? ?      Ordered  ?  cephALEXin (KEFLEX) 500 MG capsule  4 times daily       ? 06/17/21 1843  ? ?  ?  ? ?  ? ? ?  ?Tonye Pearson, [place] ?06/17/21 1856 ? ?  ?Lucrezia Starch, MD ?06/17/21 2051 ? ?

## 2021-07-29 ENCOUNTER — Encounter (HOSPITAL_BASED_OUTPATIENT_CLINIC_OR_DEPARTMENT_OTHER): Payer: Self-pay | Admitting: Emergency Medicine

## 2021-07-29 ENCOUNTER — Emergency Department (HOSPITAL_BASED_OUTPATIENT_CLINIC_OR_DEPARTMENT_OTHER)
Admission: EM | Admit: 2021-07-29 | Discharge: 2021-07-29 | Disposition: A | Discharge status: Home / Self Care | Payer: 59 | Attending: Emergency Medicine | Admitting: Emergency Medicine

## 2021-07-29 ENCOUNTER — Other Ambulatory Visit: Payer: Self-pay

## 2021-07-29 ENCOUNTER — Telehealth (HOSPITAL_BASED_OUTPATIENT_CLINIC_OR_DEPARTMENT_OTHER): Payer: Self-pay | Admitting: Emergency Medicine

## 2021-07-29 DIAGNOSIS — N760 Acute vaginitis: Secondary | ICD-10-CM | POA: Diagnosis not present

## 2021-07-29 DIAGNOSIS — B9689 Other specified bacterial agents as the cause of diseases classified elsewhere: Secondary | ICD-10-CM | POA: Insufficient documentation

## 2021-07-29 DIAGNOSIS — Z79899 Other long term (current) drug therapy: Secondary | ICD-10-CM | POA: Insufficient documentation

## 2021-07-29 DIAGNOSIS — R21 Rash and other nonspecific skin eruption: Secondary | ICD-10-CM | POA: Insufficient documentation

## 2021-07-29 LAB — URINALYSIS, ROUTINE W REFLEX MICROSCOPIC
Bilirubin Urine: NEGATIVE
Glucose, UA: >=500 mg/dL — AB
Ketones, ur: NEGATIVE mg/dL
Leukocytes,Ua: NEGATIVE
Nitrite: NEGATIVE
Protein, ur: NEGATIVE mg/dL
Specific Gravity, Urine: 1.02 (ref 1.005–1.030)
pH: 5.5 (ref 5.0–8.0)

## 2021-07-29 LAB — URINALYSIS, MICROSCOPIC (REFLEX)

## 2021-07-29 LAB — WET PREP, GENITAL
Sperm: NONE SEEN
Trich, Wet Prep: NONE SEEN
WBC, Wet Prep HPF POC: <10 (ref <10)
Yeast Wet Prep HPF POC: NONE SEEN

## 2021-07-29 MED ORDER — HYDROCORTISONE 1 % EX CREA: TOPICAL_CREAM | CUTANEOUS | 0 refills | Status: DC

## 2021-07-29 MED ORDER — CLINDAMYCIN PHOSPHATE 2 % VA CREA: 1.0000 | TOPICAL_CREAM | Freq: Every day | VAGINAL | 0 refills | Status: DC

## 2021-07-29 MED ORDER — METRONIDAZOLE 500 MG PO TABS: 500.0000 mg | ORAL_TABLET | Freq: Two times a day (BID) | ORAL | 0 refills | Status: DC

## 2021-07-29 NOTE — ED Provider Notes (Signed)
?[facility] EMERGENCY DEPARTMENT ?Provider Note ? ? ?CSN: [id number] ?Arrival date & time: 07/29/21  1404 ? ?  ? ?History ? ?Chief Complaint  ?Patient presents with  ? Vaginal Itching  ? Rash  ? ? ?Amanda Tran is a 56 y.o. female. ? ?56 year old female presents today for evaluation of generalized rash which has been present for 2 weeks.  This has been unchanged since onset.  She has not tried anything for this prior to arrival.  She also endorses vaginal itching.  She endorses recent unprotected sex.  She denies dysuria, vaginal discharge.  She is without abdominal pain. ? ?The history is provided by the patient. No language interpreter was used.  ? ?  ? ?Home Medications ?Prior to Admission medications   ?Medication Sig Start Date End Date Taking? Authorizing Provider  ?albuterol (PROVENTIL HFA) 108 (90 Base) MCG/ACT inhaler Inhale 1-2 puffs into the lungs every 6 (six) hours as needed for wheezing or shortness of breath. 03/09/21 04/08/21  [name], NP  ?atorvastatin (LIPITOR) 20 MG tablet Take 1 tablet (20 mg total) by mouth daily. NEEDS PASS 12/04/20 03/04/21  [name], NP  ?Blood Glucose Monitoring Suppl (ONETOUCH VERIO) w/Device KIT Use to check blood sugar once daily. E11.65 05/26/20   Charlott Rakes, MD  ?ferrous sulfate 325 (65 FE) MG tablet Take 1 tablet (325 mg total) by mouth 2 (two) times daily with a meal. ?Patient not taking: Reported on 04/27/2021 08/09/19   [name], NP  ?glimepiride (AMARYL) 4 MG tablet Take 0.5 tablets (2 mg total) by mouth 2 (two) times daily. PASS PROGRAM 04/27/21 07/26/21  [name], NP  ?glucose blood (ONETOUCH VERIO) test strip Use to check blood sugar once daily. E11.65 05/26/20   Charlott Rakes, MD  ?lisinopril (ZESTRIL) 10 MG tablet Take 1 tablet (10 mg total) by mouth daily. For kidney protection with diabetes 04/27/21 07/26/21  [name], NP  ?OneTouch Delica Lancets 57Q MISC Use to check blood sugar once daily. E11.65 05/26/20    Charlott Rakes, MD  ?   ? ?Allergies    ?Patient has no known allergies.   ? ?Review of Systems   ?Review of Systems  ?Constitutional:  Negative for chills and fever.  ?Gastrointestinal:  Negative for abdominal pain.  ?Genitourinary:  Negative for dysuria, vaginal bleeding, vaginal discharge and vaginal pain.  ?Skin:  Positive for rash.  ?All other systems reviewed and are negative. ? ?Physical Exam ?Updated Vital Signs ?BP (!) 127/94 (BP Location: Left Arm)   Pulse 93   Temp 98.5 ?F (36.9 ?C) (Oral)   Resp 17   Ht _0  (1.549 m)   Wt 93 kg   SpO2 99%   BMI 38.73 kg/m?  ?Physical Exam ?Vitals and nursing note reviewed. Exam conducted with a chaperone present.  ?Constitutional:   ?   General: She is not in acute distress. ?   Appearance: Normal appearance. She is not ill-appearing.  ?HENT:  ?   Head: Normocephalic and atraumatic.  ?   Nose: Nose normal.  ?Eyes:  ?   Conjunctiva/sclera: Conjunctivae normal.  ?Cardiovascular:  ?   Rate and Rhythm: Normal rate and regular rhythm.  ?Pulmonary:  ?   Effort: Pulmonary effort is normal. No respiratory distress.  ?Abdominal:  ?   General: There is no distension.  ?   Palpations: Abdomen is soft.  ?   Tenderness: There is no abdominal tenderness. There is no guarding.  ?Genitourinary: ?  Vagina: No vaginal discharge or bleeding.  ?   Cervix: No cervical motion tenderness or erythema.  ?Musculoskeletal:     ?   General: No deformity.  ?Skin: ?   Findings: No rash.  ?Neurological:  ?   Mental Status: She is alert.  ? ? ?ED Results / Procedures / Treatments   ?Labs ?(all labs ordered are listed, but only abnormal results are displayed) ?Labs Reviewed  ?WET PREP, GENITAL  ?URINALYSIS, ROUTINE W REFLEX MICROSCOPIC  ?GC/CHLAMYDIA PROBE AMP ([place]) NOT AT [facility]  ? ? ?EKG ?None ? ?Radiology ?No results found. ? ?Procedures ?Procedures  ? ? ?Medications Ordered in ED ?Medications - No data to display ? ?ED Course/ Medical Decision Making/ A&P ?  ?                         ?Medical Decision Making ?Amount and/or Complexity of Data Reviewed ?Labs: ordered. ? ? ?56 year old female presents today for generalized rash, and vaginal itching.  Patient endorses recent unprotected sex.  She denies vaginal discharge, dysuria, pelvic pain.  Pelvic exam done with chaperone present.  Gonorrhea and Chlamydia swab sent.  Wet prep shows clue cells concerning for bacterial vaginosis.  Will treat with Flagyl.  We will provide patient with hydrocortisone cream for her rash.  Does not appear to be allergic in nature.  Does not appear consistent with an erythematous rash.  We will provide her hydrocortisone cream to use as needed and follow-up with PCP.  Patient voices understanding and is in agreement with plan.  Patient does have glucosuria however she is diabetic and takes glimepiride. ? ? ?Final Clinical Impression(s) / ED Diagnoses ?Final diagnoses:  ?BV (bacterial vaginosis)  ?Rash  ? ? ?Rx / DC Orders ?ED Discharge Orders   ? ?      Ordered  ?  hydrocortisone cream 1 %       ? 07/29/21 1612  ?  metroNIDAZOLE (FLAGYL) 500 MG tablet  2 times daily       ? 07/29/21 1612  ? ?  ?  ? ?  ? ? ?  ?Evlyn Courier, PA-C ?07/29/21 1612 ? ?  ?Hayden Rasmussen, MD ?07/30/21 1049 ? ?

## 2021-07-29 NOTE — ED Notes (Signed)
Pt manually checked blood glucose, meter read 226. RN notified.  ?

## 2021-07-29 NOTE — Telephone Encounter (Signed)
Patient calls back stating she cannot tolerate Flagyl and request a different antibiotic.  Clindamycin cream sent in. ?

## 2021-07-29 NOTE — ED Notes (Signed)
Pt NAD, a/ox4. Pt verbalizes understanding of all DC and f/u instructions. All questions answered. Pt walks with steady gait to lobby at DC.  ? ?

## 2021-07-29 NOTE — ED Triage Notes (Signed)
Pt arrives pov with c/o rash bilaterally on arms and legs and vaginal irritation, denies d/c, denies fever ?

## 2021-07-30 ENCOUNTER — Encounter: Payer: Self-pay | Admitting: Nurse Practitioner

## 2021-07-30 LAB — GC/CHLAMYDIA PROBE AMP (~~LOC~~) NOT AT ARMC
Chlamydia: NEGATIVE
Comment: NEGATIVE
Comment: NORMAL
Neisseria Gonorrhea: NEGATIVE

## 2021-07-31 ENCOUNTER — Telehealth: Payer: 59 | Admitting: Nurse Practitioner

## 2021-07-31 ENCOUNTER — Other Ambulatory Visit: Payer: Self-pay | Admitting: Nurse Practitioner

## 2021-07-31 ENCOUNTER — Encounter: Payer: Self-pay | Admitting: Nurse Practitioner

## 2021-07-31 ENCOUNTER — Ambulatory Visit (HOSPITAL_BASED_OUTPATIENT_CLINIC_OR_DEPARTMENT_OTHER): Payer: 59 | Admitting: Nurse Practitioner

## 2021-07-31 DIAGNOSIS — E1165 Type 2 diabetes mellitus with hyperglycemia: Secondary | ICD-10-CM

## 2021-07-31 DIAGNOSIS — N760 Acute vaginitis: Secondary | ICD-10-CM

## 2021-07-31 MED ORDER — METRONIDAZOLE 0.75 % VA GEL: 1.0000 | Freq: Two times a day (BID) | VAGINAL | 0 refills | Status: DC

## 2021-07-31 MED ORDER — METFORMIN HCL 500 MG PO TABS: 500.0000 mg | ORAL_TABLET | Freq: Every day | ORAL | 0 refills | Status: DC

## 2021-07-31 MED ORDER — GLIMEPIRIDE 4 MG PO TABS: 8.0000 mg | ORAL_TABLET | Freq: Every day | ORAL | 0 refills | Status: DC

## 2021-07-31 NOTE — Progress Notes (Signed)
Virtual Visit via Telephone Note ? I discussed the limitations, risks, security and privacy concerns of performing an evaluation and management service by telephone and the availability of in person appointments. I also discussed with the patient that there may be a patient responsible charge related to this service. The patient expressed understanding and agreed to proceed.  ? ? ?I connected with Amanda Tran on 07/31/21  at   1:30 PM EDT  EDT by telephone and verified that I am speaking with the correct person using two identifiers. ? ?Location of Patient: ?Private Residence ?  ?Location of Provider: ?Scientist, research (physical sciences) and [employer] Office  ?  ?Persons participating in Telemedicine visit: ?[name] FNP-BC ?[place]  ?  ?History of Present Illness: ?Telemedicine visit for: DM  ? ?Notes elevated morning Blood glucose levels as high as 200s despite taking 8 mg of glimepiride daily. State she has been under a lot of stress and not eating as well as she should be in regard to low carb low fat.  ?Her grandson just went into foster care and she is also having car trouble.  ?Lab Results  ?Component Value Date  ? HGBA1C 6.8 (A) 04/27/2021  ?  ? ?Past Medical History:  ?Diagnosis Date  ? Anemia   ? Anxiety   ? Diabetes ([place])   ? DVT (deep venous thrombosis) ([place]) 03/11/2012  ? Left popliteal   ? Hyperlipidemia   ? Hypertension   ? Vitamin D deficiency   ?  ?Past Surgical History:  ?Procedure Laterality Date  ? [place], [id number], 1996  ? TUBAL LIGATION    ?  ?Family History  ?Problem Relation Age of Onset  ? Hypertension Father   ? Diabetes Father   ? Kidney failure Mother   ? Diabetes Sister   ? Colon cancer Neg Hx   ? Esophageal cancer Neg Hx   ? Stomach cancer Neg Hx   ? Rectal cancer Neg Hx   ?  ?Social History  ? ?Socioeconomic History  ? Marital status: Divorced  ?  Spouse name: Not on file  ? Number of children: 5  ? Years of education: Not on file  ? Highest  education level: High school graduate  ?Occupational History  ? Not on file  ?Tobacco Use  ? Smoking status: Former  ?  Packs/day: 0.50  ?  Years: 25.00  ?  Pack years: 12.50  ?  Types: Cigarettes  ?  Quit date: 09/21/2017  ?  Years since quitting: 3.8  ? Smokeless tobacco: Never  ?Vaping Use  ? Vaping Use: Never used  ?Substance and Sexual Activity  ? Alcohol use: Yes  ?  Comment: occasional  ? Drug use: No  ? Sexual activity: Not Currently  ?  Birth control/protection: Condom, Surgical  ?  Comment: 1st intercourse 56 yo-More than 5 partners-BTL  ?Other Topics Concern  ? Not on file  ?Social History Narrative  ? Not on file  ? ?Social Determinants of Health  ? ?Financial Resource Strain: Not on file  ?Food Insecurity: Not on file  ?Transportation Needs: Not on file  ?Physical Activity: Not on file  ?Stress: Not on file  ?Social Connections: Not on file  ?  ? ?Observations/Objective: ?Awake, alert and oriented x 3 ? ? ?Review of Systems  ?Constitutional:  Negative for fever, malaise/fatigue and weight loss.  ?HENT: Negative.  Negative for nosebleeds.   ?Eyes: Negative.  Negative for blurred vision, double vision  and photophobia.  ?Respiratory: Negative.  Negative for cough and shortness of breath.   ?Cardiovascular: Negative.  Negative for chest pain, palpitations and leg swelling.  ?Gastrointestinal: Negative.  Negative for heartburn, nausea and vomiting.  ?Musculoskeletal: Negative.  Negative for myalgias.  ?Neurological: Negative.  Negative for dizziness, focal weakness, seizures and headaches.  ?Psychiatric/Behavioral: Negative.  Negative for suicidal ideas.    ?Assessment and Plan: ?Diagnoses and all orders for this visit: ? ?Type 2 diabetes mellitus with hyperglycemia, without long-term current use of insulin (HCC) ?-     glimepiride (AMARYL) 4 MG tablet; Take 2 tablets (8 mg total) by mouth daily with breakfast. ?ADDING TODAY-     metFORMIN (GLUCOPHAGE) 500 MG tablet; Take 1 tablet (500 mg total) by mouth  daily with breakfast. ? ?  ? ?Follow Up Instructions ?Return in about 3 months (around 10/31/2021).  ? ?  ?I discussed the assessment and treatment plan with the patient. The patient was provided an opportunity to ask questions and all were answered. The patient agreed with the plan and demonstrated an understanding of the instructions. ?  ?The patient was advised to call back or seek an in-person evaluation if the symptoms worsen or if the condition fails to improve as anticipated. ? ?I provided 11 minutes of non-face-to-face time during this encounter including median intraservice time, reviewing previous notes, labs, imaging, medications and explaining diagnosis and management. ? ?[name], FNP-BC  ?

## 2021-08-02 ENCOUNTER — Encounter: Payer: Self-pay | Admitting: Nurse Practitioner

## 2021-08-21 ENCOUNTER — Ambulatory Visit: Payer: 59 | Admitting: Physician Assistant

## 2021-08-21 ENCOUNTER — Ambulatory Visit: Payer: Self-pay | Admitting: *Deleted

## 2021-08-21 ENCOUNTER — Encounter: Payer: Self-pay | Admitting: Physician Assistant

## 2021-08-21 DIAGNOSIS — L309 Dermatitis, unspecified: Secondary | ICD-10-CM

## 2021-08-21 MED ORDER — TRIAMCINOLONE ACETONIDE 0.1 % EX CREA: TOPICAL_CREAM | Freq: Every day | CUTANEOUS | 0 refills | Status: DC

## 2021-08-21 NOTE — Telephone Encounter (Signed)
   Per agent: "Pt called to report that she is going to the [facility], she has what she believes is ringworm. She says she has had this for months"     Chief Complaint: "Ringworm" Symptoms: It's all over , my grandson had it and slept with me, got it shortly after in Feb. Keeps coming back." Frequency: February Pertinent Negatives: Patient denies fever, pain Disposition: '[]'$ ED /'[]'$ Urgent Care (no appt availability in office) / '[]'$ Appointment(In office/virtual)/ '[]'$  [facility]/ '[]'$ Home Care/ '[]'$ Refused Recommended Disposition /'[x]'$ [place]/ '[]'$  Follow-up with PCP Additional Notes: Pt states she is going to the [employer].   Reason for Disposition  [1] On treatment > 3 weeks AND [2] rash is not gone  Answer Assessment - Initial Assessment Questions 1. APPEARANCE of RASH: "What does the rash look like?"      Ringworm 2. LOCATION: "Where is the rash located?"      Arms, back 3. SIZE: "How large are the spots?"      Vary 4. NUMBER: "How many spots are there?"      Too many to count 5. ONSET: "When did the ringworm start?   February, worsening     6. OTHER SYMPTOMS: "Do you have any other symptoms?" (e.g., fever, headache, etc.)     No  Protocols used: Ringworm-A-AH

## 2021-08-21 NOTE — Patient Instructions (Signed)
You are going to use the Kenalog triamcinolone cream mixture once daily until resolved.  Please let us know the thing else we can do for you.  Kennieth Rad, PA-C Physician Assistant [facility] [email or web address]   Atopic Dermatitis Atopic dermatitis is a skin disorder that causes inflammation of the skin. It is marked by a red rash and itchy, dry, scaly skin. It is the most common type of eczema. Eczema is a group of skin conditions that cause the skin to become rough and swollen. This condition is generally worse during the cooler winter months and often improves during the warm summer months. Atopic dermatitis usually starts showing signs in infancy and can last through adulthood. This condition cannot be passed from one person to another (is not contagious). Atopic dermatitis may not always be present, but when it is, it is called a flare-up. What are the causes? The exact cause of this condition is not known. Flare-ups may be triggered by: Coming in contact with something that you are sensitive or allergic to (allergen). Stress. Certain foods. Extremely hot or cold weather. Harsh chemicals and soaps. Dry air. Chlorine. What increases the risk? This condition is more likely to develop in people who have a personal or family history of: Eczema. Allergies. Asthma. Hay fever. What are the signs or symptoms? Symptoms of this condition include: Dry, scaly skin. Red, itchy rash. Itchiness, which can be severe. This may occur before the skin rash. This can make sleeping difficult. Skin thickening and cracking that can occur over time. How is this diagnosed? This condition is diagnosed based on: Your symptoms. Your medical history. A physical exam. How is this treated? There is no cure for this condition, but symptoms can usually be controlled. Treatment focuses on: Controlling the itchiness and scratching. You may be  given medicines, such as antihistamines or steroid creams. Limiting exposure to allergens. Recognizing situations that cause stress and developing a plan to manage stress. If your atopic dermatitis does not get better with medicines, or if it is all over your body (widespread), a treatment using a specific type of light (phototherapy) may be used. Follow these instructions at home: Skin care  Keep your skin well moisturized. Doing this seals in moisture and helps to prevent dryness. Use unscented lotions that have petroleum in them. Avoid lotions that contain alcohol or water. They can dry the skin. Keep baths or showers short (less than 5 minutes) in warm water. Do not use hot water. Use mild, unscented cleansers for bathing. Avoid soap and bubble bath. Apply a moisturizer to your skin right after a bath or shower. Do not apply anything to your skin without checking with your health care provider. General instructions Take or apply over-the-counter and prescription medicines only as told by your health care provider. Dress in clothes made of cotton or cotton blends. Dress lightly because heat increases itchiness. When washing your clothes, rinse your clothes twice so all of the soap is removed. Avoid any triggers that can cause a flare-up. Keep your fingernails cut short. Avoid scratching. Scratching makes the rash and itchiness worse. A break in the skin from scratching could result in a skin infection (impetigo). Do not be around people who have cold sores or fever blisters. If you get the infection, it may cause your atopic dermatitis to worsen. Keep all follow-up visits. This is important. Contact a health care provider if: Your itchiness interferes with sleep. Your rash gets worse or is not better within  one week of starting treatment. You have a fever. You have a rash flare-up after having contact with someone who has cold sores or fever blisters. Get help right away if: You  develop pus or soft yellow scabs in the rash area. Summary Atopic dermatitis causes a red rash and itchy, dry, scaly skin. Treatment focuses on controlling the itchiness and scratching, limiting exposure to things that you are sensitive or allergic to (allergens), recognizing situations that cause stress, and developing a plan to manage stress. Keep your skin well moisturized. Keep baths or showers shorter than 5 minutes and use warm water. Do not use hot water. This information is not intended to replace advice given to you by your health care provider. Make sure you discuss any questions you have with your health care provider. Document Revised: 12/20/2019 Document Reviewed: 12/20/2019 Elsevier Patient Education  [place].

## 2021-08-21 NOTE — Progress Notes (Signed)
Patient noticed "rash" on her L arm back in February after keeping her grandson who was sleeping in the bed with her. Patient reports patches on the other arm and on her shoulder, her back. Patient used nystatin ointment- [facility]-

## 2021-08-21 NOTE — Progress Notes (Signed)
Established Patient Office Visit  Subjective   Patient ID: Amanda Tran, female    DOB: [birth year]  Age: 56 y.o. MRN: [id number]  Chief Complaint  Patient presents with   Recurrent Skin Infections    Virtual Visit via Telephone Note  I connected with Amanda Tran on 08/21/21 at  2:20 PM EDT by telephone and verified that I am speaking with the correct person using two identifiers.  Location: Patient: Home  Provider: [facility] Medicine Unit    I discussed the limitations, risks, security and privacy concerns of performing an evaluation and management service by telephone and the availability of in person appointments. I also discussed with the patient that there may be a patient responsible charge related to this service. The patient expressed understanding and agreed to proceed.   History of Present Illness: States that she noticed small  raised bumps on her left arm in February, and states that they are spreading onto her right arm, her back, her legs and her stomach.  States that she has tried nystatin cream with a small amount of relief.  Describes them as itchy.  States that she was exposed to ringworm at the beginning of February.  Denies any new detergents, lotions, body washes, fragrances.   Observations/Objective: Medical history and current medications reviewed, no physical exam completed   HPI  Past Medical History:  Diagnosis Date   Anemia    Anxiety    Diabetes ([place])    DVT (deep venous thrombosis) ([place]) 03/11/2012   Left popliteal    Hyperlipidemia    Hypertension    Vitamin D deficiency    Social History   Socioeconomic History   Marital status: Divorced    Spouse name: Not on file   Number of children: 5   Years of education: Not on file   Highest education level: High school graduate  Occupational History   Not on file  Tobacco Use   Smoking status: Former    Packs/day: 0.50    Years: 25.00    Pack years: 12.50     Types: Cigarettes    Quit date: 09/21/2017    Years since quitting: 3.9   Smokeless tobacco: Never  Vaping Use   Vaping Use: Never used  Substance and Sexual Activity   Alcohol use: Yes    Comment: occasional   Drug use: No   Sexual activity: Not Currently    Birth control/protection: Condom, Surgical    Comment: 1st intercourse 56 yo-More than 5 partners-BTL  Other Topics Concern   Not on file  Social History Narrative   Not on file   Social Determinants of Health   Financial Resource Strain: Not on file  Food Insecurity: Not on file  Transportation Needs: Not on file  Physical Activity: Not on file  Stress: Not on file  Social Connections: Not on file  Intimate Partner Violence: Not on file   Family History  Problem Relation Age of Onset   Hypertension Father    Diabetes Father    Kidney failure Mother    Diabetes Sister    Colon cancer Neg Hx    Esophageal cancer Neg Hx    Stomach cancer Neg Hx    Rectal cancer Neg Hx    No Known Allergies    Review of Systems  Constitutional:  Negative for chills and fever.  HENT: Negative.    Eyes: Negative.   Respiratory:  Negative for shortness of breath.   Cardiovascular:  Negative for chest pain.  Gastrointestinal: Negative.   Genitourinary: Negative.   Musculoskeletal: Negative.   Skin:  Positive for itching and rash.  Neurological: Negative.   Endo/Heme/Allergies: Negative.   Psychiatric/Behavioral: Negative.       Objective:     There were no vitals taken for this visit.    Assessment & Plan:   Problem List Items Addressed This Visit   None Visit Diagnoses     Dermatitis    -  Primary   Relevant Medications   ketoconazole 2%-triamcinolone 0.1% 1:2 cream mixture       Assessment and Plan: 1. Dermatitis Trial ketoconazole, triamcinolone.  Patient education given on supportive care.  Red flags given for prompt reevaluation. - ketoconazole 2%-triamcinolone 0.1% 1:2 cream mixture; Apply topically  daily. Until resolved  Dispense: 45 g; Refill: 0   Follow Up Instructions:    I discussed the assessment and treatment plan with the patient. The patient was provided an opportunity to ask questions and all were answered. The patient agreed with the plan and demonstrated an understanding of the instructions.   The patient was advised to call back or seek an in-person evaluation if the symptoms worsen or if the condition fails to improve as anticipated.  I provided 12 minutes of non-face-to-face time during this encounter.   Return if symptoms worsen or fail to improve.    Loraine Grip Mayers, PA-C

## 2021-08-23 ENCOUNTER — Encounter: Payer: Self-pay | Admitting: Nurse Practitioner

## 2021-08-27 ENCOUNTER — Ambulatory Visit: Payer: 59 | Attending: Nurse Practitioner

## 2021-08-27 DIAGNOSIS — Z111 Encounter for screening for respiratory tuberculosis: Secondary | ICD-10-CM

## 2021-08-27 NOTE — Progress Notes (Signed)
Pt arrived for PPD testing PPD placed in left fore arm Pt to return in 48-72 hours.

## 2021-08-29 ENCOUNTER — Other Ambulatory Visit: Payer: Self-pay

## 2021-08-29 ENCOUNTER — Other Ambulatory Visit (HOSPITAL_BASED_OUTPATIENT_CLINIC_OR_DEPARTMENT_OTHER): Payer: Self-pay

## 2021-08-29 ENCOUNTER — Emergency Department (HOSPITAL_BASED_OUTPATIENT_CLINIC_OR_DEPARTMENT_OTHER)
Admission: EM | Admit: 2021-08-29 | Discharge: 2021-08-29 | Disposition: A | Discharge status: Home / Self Care | Payer: 59 | Attending: Emergency Medicine | Admitting: Emergency Medicine

## 2021-08-29 ENCOUNTER — Telehealth: Payer: Self-pay | Admitting: Nurse Practitioner

## 2021-08-29 ENCOUNTER — Encounter (HOSPITAL_BASED_OUTPATIENT_CLINIC_OR_DEPARTMENT_OTHER): Payer: Self-pay

## 2021-08-29 ENCOUNTER — Ambulatory Visit: Payer: 59 | Attending: Nurse Practitioner

## 2021-08-29 ENCOUNTER — Encounter: Payer: Self-pay | Admitting: Nurse Practitioner

## 2021-08-29 DIAGNOSIS — Z7984 Long term (current) use of oral hypoglycemic drugs: Secondary | ICD-10-CM | POA: Diagnosis not present

## 2021-08-29 DIAGNOSIS — R21 Rash and other nonspecific skin eruption: Secondary | ICD-10-CM | POA: Diagnosis not present

## 2021-08-29 LAB — TB SKIN TEST
Induration: 0 mm
TB Skin Test: NEGATIVE

## 2021-08-29 MED ORDER — NYSTATIN-TRIAMCINOLONE 100000-0.1 UNIT/GM-% EX CREA
TOPICAL_CREAM | CUTANEOUS | 0 refills | Status: DC
  Filled 2021-08-29: qty 30, 7d supply, fill #0

## 2021-08-29 NOTE — ED Provider Notes (Signed)
[facility] EMERGENCY DEPT Provider Note   CSN: [id number] Arrival date & time: 08/29/21  1100     History  Chief Complaint  Patient presents with   [place] is a 56 y.o. female with chief complaint of bodily rash since February.  First appeared on one of the arms, now is on the other arm and her abdomen.  Has seen remote primary care and internal medicine regarding this issue.  Was started on a nystatin topical, and has noticed some improvement.  States the rash is sometimes itchy, but denies bleeding, discharge, headaches, fevers, vision changes, palpitations, shortness of breath, chest pain.  Denies skin peeling, or lesions in the mouth.  Has not been around anyone else with similar symptoms.  No new recent lotions, body washes, or medications.  The history is provided by the patient and medical records.  Rash     Home Medications Prior to Admission medications   Medication Sig Start Date End Date Taking? Authorizing Provider  nystatin-triamcinolone (MYCOLOG II) cream Apply to affected area daily 08/29/21  Yes Prince Rome, PA-C  atorvastatin (LIPITOR) 20 MG tablet Take 1 tablet (20 mg total) by mouth daily. NEEDS PASS 12/04/20 03/04/21  [name], NP  Blood Glucose Monitoring Suppl (ONETOUCH VERIO) w/Device KIT Use to check blood sugar once daily. E11.65 05/26/20   Charlott Rakes, MD  glimepiride (AMARYL) 4 MG tablet Take 2 tablets (8 mg total) by mouth daily with breakfast. 07/31/21 10/29/21  [name], NP  glucose blood (ONETOUCH VERIO) test strip Use to check blood sugar once daily. E11.65 05/26/20   Charlott Rakes, MD  lisinopril (ZESTRIL) 10 MG tablet Take 1 tablet (10 mg total) by mouth daily. For kidney protection with diabetes 04/27/21 07/26/21  [name], NP  metFORMIN (GLUCOPHAGE) 500 MG tablet Take 1 tablet (500 mg total) by mouth daily with breakfast. 07/31/21 10/29/21  [name], NP  metroNIDAZOLE (METROGEL VAGINAL)  0.75 % vaginal gel Place 1 Applicatorful vaginally 2 (two) times daily. 07/31/21   [name], NP  OneTouch Delica Lancets 97W MISC Use to check blood sugar once daily. E11.65 05/26/20   Charlott Rakes, MD      Allergies    Patient has no known allergies.    Review of Systems   Review of Systems  Skin:  Positive for rash.   Physical Exam Updated Vital Signs BP 128/87 (BP Location: Right Arm)   Pulse 84   Temp 99 F (37.2 C) (Oral)   Resp 18   Ht $R'5\' 1"'HX$  (1.549 m)   Wt 94.3 kg   SpO2 100%   BMI 39.30 kg/m  Physical Exam Vitals and nursing note reviewed.  Constitutional:      General: She is not in acute distress.    Appearance: Normal appearance. She is well-developed. She is not ill-appearing or diaphoretic.  HENT:     Head: Normocephalic and atraumatic.  Eyes:     Conjunctiva/sclera: Conjunctivae normal.  Cardiovascular:     Rate and Rhythm: Normal rate and regular rhythm.     Pulses: Normal pulses.     Heart sounds: Normal heart sounds. No murmur heard. Pulmonary:     Effort: Pulmonary effort is normal. No respiratory distress.     Breath sounds: Normal breath sounds. No wheezing.  Chest:     Chest wall: No tenderness.  Abdominal:     General: Bowel sounds are normal.     Palpations: Abdomen  is soft.     Tenderness: There is no abdominal tenderness.  Musculoskeletal:        General: No swelling.     Cervical back: Neck supple.  Skin:    General: Skin is warm and dry.     Capillary Refill: Capillary refill takes less than 2 seconds.     Findings: Rash present.     Comments: Superficial 1-2 mm small flesh-colored raised lesions in the same anatomical location of the hair follicles identified on exam, of the right and left arms and a small circular area of the abdomen.  They are not vesicular, crusting, nodular, scaling, purpuric, or urticarial.  No evidence of ecchymosis.  Appear to be fading.  Neurological:     Mental Status: She is alert and oriented to  person, place, and time.  Psychiatric:        Mood and Affect: Mood normal.    ED Results / Procedures / Treatments   Labs (all labs ordered are listed, but only abnormal results are displayed) Labs Reviewed - No data to display  EKG None  Radiology No results found.  Procedures Procedures    Medications Ordered in ED Medications - No data to display  ED Course/ Medical Decision Making/ A&P                           Medical Decision Making Amount and/or Complexity of Data Reviewed External Data Reviewed: notes. Labs: ordered. Decision-making details documented in ED Course. Radiology: ordered and independent interpretation performed. Decision-making details documented in ED Course. ECG/medicine tests: ordered and independent interpretation performed. Decision-making details documented in ED Course.  Risk OTC drugs. Prescription drug management.   56 y.o. female presents to the ED for concern of Rash   This involves an extensive number of treatment options, and is a complaint that carries with it a high risk of complications and morbidity.    Additional History:  Internal and external records from outside source obtained and reviewed including internal medicine and family medicine  Physical Exam: Physical exam performed. The pertinent findings include: Superficial 1-2 mm small flesh-colored raised lesions in the same anatomical location of the hair follicles  Lab Tests: None  Imaging Studies: None  Medications: I have reviewed the patients home medicines and have made adjustments as needed  ED Course/Disposition: Pt well-appearing on exam.  Presenting with fading rash of the upper extremities and a small section of the abdomen.  Pt notes improvement with nystatin-triamcinolone provided by PCP.  Rash suggestive of keratosis pilaris. Patient denies any difficulty breathing or swallowing.  Pt has a patent airway without stridor and is handling secretions without  difficulty; no angioedema.  No blisters, no pustules, no warmth, no draining sinus tracts, no superficial abscesses, no bullous impetigo, no vesicles, no desquamation, no target lesions with dusky purpura or a central bulla. Not tender to touch.  No concern for superimposed infection.  No concern for SJS, TEN, TSS, tick borne illness, syphilis or other life-threatening condition. Will discharge home with continuation of nystatin-triamcinolone, recommendation for close PCP follow up, and recommend Benadryl as needed for pruritis.  After consideration of the patient's encounter today, I feel that the emergency department workup does not suggest an emergent condition requiring admission or immediate intervention beyond what has been performed at this time.  The patient is safe for discharge and has been instructed to return immediately for worsening symptoms, change in symptoms or any other concerns.  Discussed course of treatment thoroughly with the patient, whom demonstrated understanding.  Patient in agreement and has no further questions.  I discussed this case with my attending physician Dr. Zenia Resides, who agreed with the proposed treatment course and cosigned this note including patient's presenting symptoms, physical exam, and planned diagnostics and interventions.  Attending physician stated agreement with plan or made changes to plan which were implemented.     This chart was dictated using voice recognition software.  Despite best efforts to proofread, errors can occur which can change the documentation meaning.         Final Clinical Impression(s) / ED Diagnoses Final diagnoses:  Rash in adult    Rx / DC Orders ED Discharge Orders          Ordered    nystatin-triamcinolone (MYCOLOG II) cream        08/29/21 1258              Prince Rome, PA-C 92/44/62 1346    Lacretia Leigh, MD 09/04/21 1605

## 2021-08-29 NOTE — Telephone Encounter (Signed)
Copied from [place] [phone]. Topic: General - Other >> Aug 29, 2021 12:08 PM Pawlus, Brayton Layman A wrote: Reason for CRM: Pt stated the wrong date was input on her TB test, pt requests the correct date is listed and uploaded to her MyChart account.

## 2021-08-29 NOTE — ED Notes (Signed)
Fine rash on several areas on body, itches at times.  NAD

## 2021-08-29 NOTE — ED Triage Notes (Signed)
Pt reports bumps/rash to arms, legs, and abdomin for about 1 month.

## 2021-08-29 NOTE — Discharge Instructions (Addendum)
Follow-up with your PCP as planned within the next few days.  Continue taking the nystatin cream as instructed.  A refill for this prescription has been sent to your pharmacy.  Return to the ED for new or worsening symptoms as discussed.

## 2021-08-29 NOTE — ED Notes (Signed)
Pt verbalized understanding of dc instructions, keypad unable to sign due to registration in pt's chart

## 2021-08-29 NOTE — Telephone Encounter (Unsigned)
Copied from [facility] [id number]. Topic: General - Other >> Aug 29, 2021 12:08 PM Pawlus, Brayton Layman A wrote: Reason for CRM: Pt stated the wrong date was input on her TB test, pt requests the correct date is listed and uploaded to her MyChart account.

## 2021-08-30 NOTE — Telephone Encounter (Signed)
Noted paperwork was corrected

## 2021-08-30 NOTE — Telephone Encounter (Signed)
Noted paperwork was already corrected

## 2021-09-11 ENCOUNTER — Other Ambulatory Visit: Payer: Self-pay | Admitting: Physician Assistant

## 2021-09-11 ENCOUNTER — Other Ambulatory Visit: Payer: Self-pay | Admitting: Nurse Practitioner

## 2021-09-11 DIAGNOSIS — I10 Essential (primary) hypertension: Secondary | ICD-10-CM

## 2021-09-11 DIAGNOSIS — E1165 Type 2 diabetes mellitus with hyperglycemia: Secondary | ICD-10-CM

## 2021-09-13 ENCOUNTER — Encounter (HOSPITAL_COMMUNITY): Payer: Self-pay | Admitting: Emergency Medicine

## 2021-09-13 ENCOUNTER — Emergency Department (HOSPITAL_COMMUNITY)
Admission: EM | Admit: 2021-09-13 | Discharge: 2021-09-13 | Disposition: A | Discharge status: Home / Self Care | Payer: 59 | Attending: Emergency Medicine | Admitting: Emergency Medicine

## 2021-09-13 DIAGNOSIS — B353 Tinea pedis: Secondary | ICD-10-CM | POA: Diagnosis not present

## 2021-09-13 DIAGNOSIS — L299 Pruritus, unspecified: Secondary | ICD-10-CM | POA: Diagnosis present

## 2021-09-13 MED ORDER — CLOTRIMAZOLE 1 % EX CREA: TOPICAL_CREAM | CUTANEOUS | 0 refills | Status: DC

## 2021-09-13 NOTE — ED Provider Notes (Signed)
[facility] EMERGENCY DEPARTMENT Provider Note   CSN: [id number] Arrival date & time: 09/13/21  1045     History  Chief Complaint  Patient presents with   Feet Itch    Amanda Tran is a 56 y.o. female.  56 year old female with prior medical history as detailed below presents for evaluation of itchy feet.  Patient reports that approximate 3 weeks ago she started getting itchy rash to the soles of both feet.  She is barefoot frequently.  She goes to the pool frequently.  She is also barefoot and clients houses.  She denies prior history of athlete's foot.  He denies other complaint.  The history is provided by the patient and medical records.  Illness Location:  Itchy bilateral feet Severity:  Mild Onset quality:  Gradual Duration:  3 weeks Timing:  Constant Progression:  Unchanged      Home Medications Prior to Admission medications   Medication Sig Start Date End Date Taking? Authorizing Provider  clotrimazole (LOTRIMIN) 1 % cream Apply to affected area 2 times daily 09/13/21  Yes Matalyn Nawaz, Wallis Bamberg, MD  atorvastatin (LIPITOR) 20 MG tablet Take 1 tablet (20 mg total) by mouth daily. NEEDS PASS 12/04/20 03/04/21  [name], NP  Blood Glucose Monitoring Suppl (ONETOUCH VERIO) w/Device KIT Use to check blood sugar once daily. E11.65 05/26/20   Charlott Rakes, MD  glimepiride (AMARYL) 4 MG tablet Take 2 tablets (8 mg total) by mouth daily with breakfast. 07/31/21 10/29/21  [name], NP  glucose blood (ONETOUCH VERIO) test strip Use to check blood sugar once daily. E11.65 05/26/20   Charlott Rakes, MD  lisinopril (ZESTRIL) 10 MG tablet Take 1 tablet by mouth once daily 09/11/21   Charlott Rakes, MD  metFORMIN (GLUCOPHAGE) 500 MG tablet Take 1 tablet (500 mg total) by mouth daily with breakfast. 07/31/21 10/29/21  [name], NP  metroNIDAZOLE (METROGEL VAGINAL) 0.75 % vaginal gel Place 1 Applicatorful vaginally 2 (two) times daily. 07/31/21    [name], NP  nystatin-triamcinolone [facility] II) cream Apply to affected area daily 07/28/36   Prince Rome, PA-C  OneTouch Delica Lancets 75I MISC Use to check blood sugar once daily. E11.65 05/26/20   Charlott Rakes, MD  triamcinolone cream (KENALOG) 0.1 % APPLY TOPICALLY DAILY UNTIL RESOLVED 09/12/21   [name], NP      Allergies    Patient has no known allergies.    Review of Systems   Review of Systems  All other systems reviewed and are negative.   Physical Exam Updated Vital Signs BP (!) 154/103 (BP Location: Right Arm)   Pulse 83   Temp 97.8 F (36.6 C) (Oral)   Resp 16   SpO2 97%  Physical Exam Vitals and nursing note reviewed.  Constitutional:      General: She is not in acute distress.    Appearance: Normal appearance. She is well-developed.  HENT:     Head: Normocephalic and atraumatic.  Eyes:     Conjunctiva/sclera: Conjunctivae normal.     Pupils: Pupils are equal, round, and reactive to light.  Cardiovascular:     Rate and Rhythm: Normal rate.  Pulmonary:     Effort: Pulmonary effort is normal. No respiratory distress.  Abdominal:     General: There is no distension.     Palpations: Abdomen is soft.     Tenderness: There is no abdominal tenderness.  Musculoskeletal:        General: No  deformity. Normal range of motion.     Cervical back: Normal range of motion and neck supple.  Skin:    General: Skin is warm and dry.     Comments: Mild erythema and rash to the soles of bilateral feet consistent with tinea pedis  Neurological:     General: No focal deficit present.     Mental Status: She is alert and oriented to person, place, and time.     ED Results / Procedures / Treatments   Labs (all labs ordered are listed, but only abnormal results are displayed) Labs Reviewed - No data to display  EKG None  Radiology No results found.  Procedures Procedures    Medications Ordered in ED Medications - No data to display  ED  Course/ Medical Decision Making/ A&P                           Medical Decision Making   Medical Screen Complete  This patient presented to the ED with complaint of bilateral itchy feet.  This complaint involves an extensive number of treatment options. The initial differential diagnosis includes, but is not limited to, tinea pedis  This presentation is: Acute, Self-Limited, Previously Undiagnosed, and Uncertain Prognosis  Patient is presenting with complaint of itchy feet.  Patient appears to have a tinea pedis infection of both feet.  She is without other complaint or findings on exam.  She does understand course of outpatient treatment.  Importance of close follow-up is stressed.  Strict return precautions given and understood.  Additional history obtained:  External records from outside sources obtained and reviewed including prior ED visits and prior Inpatient records.  Problem List / ED Course:  Tinea pedis   Reevaluation:  After the interventions noted above, I reevaluated the patient and found that they have: improved  Disposition:  After consideration of the diagnostic results and the patients response to treatment, I feel that the patent would benefit from close outpatient follow-up.          Final Clinical Impression(s) / ED Diagnoses Final diagnoses:  Tinea pedis of both feet    Rx / DC Orders ED Discharge Orders          Ordered    clotrimazole (LOTRIMIN) 1 % cream        09/13/21 1214              Valarie Merino, MD 09/13/21 1217

## 2021-09-13 NOTE — Discharge Instructions (Signed)
Return for any problem.  Keep feet dry.  Apply antifungal cream to both feet at least twice daily for the next 4 weeks.

## 2021-09-13 NOTE — ED Triage Notes (Signed)
Patient complains of itching to the bottom of bilateral feet that started three weeks ago. 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 Patient is alert, oriented, ambulatory, and in no apparent distress a this time.

## 2021-09-14 ENCOUNTER — Ambulatory Visit: Payer: 59 | Admitting: Nurse Practitioner

## 2021-09-15 ENCOUNTER — Ambulatory Visit (INDEPENDENT_AMBULATORY_CARE_PROVIDER_SITE_OTHER): Payer: Self-pay | Admitting: Podiatry

## 2021-09-15 DIAGNOSIS — Z91199 Patient's noncompliance with other medical treatment and regimen due to unspecified reason: Secondary | ICD-10-CM

## 2021-09-19 ENCOUNTER — Other Ambulatory Visit: Payer: Self-pay | Admitting: Pharmacist

## 2021-09-19 DIAGNOSIS — E1165 Type 2 diabetes mellitus with hyperglycemia: Secondary | ICD-10-CM

## 2021-09-19 MED ORDER — GLIMEPIRIDE 4 MG PO TABS: 8.0000 mg | ORAL_TABLET | Freq: Every day | ORAL | 0 refills | Status: DC

## 2021-10-01 ENCOUNTER — Encounter: Payer: Self-pay | Admitting: Nurse Practitioner

## 2021-10-01 ENCOUNTER — Other Ambulatory Visit: Payer: Self-pay | Admitting: Nurse Practitioner

## 2021-10-02 NOTE — Telephone Encounter (Signed)
Requested medication (s) are due for refill today: yes  Requested medication (s) are on the active medication list: yes  Last refill:  09/12/21  Future visit scheduled:yes  Notes to clinic:  Unable to refill per protocol, no protocol attached, routing for approval.     Requested Prescriptions  Pending Prescriptions Disp Refills   triamcinolone cream (KENALOG) 0.1 % [Pharmacy Med Name: Triamcinolone Acetonide 0.1 % External Cream] 45 g 0    Sig: APPLY TOPICALLY DAILY UNTIL RESOLVED     There is no refill protocol information for this order

## 2021-10-20 ENCOUNTER — Other Ambulatory Visit: Payer: Self-pay | Admitting: Nurse Practitioner

## 2021-10-20 DIAGNOSIS — E1165 Type 2 diabetes mellitus with hyperglycemia: Secondary | ICD-10-CM

## 2021-10-22 NOTE — Telephone Encounter (Signed)
Requested Prescriptions  Pending Prescriptions Disp Refills  . metFORMIN (GLUCOPHAGE) 500 MG tablet [Pharmacy Med Name: metFORMIN HCl 500 MG Oral Tablet] 90 tablet 0    Sig: Take 1 tablet by mouth once daily with breakfast     Endocrinology:  Diabetes - Biguanides Failed - 10/20/2021 10:31 AM      Failed - B12 Level in normal range and within 720 days    Vitamin B-12  Date Value Ref Range Status  03/18/2012 437 211 - 911 pg/mL Final         Failed - CBC within normal limits and completed in the last 12 months    WBC  Date Value Ref Range Status  04/27/2021 5.1 3.4 - 10.8 x10E3/uL Final  06/02/2018 4.8 4.0 - 10.5 K/uL Final   RBC  Date Value Ref Range Status  04/27/2021 5.51 (H) 3.77 - 5.28 x10E6/uL Final  06/02/2018 5.06 3.87 - 5.11 MIL/uL Final   Hemoglobin  Date Value Ref Range Status  04/27/2021 14.4 11.1 - 15.9 g/dL Final   Hematocrit  Date Value Ref Range Status  04/27/2021 44.0 34.0 - 46.6 % Final   MCHC  Date Value Ref Range Status  04/27/2021 32.7 31.5 - 35.7 g/dL Final  06/02/2018 28.4 (L) 30.0 - 36.0 g/dL Final   [facility]  Date Value Ref Range Status  04/27/2021 26.1 (L) 26.6 - 33.0 pg Final  06/02/2018 20.9 (L) 26.0 - 34.0 pg Final   MCV  Date Value Ref Range Status  04/27/2021 80 79 - 97 fL Final   No results found for: "PLTCOUNTKUC", "LABPLAT", "POCPLA" RDW  Date Value Ref Range Status  04/27/2021 14.9 11.7 - 15.4 % Final         Passed - Cr in normal range and within 360 days    Creat  Date Value Ref Range Status  12/21/2014 0.78 0.50 - 1.10 mg/dL Final   Creatinine, Ser  Date Value Ref Range Status  04/27/2021 0.96 0.57 - 1.00 mg/dL Final         Passed - HBA1C is between 0 and 7.9 and within 180 days    Hemoglobin A1C  Date Value Ref Range Status  04/27/2021 6.8 (A) 4.0 - 5.6 % Final   HbA1c, POC (controlled diabetic range)  Date Value Ref Range Status  09/01/2020 7.2 (A) 0.0 - 7.0 % Final         Passed - eGFR in normal range and  within 360 days    GFR, Est African American  Date Value Ref Range Status  12/21/2014 >89 >=60 mL/min Final   GFR calc Af Amer  Date Value Ref Range Status  02/23/2020 84 >59 mL/min/1.73 Final    Comment:    **In accordance with recommendations from the NKF-ASN Task force,**   [facility] is in the process of updating its eGFR calculation to the   2021 CKD-EPI creatinine equation that estimates kidney function   without a race variable.    GFR, Est Non African American  Date Value Ref Range Status  12/21/2014 >89 >=60 mL/min Final    Comment:      The estimated GFR is a calculation valid for adults (>=40 years old) that uses the CKD-EPI algorithm to adjust for age and sex. It is   not to be used for children, pregnant women, hospitalized patients,    patients on dialysis, or with rapidly changing kidney function. According to the NKDEP, eGFR >89 is normal, 60-89 shows mild impairment, 30-59 shows moderate  impairment, 15-29 shows severe impairment and <15 is ESRD.      GFR calc non Af Amer  Date Value Ref Range Status  02/23/2020 73 >59 mL/min/1.73 Final   eGFR  Date Value Ref Range Status  04/27/2021 71 >59 mL/min/1.73 Final         Passed - Valid encounter within last 6 months    Recent Outpatient Visits          2 months ago Type 2 diabetes mellitus with hyperglycemia, without long-term current use of insulin [facility])   [place], [place] W, NP   5 months ago Type 2 diabetes mellitus with hyperglycemia, without long-term current use of insulin [facility])   [place], [place], NP   8 months ago Type 2 diabetes mellitus with hyperglycemia, without long-term current use of insulin [facility])   [place], Vernia Buff, NP   10 months ago Essential hypertension   [place], [place] W, NP   1 year ago Type 2 diabetes mellitus with  hyperglycemia, without long-term current use of insulin [facility])   [place], Vernia Buff, NP      Future Appointments            In 2 months [name], NP [place]

## 2021-10-28 ENCOUNTER — Other Ambulatory Visit: Payer: Self-pay | Admitting: Family Medicine

## 2021-10-31 ENCOUNTER — Ambulatory Visit: Payer: 59 | Admitting: Nurse Practitioner

## 2021-11-01 IMAGING — MG MM DIGITAL SCREENING BILAT W/ TOMO AND CAD
8 series · 8 of 24 positions shown · non-contrast
Comparison: Previous exam(s).

CLINICAL DATA: Screening.

EXAM:
DIGITAL SCREENING BILATERAL MAMMOGRAM WITH TOMOSYNTHESIS AND CAD
TECHNIQUE: Bilateral screening digital craniocaudal and mediolateral oblique
mammograms were obtained. Bilateral screening digital breast
tomosynthesis was performed. The images were evaluated with
computer-aided detection.

[L CC synth-2D]
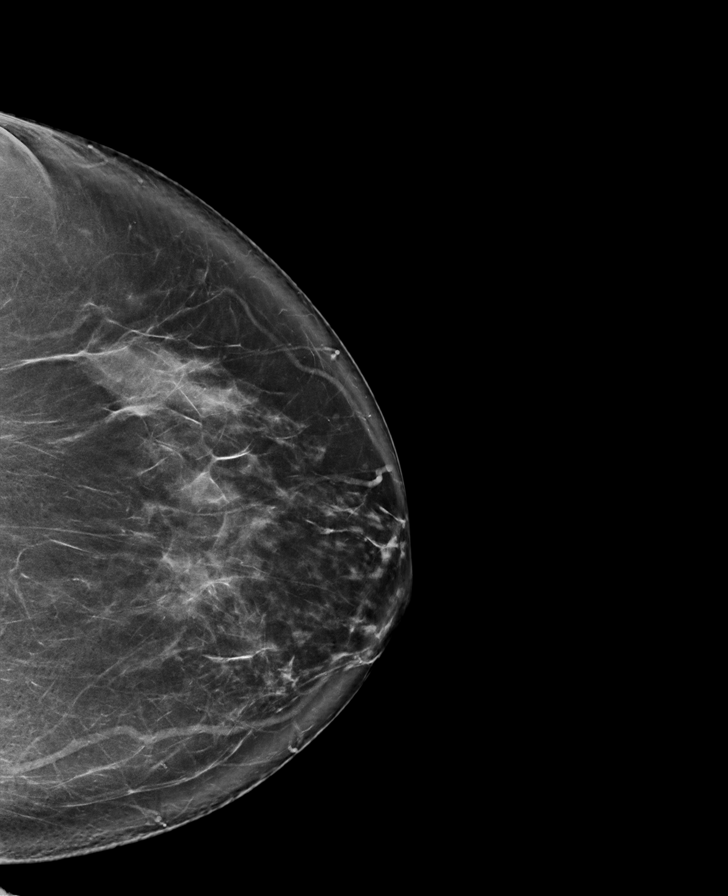

[L MLO synth-2D]
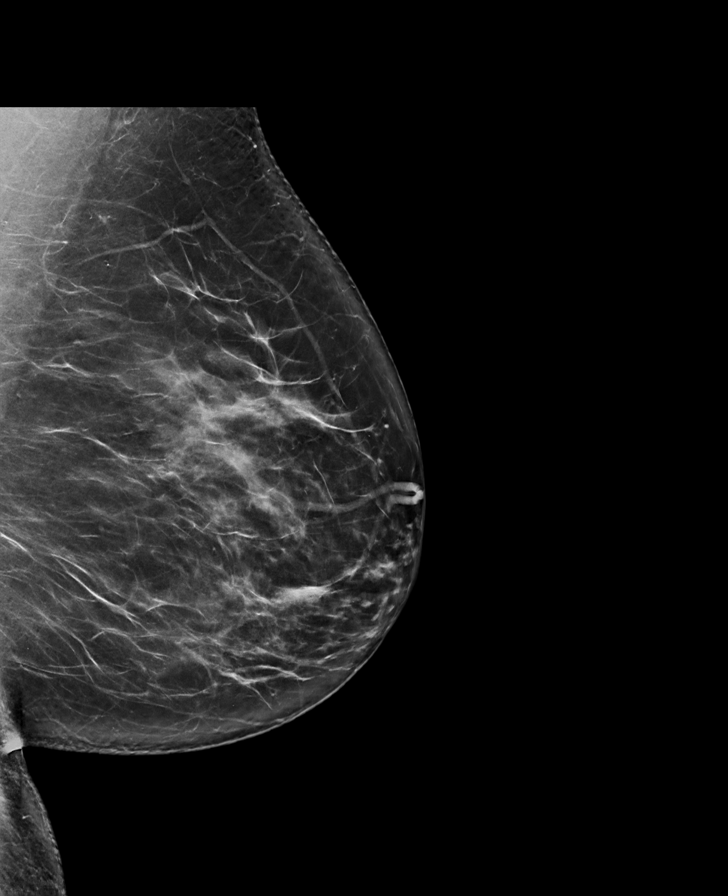

[R MLO synth-2D]
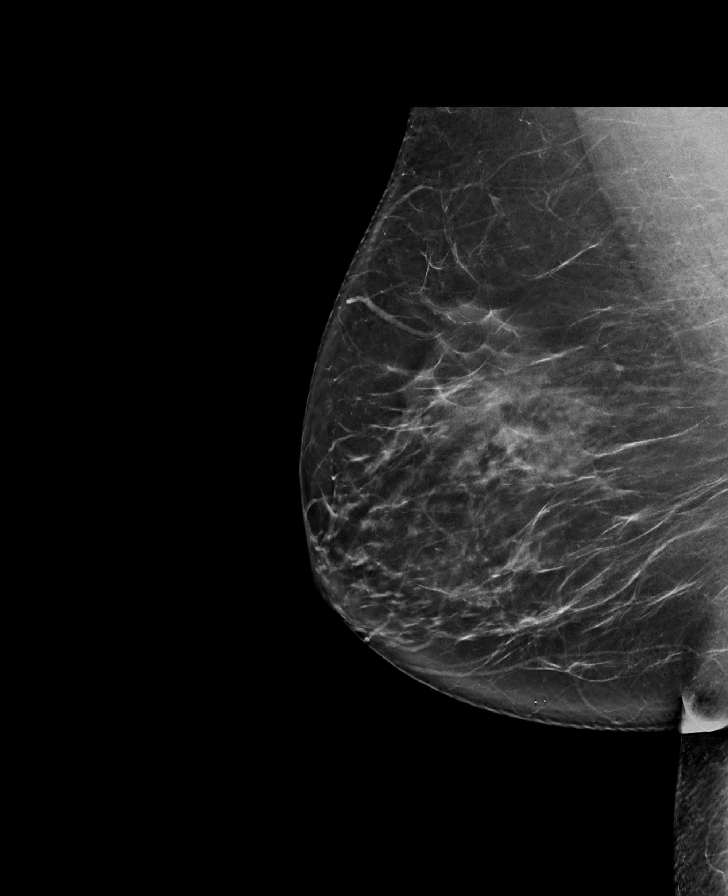

[R CC synth-2D]
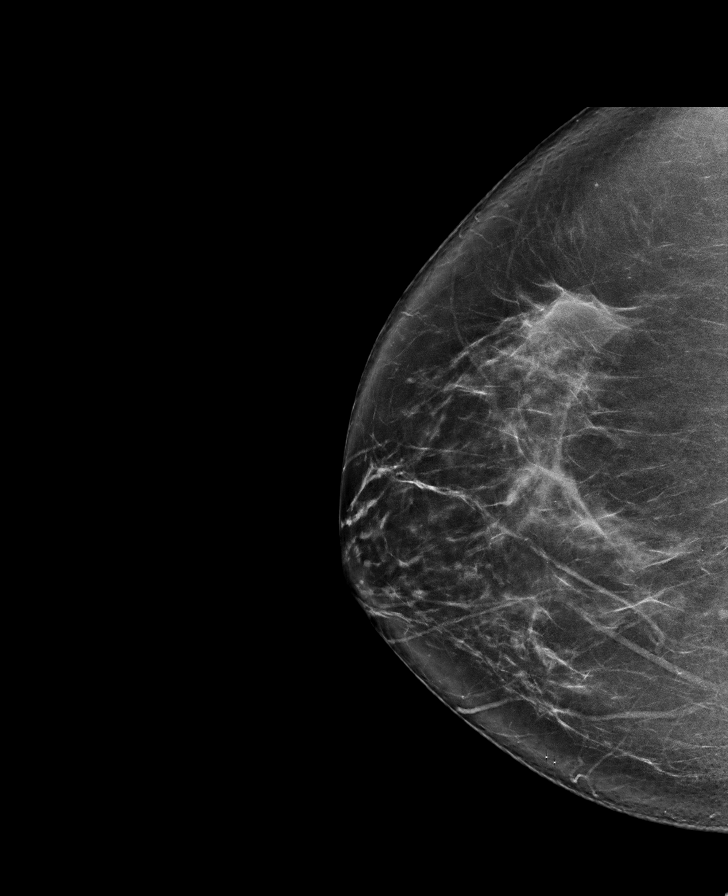

[L MLO tomo · tomo slice 45/88.0]
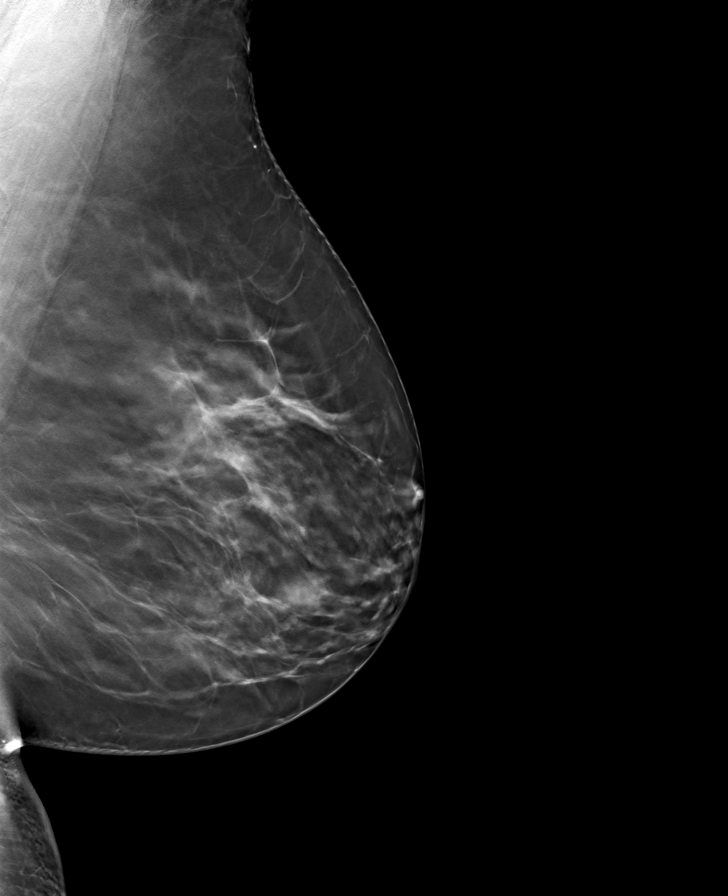

[R MLO tomo · tomo slice 46/91.0]
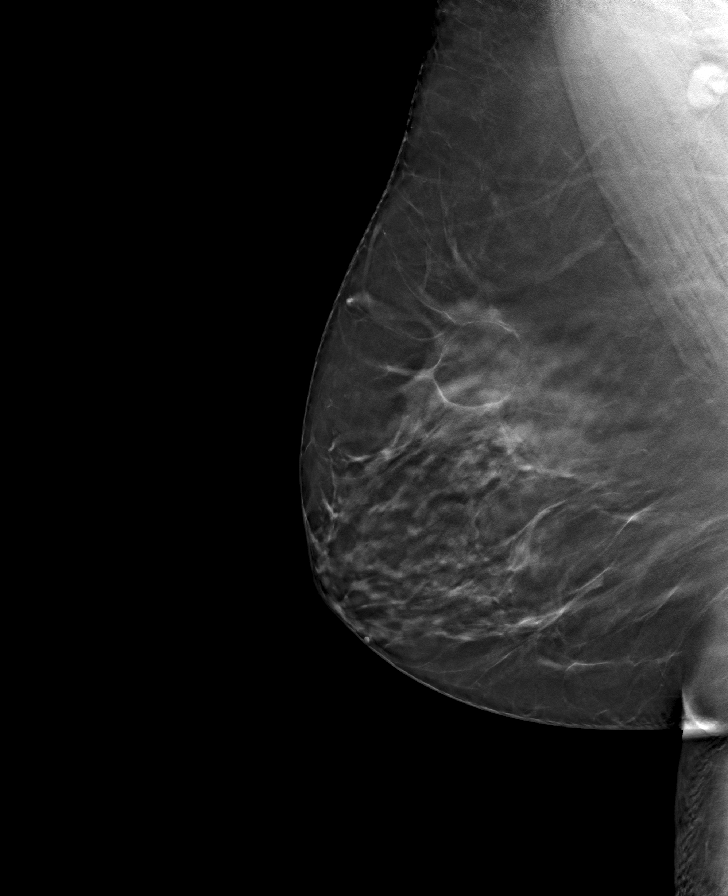

[L CC tomo · tomo slice 45/89.0]
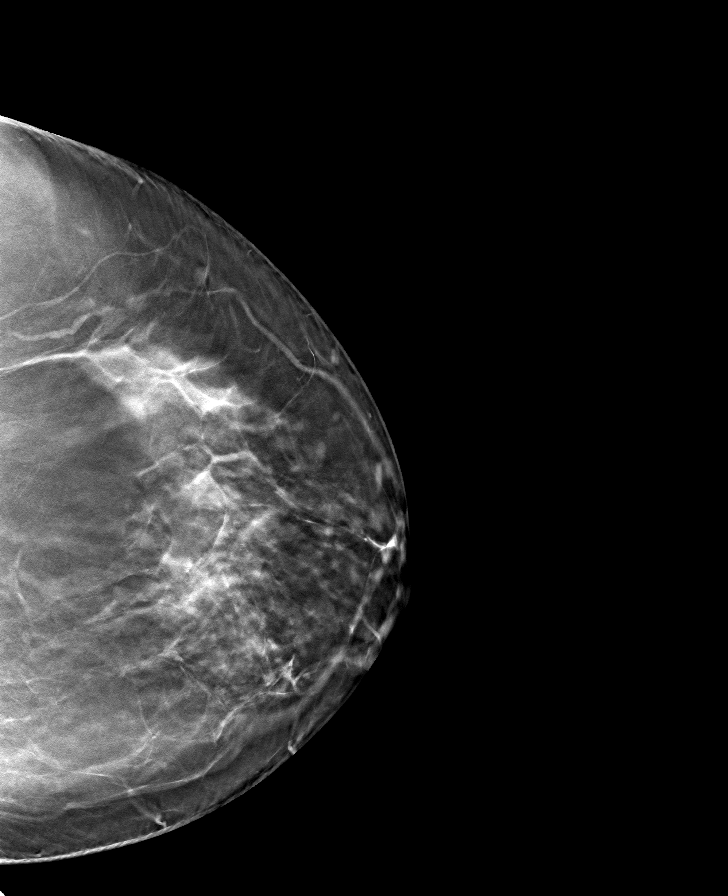

[R CC tomo · tomo slice 44/87.0]
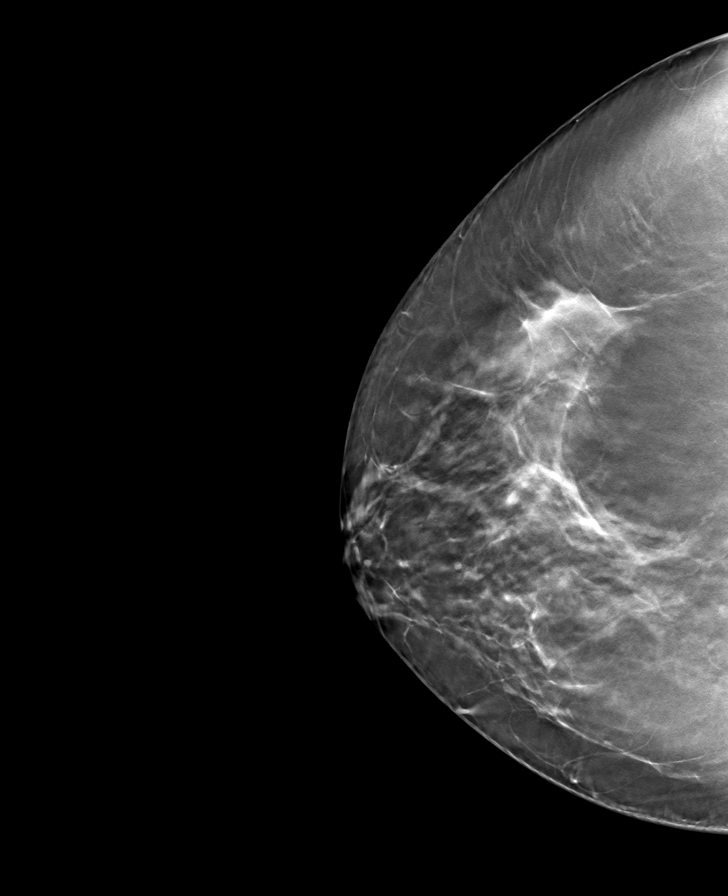

[8 of 24 positions shown; findings below may reference images not displayed]

ACR Breast Density Category c: The breast tissue is heterogeneously
dense, which may obscure small masses.
FINDINGS: There are no findings suspicious for malignancy.
IMPRESSION: No mammographic evidence of malignancy. A result letter of this
screening mammogram will be mailed directly to the patient.

RECOMMENDATION:
Screening mammogram in one year. (Code:Q3-W-BC3)

BI-RADS CATEGORY  1: Negative.

## 2021-11-05 ENCOUNTER — Other Ambulatory Visit: Payer: Self-pay | Admitting: Nurse Practitioner

## 2021-11-05 DIAGNOSIS — E611 Iron deficiency: Secondary | ICD-10-CM

## 2021-11-10 ENCOUNTER — Encounter: Payer: Self-pay | Admitting: Nurse Practitioner

## 2021-11-13 ENCOUNTER — Other Ambulatory Visit: Payer: Self-pay | Admitting: Nurse Practitioner

## 2021-11-13 DIAGNOSIS — F5105 Insomnia due to other mental disorder: Secondary | ICD-10-CM

## 2021-11-13 MED ORDER — TRAZODONE HCL 50 MG PO TABS: 25.0000 mg | ORAL_TABLET | Freq: Every evening | ORAL | 3 refills | Status: DC | PRN

## 2021-11-13 MED ORDER — ESCITALOPRAM OXALATE 10 MG PO TABS: ORAL_TABLET | ORAL | 1 refills | Status: DC

## 2021-11-15 ENCOUNTER — Emergency Department (HOSPITAL_BASED_OUTPATIENT_CLINIC_OR_DEPARTMENT_OTHER)
Admission: EM | Admit: 2021-11-15 | Discharge: 2021-11-15 | Disposition: A | Discharge status: Home / Self Care | Payer: 59 | Attending: Emergency Medicine | Admitting: Emergency Medicine

## 2021-11-15 ENCOUNTER — Other Ambulatory Visit: Payer: Self-pay

## 2021-11-15 ENCOUNTER — Emergency Department (HOSPITAL_BASED_OUTPATIENT_CLINIC_OR_DEPARTMENT_OTHER): Payer: 59

## 2021-11-15 ENCOUNTER — Encounter (HOSPITAL_BASED_OUTPATIENT_CLINIC_OR_DEPARTMENT_OTHER): Payer: Self-pay | Admitting: Emergency Medicine

## 2021-11-15 DIAGNOSIS — Z20822 Contact with and (suspected) exposure to covid-19: Secondary | ICD-10-CM | POA: Diagnosis not present

## 2021-11-15 DIAGNOSIS — E119 Type 2 diabetes mellitus without complications: Secondary | ICD-10-CM | POA: Diagnosis not present

## 2021-11-15 DIAGNOSIS — Z79899 Other long term (current) drug therapy: Secondary | ICD-10-CM | POA: Insufficient documentation

## 2021-11-15 DIAGNOSIS — I10 Essential (primary) hypertension: Secondary | ICD-10-CM | POA: Diagnosis not present

## 2021-11-15 DIAGNOSIS — R0789 Other chest pain: Secondary | ICD-10-CM | POA: Insufficient documentation

## 2021-11-15 DIAGNOSIS — R1030 Lower abdominal pain, unspecified: Secondary | ICD-10-CM | POA: Diagnosis not present

## 2021-11-15 DIAGNOSIS — R5383 Other fatigue: Secondary | ICD-10-CM | POA: Diagnosis present

## 2021-11-15 DIAGNOSIS — Z7984 Long term (current) use of oral hypoglycemic drugs: Secondary | ICD-10-CM | POA: Insufficient documentation

## 2021-11-15 DIAGNOSIS — R350 Frequency of micturition: Secondary | ICD-10-CM | POA: Insufficient documentation

## 2021-11-15 LAB — HEPATIC FUNCTION PANEL
ALT: 34 U/L (ref 0–44)
AST: 34 U/L (ref 15–41)
Albumin: 3.9 g/dL (ref 3.5–5.0)
Alkaline Phosphatase: 121 U/L (ref 38–126)
Bilirubin, Direct: <0.1 mg/dL (ref 0.0–0.2)
Total Bilirubin: 0.6 mg/dL (ref 0.3–1.2)
Total Protein: 7.5 g/dL (ref 6.5–8.1)

## 2021-11-15 LAB — CBC
HCT: 39.4 % (ref 36.0–46.0)
Hemoglobin: 13.2 g/dL (ref 12.0–15.0)
MCH: 27.3 pg (ref 26.0–34.0)
MCHC: 33.5 g/dL (ref 30.0–36.0)
MCV: 81.6 fL (ref 80.0–100.0)
Platelets: 251 10*3/uL (ref 150–400)
RBC: 4.83 MIL/uL (ref 3.87–5.11)
RDW: 14.7 % (ref 11.5–15.5)
WBC: 5.2 10*3/uL (ref 4.0–10.5)
nRBC: 0 % (ref 0.0–0.2)

## 2021-11-15 LAB — BASIC METABOLIC PANEL
Anion gap: 8 (ref 5–15)
BUN: 25 mg/dL — ABNORMAL HIGH (ref 6–20)
CO2: 23 mmol/L (ref 22–32)
Calcium: 8.8 mg/dL — ABNORMAL LOW (ref 8.9–10.3)
Chloride: 101 mmol/L (ref 98–111)
Creatinine, Ser: 1.06 mg/dL — ABNORMAL HIGH (ref 0.44–1.00)
GFR, Estimated: >60 mL/min (ref >60)
Glucose, Bld: 322 mg/dL — ABNORMAL HIGH (ref 70–99)
Potassium: 3.9 mmol/L (ref 3.5–5.1)
Sodium: 132 mmol/L — ABNORMAL LOW (ref 135–145)

## 2021-11-15 LAB — TROPONIN I (HIGH SENSITIVITY): Troponin I (High Sensitivity): 2 ng/L (ref <18)

## 2021-11-15 LAB — LIPASE, BLOOD: Lipase: 31 U/L (ref 11–51)

## 2021-11-15 LAB — URINALYSIS, ROUTINE W REFLEX MICROSCOPIC
Bilirubin Urine: NEGATIVE
Glucose, UA: >=500 mg/dL — AB
Hgb urine dipstick: NEGATIVE
Ketones, ur: NEGATIVE mg/dL
Leukocytes,Ua: NEGATIVE
Nitrite: NEGATIVE
Protein, ur: NEGATIVE mg/dL
Specific Gravity, Urine: 1.02 (ref 1.005–1.030)
pH: 5.5 (ref 5.0–8.0)

## 2021-11-15 LAB — SARS CORONAVIRUS 2 BY RT PCR: SARS Coronavirus 2 by RT PCR: NEGATIVE

## 2021-11-15 LAB — URINALYSIS, MICROSCOPIC (REFLEX)
RBC / HPF: NONE SEEN RBC/hpf (ref 0–5)
WBC, UA: NONE SEEN WBC/hpf (ref 0–5)

## 2021-11-15 MED ORDER — LACTATED RINGERS IV BOLUS
1000.0000 mL | Freq: Once | INTRAVENOUS | Status: AC
  Administered 2021-11-15: 1000 mL via INTRAVENOUS

## 2021-11-15 NOTE — ED Provider Notes (Signed)
[facility] EMERGENCY DEPARTMENT Provider Note   CSN: [id number] Arrival date & time: 11/15/21  1817     History  Chief Complaint  Patient presents with   Fatigue    Amanda Tran is a 56 y.o. female.  56 year old female presents today for evaluation of fatigue, lower abdominal pain, urinary frequency, as well as chest pain.  Symptoms ongoing for 2 days, with the exception of urinary frequency which has been ongoing for 2 weeks.  Denies shortness of breath, lightheadedness, or palpitations.  Denies nausea, vomiting, fever, or chills.  She is without vaginal discharge, dysuria, or vaginal bleeding.  Denies flank pain.  The history is provided by the patient. No language interpreter was used.       Home Medications Prior to Admission medications   Medication Sig Start Date End Date Taking? Authorizing Provider  atorvastatin (LIPITOR) 20 MG tablet Take 1 tablet (20 mg total) by mouth daily. NEEDS PASS 12/04/20 03/04/21  [name], NP  Blood Glucose Monitoring Suppl (ONETOUCH VERIO) w/Device KIT Use to check blood sugar once daily. E11.65 05/26/20   Charlott Rakes, MD  clotrimazole (LOTRIMIN) 1 % cream Apply to affected area 2 times daily 09/13/21   Valarie Merino, MD  escitalopram (LEXAPRO) 10 MG tablet Take 1/2 tablet by mouth daily for 2 weeks and then increase to 1 whole tablet by mouth daily if needed 11/13/21   [name], NP  glimepiride (AMARYL) 4 MG tablet Take 2 tablets (8 mg total) by mouth daily with breakfast. 09/19/21   Charlott Rakes, MD  glucose blood (ONETOUCH VERIO) test strip Use to check blood sugar once daily. E11.65 05/26/20   Charlott Rakes, MD  lisinopril (ZESTRIL) 10 MG tablet Take 1 tablet by mouth once daily 09/11/21   Charlott Rakes, MD  metFORMIN (GLUCOPHAGE) 500 MG tablet Take 1 tablet by mouth once daily with breakfast 10/22/21   [name], NP  metroNIDAZOLE (METROGEL VAGINAL) 0.75 % vaginal gel Place 1 Applicatorful  vaginally 2 (two) times daily. 07/31/21   [name], NP  nystatin-triamcinolone [facility] II) cream Apply to affected area daily 10/26/39   Prince Rome, PA-C  OneTouch Delica Lancets 96Q MISC Use to check blood sugar once daily. E11.65 05/26/20   Charlott Rakes, MD  traZODone (DESYREL) 50 MG tablet Take 0.5-1 tablets (25-50 mg total) by mouth at bedtime as needed for sleep. 11/13/21   [name], NP  triamcinolone cream (KENALOG) 0.1 % APPLY TOPICALLY DAILY UNTIL RESOLVED 10/29/21   Charlott Rakes, MD      Allergies    Patient has no known allergies.    Review of Systems   Review of Systems  Constitutional:  Negative for activity change, chills and fever.  Respiratory:  Negative for shortness of breath.   Cardiovascular:  Positive for chest pain. Negative for palpitations and leg swelling.  Gastrointestinal:  Positive for abdominal pain. Negative for abdominal distention, nausea and vomiting.  Genitourinary:  Positive for frequency. Negative for dysuria, flank pain, vaginal bleeding and vaginal discharge.  Neurological:  Negative for weakness and light-headedness.  All other systems reviewed and are negative.   Physical Exam Updated Vital Signs BP 121/79 (BP Location: Right Arm)   Pulse 87   Temp 97.6 F (36.4 C) (Oral)   Resp 16   Ht _0  (1.549 m)   Wt 95.3 kg   SpO2 100%   BMI 39.68 kg/m  Physical Exam Vitals and nursing note reviewed.  Constitutional:      General: She is not in acute distress.    Appearance: Normal appearance. She is not ill-appearing.  HENT:     Head: Normocephalic and atraumatic.     Nose: Nose normal.  Eyes:     General: No scleral icterus.    Extraocular Movements: Extraocular movements intact.     Conjunctiva/sclera: Conjunctivae normal.  Cardiovascular:     Rate and Rhythm: Normal rate and regular rhythm.     Pulses: Normal pulses.  Pulmonary:     Effort: Pulmonary effort is normal. No respiratory distress.     Breath sounds:  Normal breath sounds. No wheezing or rales.  Abdominal:     General: There is no distension.     Tenderness: There is no abdominal tenderness. There is no right CVA tenderness, left CVA tenderness or guarding.  Musculoskeletal:        General: Normal range of motion.     Cervical back: Normal range of motion.     Right lower leg: No edema.     Left lower leg: No edema.  Skin:    General: Skin is warm and dry.  Neurological:     General: No focal deficit present.     Mental Status: She is alert. Mental status is at baseline.     ED Results / Procedures / Treatments   Labs (all labs ordered are listed, but only abnormal results are displayed) Labs Reviewed  BASIC METABOLIC PANEL - Abnormal; Notable for the following components:      Result Value   Sodium 132 (*)    Glucose, Bld 322 (*)    BUN 25 (*)    Creatinine, Ser 1.06 (*)    Calcium 8.8 (*)    All other components within normal limits  SARS CORONAVIRUS 2 BY RT PCR  CBC  URINALYSIS, ROUTINE W REFLEX MICROSCOPIC  TROPONIN I (HIGH SENSITIVITY)    EKG None  Radiology No results found.  Procedures Procedures    Medications Ordered in ED Medications  lactated ringers bolus 1,000 mL (has no administration in time range)    ED Course/ Medical Decision Making/ A&P                           Medical Decision Making Amount and/or Complexity of Data Reviewed Labs: ordered. Radiology: ordered.   Medical Decision Making / ED Course   This patient presents to the ED for concern of fatigue, chest pain this involves an extensive number of treatment options, and is a complaint that carries with it a high risk of complications and morbidity.  The differential diagnosis includes ACS, PE, pneumonia, dehydration, MSK pain  MDM: 56 year old female presents today for evaluation of fatigue, abdominal pain, chest pain.  Ongoing for 2 days.  She denies shortness of breath, lightheadedness, palpitations.  She is overall low  risk for PE on Wells criteria.  Not tachycardic, tachypneic, or hypoxic.  Denies shortness of breath.  Doubt PE.  EKG without acute ischemic changes.  Troponin initially 2.  Given symptoms ongoing for 2 days doubt ACS.  UA without evidence of UTI.  COVID-negative.  Lipase within normal limits.  CBC without leukocytosis or anemia.  BMP shows slight elevation in BUN and creatinine.  Consistent with dehydration.  Glucose elevated at 322.  She is noncompliant with metformin and glimepiride.  Discussed compliance and follow-up with PCP.  On reevaluation patient reports significant improvement.  She has received  a liter of fluid bolus.  LFTs are normal.  Patient is appropriate for discharge.  Discharged in stable condition.  Return precautions discussed.   Lab Tests: -I ordered, reviewed, and interpreted labs.   The pertinent results include:   Labs Reviewed  URINALYSIS, ROUTINE W REFLEX MICROSCOPIC - Abnormal; Notable for the following components:      Result Value   Glucose, UA >=500 (*)    All other components within normal limits  BASIC METABOLIC PANEL - Abnormal; Notable for the following components:   Sodium 132 (*)    Glucose, Bld 322 (*)    BUN 25 (*)    Creatinine, Ser 1.06 (*)    Calcium 8.8 (*)    All other components within normal limits  URINALYSIS, MICROSCOPIC (REFLEX) - Abnormal; Notable for the following components:   Bacteria, UA RARE (*)    All other components within normal limits  SARS CORONAVIRUS 2 BY RT PCR  CBC  LIPASE, BLOOD  HEPATIC FUNCTION PANEL  TROPONIN I (HIGH SENSITIVITY)      EKG  EKG Interpretation  Date/Time:    Ventricular Rate:    PR Interval:    QRS Duration:   QT Interval:    QTC Calculation:   R Axis:     Text Interpretation:           Imaging Studies ordered: I ordered imaging studies including chest x-ray I independently visualized and interpreted imaging. I agree with the radiologist interpretation   Medicines ordered and  prescription drug management: Meds ordered this encounter  Medications   lactated ringers bolus 1,000 mL    -I have reviewed the patients home medicines and have made adjustments as needed  Cardiac Monitoring: The patient was maintained on a cardiac monitor.  I personally viewed and interpreted the cardiac monitored which showed an underlying rhythm of: Normal sinus rhythm   Co morbidities that complicate the patient evaluation  Past Medical History:  Diagnosis Date   Anemia    Anxiety    Diabetes ([place])    DVT (deep venous thrombosis) ([place]) 03/11/2012   Left popliteal    Hyperlipidemia    Hypertension    Vitamin D deficiency       Dispostion: Patient is appropriate for discharge.  Discharged in stable condition.  Return precautions discussed.   Final Clinical Impression(s) / ED Diagnoses Final diagnoses:  Atypical chest pain  Other fatigue    Rx / DC Orders ED Discharge Orders     None         Evlyn Courier, PA-C 11/16/21 0010    Drenda Freeze, MD 11/22/21 [phone]

## 2021-11-15 NOTE — Discharge Instructions (Signed)
Your work-up today was overall reassuring.  No concerning cause of your abdominal pain or chest pain.  Heart enzyme was normal.  Your blood work did show some signs of dehydration.  You got IV fluids in the emergency room with improvement in your symptoms.  Please start taking metformin and glimepiride as you are directed.  Your blood sugar was elevated at 322 today.  Please schedule a follow-up appointment to be seen by your primary care provider.  For any concerning or new symptoms please return to the emergency room for evaluation.

## 2021-11-15 NOTE — ED Triage Notes (Addendum)
Intermittent central chest pain x 4 days. Also reports new dry cough and feeling weak/lightheaded with exertion. Concerned for UTI due to odorous urine x 2 weeks.

## 2021-11-18 ENCOUNTER — Encounter: Payer: Self-pay | Admitting: Nurse Practitioner

## 2021-11-19 ENCOUNTER — Other Ambulatory Visit: Payer: Self-pay | Admitting: Nurse Practitioner

## 2021-11-19 DIAGNOSIS — E1165 Type 2 diabetes mellitus with hyperglycemia: Secondary | ICD-10-CM

## 2021-11-19 MED ORDER — ONETOUCH DELICA LANCETS 33G MISC: 2 refills | Status: DC

## 2021-11-19 MED ORDER — ONETOUCH VERIO VI STRP: ORAL_STRIP | 2 refills | Status: DC

## 2021-12-24 ENCOUNTER — Encounter: Payer: Self-pay | Admitting: Nurse Practitioner

## 2021-12-24 ENCOUNTER — Other Ambulatory Visit: Payer: Self-pay

## 2021-12-24 ENCOUNTER — Ambulatory Visit: Payer: 59 | Attending: Nurse Practitioner | Admitting: Nurse Practitioner

## 2021-12-24 VITALS — BP 137/94 | HR 95 | Temp 98.0°F | Ht 61.0 in | Wt 210.2 lb

## 2021-12-24 DIAGNOSIS — Z1211 Encounter for screening for malignant neoplasm of colon: Secondary | ICD-10-CM

## 2021-12-24 DIAGNOSIS — E1165 Type 2 diabetes mellitus with hyperglycemia: Secondary | ICD-10-CM

## 2021-12-24 DIAGNOSIS — Z1231 Encounter for screening mammogram for malignant neoplasm of breast: Secondary | ICD-10-CM

## 2021-12-24 DIAGNOSIS — Z23 Encounter for immunization: Secondary | ICD-10-CM

## 2021-12-24 DIAGNOSIS — E782 Mixed hyperlipidemia: Secondary | ICD-10-CM

## 2021-12-24 DIAGNOSIS — I10 Essential (primary) hypertension: Secondary | ICD-10-CM

## 2021-12-24 LAB — POCT GLYCOSYLATED HEMOGLOBIN (HGB A1C): Hemoglobin A1C: 10.2 % — AB (ref 4.0–5.6)

## 2021-12-24 LAB — GLUCOSE, POCT (MANUAL RESULT ENTRY): POC Glucose: 179 mg/dl — AB (ref 70–99)

## 2021-12-24 MED ORDER — METFORMIN HCL 500 MG PO TABS
500.0000 mg | ORAL_TABLET | Freq: Every day | ORAL | 1 refills | Status: DC
  Filled 2021-12-27 (×2): qty 90, 90d supply, fill #0
  Filled 2022-03-23: qty 90, 90d supply, fill #1

## 2021-12-24 MED ORDER — GLIMEPIRIDE 4 MG PO TABS
8.0000 mg | ORAL_TABLET | Freq: Every day | ORAL | 1 refills | Status: DC
  Filled 2021-12-27: qty 180, 90d supply, fill #0
  Filled 2022-03-12 – 2022-03-23 (×2): qty 180, 90d supply, fill #1

## 2021-12-24 MED ORDER — TRUE METRIX BLOOD GLUCOSE TEST VI STRP
ORAL_STRIP | 12 refills | Status: DC
  Filled 2021-12-24: qty 100, 33d supply, fill #0
  Filled 2022-03-23: qty 100, 33d supply, fill #1
  Filled 2022-05-02: qty 100, 33d supply, fill #2
  Filled 2022-06-27: qty 100, 33d supply, fill #3
  Filled 2022-09-08 – 2022-09-10 (×6): qty 100, 33d supply, fill #4

## 2021-12-24 MED ORDER — TRUE METRIX METER W/DEVICE KIT
PACK | 0 refills | Status: DC
  Filled 2021-12-24: qty 1, 33d supply, fill #0

## 2021-12-24 MED ORDER — TRULICITY 0.75 MG/0.5ML ~~LOC~~ SOAJ
0.7500 mg | SUBCUTANEOUS | 1 refills | Status: DC
  Filled 2021-12-24: qty 2, 28d supply, fill #0
  Filled 2021-12-25 – 2022-01-15 (×2): qty 2, 28d supply, fill #1

## 2021-12-24 MED ORDER — AMLODIPINE BESYLATE 10 MG PO TABS
10.0000 mg | ORAL_TABLET | Freq: Every day | ORAL | 1 refills | Status: DC
  Filled 2021-12-27: qty 90, 90d supply, fill #0
  Filled 2022-03-23 – 2022-05-02 (×2): qty 90, 90d supply, fill #1

## 2021-12-24 MED ORDER — LISINOPRIL 10 MG PO TABS
10.0000 mg | ORAL_TABLET | Freq: Every day | ORAL | 1 refills | Status: DC
  Filled 2021-12-27: qty 90, 90d supply, fill #0
  Filled 2022-03-23 – 2022-05-02 (×2): qty 90, 90d supply, fill #1

## 2021-12-24 MED ORDER — TRUEPLUS LANCETS 28G MISC
3 refills | Status: DC
  Filled 2021-12-24: qty 100, 33d supply, fill #0
  Filled 2022-03-23: qty 100, 33d supply, fill #1
  Filled 2022-05-02: qty 100, 33d supply, fill #2
  Filled 2022-06-27: qty 100, 33d supply, fill #3

## 2021-12-24 MED ORDER — ATORVASTATIN CALCIUM 20 MG PO TABS
20.0000 mg | ORAL_TABLET | Freq: Every day | ORAL | 2 refills | Status: DC
  Filled 2021-12-27: qty 90, 90d supply, fill #0
  Filled 2022-03-23 – 2022-05-02 (×2): qty 90, 90d supply, fill #1
  Filled 2022-09-08 – 2022-09-10 (×3): qty 90, 90d supply, fill #2

## 2021-12-24 NOTE — Progress Notes (Signed)
Assessment & Plan:  [place] was seen today for hypertension.  Diagnoses and all orders for this visit:  Primary hypertension -     CMP14+EGFR -     lisinopril (ZESTRIL) 10 MG tablet; Take 1 tablet (10 mg total) by mouth daily. -     amLODipine (NORVASC) 10 MG tablet; Take 1 tablet (10 mg total) by mouth daily.  Type 2 diabetes mellitus with hyperglycemia, without long-term current use of insulin (HCC) ADDED TRULICITY TODAY -     ZJI96+VELF -     Microalbumin / creatinine urine ratio -     POCT glycosylated hemoglobin (Hb A1C) -     POCT glucose (manual entry) -     metFORMIN (GLUCOPHAGE) 500 MG tablet; Take 1 tablet (500 mg total) by mouth daily with breakfast. -     lisinopril (ZESTRIL) 10 MG tablet; Take 1 tablet (10 mg total) by mouth daily. -     glimepiride (AMARYL) 4 MG tablet; Take 2 tablets (8 mg total) by mouth daily with breakfast. -     Dulaglutide (TRULICITY) 8.10 FB/5.1WC SOPN; Inject 0.75 mg into the skin once a week. NEEDS PASS -     Blood Glucose Monitoring Suppl (TRUE METRIX METER) w/Device KIT; Needs pass. Use as instructed. Check blood glucose level by fingerstick 3 times per day. -     glucose blood (TRUE METRIX BLOOD GLUCOSE TEST) test strip; Needs pass. Use as instructed. Check blood glucose level by fingerstick 3 times per day. -     TRUEplus Lancets 28G MISC; Needs pass. Use as instructed. Check blood glucose level by fingerstick 3 times per day.  Mixed hyperlipidemia -     atorvastatin (LIPITOR) 20 MG tablet; Take 1 tablet (20 mg total) by mouth daily. NEEDS PASS  Colon cancer screening -     Fecal occult blood, imunochemical([facility])  Breast cancer screening by mammogram -     MS DIGITAL SCREENING TOMO BILATERAL; Future    Patient has been counseled on age-appropriate routine health concerns for screening and prevention. These are reviewed and up-to-date. Referrals have been placed accordingly. Immunizations are up-to-date or declined.     Subjective:   Chief Complaint  Patient presents with   Hypertension   Hypertension Pertinent negatives include no blurred vision, chest pain, headaches, malaise/fatigue, palpitations or shortness of breath.   Amanda Tran 56 y.o. female presents to office today for follow up to HTN and DM.   She has a past medical history of Anemia, Anxiety, Diabetes, DVT (03/11/2012), Hyperlipidemia, Hypertension, and Vitamin D deficiency.    HTN Slightly elevated today. She endorses adherence taking lisinopril 10 mg daily and norvasc 10 mg daily. Weight is increasing steadily.  BP Readings from Last 3 Encounters:  12/24/21 (!) 137/94  11/15/21 120/78  09/13/21 (!) 154/103      DM 2 Poorly controlled. She has not been consistently taking Amaryl 8 mg daily or metformin 500 mg daily. Will add Trulicity today 0.$RemoveBeforeDEI'75mg'[id number]$ .  Lab Results  Component Value Date   HGBA1C 10.2 (A) 12/24/2021    Lab Results  Component Value Date   HGBA1C 6.8 (A) 04/27/2021   LDL not at goal with atorvastatin.  Lab Results  Component Value Date   LDLCALC 88 04/27/2021    Review of Systems  Constitutional:  Negative for fever, malaise/fatigue and weight loss.  HENT: Negative.  Negative for nosebleeds.   Eyes: Negative.  Negative for blurred vision, double vision and photophobia.  Respiratory: Negative.  Negative  for cough and shortness of breath.   Cardiovascular: Negative.  Negative for chest pain, palpitations and leg swelling.  Gastrointestinal: Negative.  Negative for heartburn, nausea and vomiting.  Musculoskeletal: Negative.  Negative for myalgias.  Neurological: Negative.  Negative for dizziness, focal weakness, seizures and headaches.  Psychiatric/Behavioral: Negative.  Negative for suicidal ideas.     Past Medical History:  Diagnosis Date   Anemia    Anxiety    Diabetes (HCC)    DVT (deep venous thrombosis) (HCC) 03/11/2012   Left popliteal    Hyperlipidemia    Hypertension    Vitamin D  deficiency     Past Surgical History:  Procedure Laterality Date   CESAREAN SECTION     1990, 1993.1994, 1996   TUBAL LIGATION      Family History  Problem Relation Age of Onset   Hypertension Father    Diabetes Father    Kidney failure Mother    Diabetes Sister    Colon cancer Neg Hx    Esophageal cancer Neg Hx    Stomach cancer Neg Hx    Rectal cancer Neg Hx     Social History Reviewed with no changes to be made today.   Outpatient Medications Prior to Visit  Medication Sig Dispense Refill   escitalopram (LEXAPRO) 10 MG tablet Take 1/2 tablet by mouth daily for 2 weeks and then increase to 1 whole tablet by mouth daily if needed 30 tablet 1   traZODone (DESYREL) 50 MG tablet Take 0.5-1 tablets (25-50 mg total) by mouth at bedtime as needed for sleep. 30 tablet 3   amLODipine (NORVASC) 10 MG tablet Take 10 mg by mouth daily.     Blood Glucose Monitoring Suppl (ONETOUCH VERIO) w/Device KIT Use to check blood sugar once daily. E11.65 1 kit 0   glimepiride (AMARYL) 4 MG tablet Take 2 tablets (8 mg total) by mouth daily with breakfast. 180 tablet 0   glucose blood (ONETOUCH VERIO) test strip Use to check blood sugar once daily. E11.65 100 each 2   lisinopril (ZESTRIL) 10 MG tablet Take 1 tablet by mouth once daily 90 tablet 0   metFORMIN (GLUCOPHAGE) 500 MG tablet Take 1 tablet by mouth once daily with breakfast 90 tablet 0   OneTouch Delica Lancets 33G MISC Use to check blood sugar once daily. E11.65 100 each 2   atorvastatin (LIPITOR) 20 MG tablet Take 1 tablet (20 mg total) by mouth daily. NEEDS PASS 90 tablet 2   clotrimazole (LOTRIMIN) 1 % cream Apply to affected area 2 times daily (Patient not taking: Reported on 12/24/2021) 15 g 0   metroNIDAZOLE (METROGEL VAGINAL) 0.75 % vaginal gel Place 1 Applicatorful vaginally 2 (two) times daily. (Patient not taking: Reported on 12/24/2021) 70 g 0   nystatin-triamcinolone (MYCOLOG II) cream Apply to affected area daily (Patient not  taking: Reported on 12/24/2021) 30 g 0   triamcinolone cream (KENALOG) 0.1 % APPLY TOPICALLY DAILY UNTIL RESOLVED (Patient not taking: Reported on 12/24/2021) 45 g 0   No facility-administered medications prior to visit.    No Known Allergies     Objective:    BP (!) 137/94   Pulse 95   Temp 98 F (36.7 C) (Oral)   Ht 5\' 1"  (1.549 m)   Wt 210 lb 3.2 oz (95.3 kg)   SpO2 98%   BMI 39.72 kg/m  Wt Readings from Last 3 Encounters:  12/24/21 210 lb 3.2 oz (95.3 kg)  11/15/21 210 lb (95.3 kg)  08/29/21 208 lb (94.3 kg)    Physical Exam Vitals and nursing note reviewed.  Constitutional:      Appearance: She is well-developed.  HENT:     Head: Normocephalic and atraumatic.  Cardiovascular:     Rate and Rhythm: Normal rate and regular rhythm.     Heart sounds: Normal heart sounds. No murmur heard.    No friction rub. No gallop.  Pulmonary:     Effort: Pulmonary effort is normal. No tachypnea or respiratory distress.     Breath sounds: Normal breath sounds. No decreased breath sounds, wheezing, rhonchi or rales.  Chest:     Chest wall: No tenderness.  Abdominal:     General: Bowel sounds are normal.     Palpations: Abdomen is soft.  Musculoskeletal:        General: Normal range of motion.     Cervical back: Normal range of motion.  Skin:    General: Skin is warm and dry.  Neurological:     Mental Status: She is alert and oriented to person, place, and time.     Coordination: Coordination normal.  Psychiatric:        Behavior: Behavior normal. Behavior is cooperative.        Thought Content: Thought content normal.        Judgment: Judgment normal.          Patient has been counseled extensively about nutrition and exercise as well as the importance of adherence with medications and regular follow-up. The patient was given clear instructions to go to ER or return to medical center if symptoms don't improve, worsen or new problems develop. The patient verbalized  understanding.   Follow-up: Return for luke meter check 4-6 weeks. See me in 3 months.   [name], FNP-BC [facility] and [place], [place]   12/24/2021, 2:14 PM

## 2021-12-25 ENCOUNTER — Other Ambulatory Visit: Payer: Self-pay

## 2021-12-25 LAB — CMP14+EGFR
ALT: 21 IU/L (ref 0–32)
AST: 24 IU/L (ref 0–40)
Albumin/Globulin Ratio: 1.6 (ref 1.2–2.2)
Albumin: 4.6 g/dL (ref 3.8–4.9)
Alkaline Phosphatase: 128 IU/L — ABNORMAL HIGH (ref 44–121)
BUN/Creatinine Ratio: 12 (ref 9–23)
BUN: 10 mg/dL (ref 6–24)
Bilirubin Total: 0.3 mg/dL (ref 0.0–1.2)
CO2: 22 mmol/L (ref 20–29)
Calcium: 9.9 mg/dL (ref 8.7–10.2)
Chloride: 99 mmol/L (ref 96–106)
Creatinine, Ser: 0.85 mg/dL (ref 0.57–1.00)
Globulin, Total: 2.9 g/dL (ref 1.5–4.5)
Glucose: 151 mg/dL — ABNORMAL HIGH (ref 70–99)
Potassium: 3.9 mmol/L (ref 3.5–5.2)
Sodium: 139 mmol/L (ref 134–144)
Total Protein: 7.5 g/dL (ref 6.0–8.5)
eGFR: 81 mL/min/{1.73_m2} (ref >59)

## 2021-12-25 LAB — MICROALBUMIN / CREATININE URINE RATIO
Creatinine, Urine: 18.3 mg/dL
Microalb/Creat Ratio: <16 mg/g{creat} (ref 0–29)
Microalbumin, Urine: <3 ug/mL

## 2021-12-26 ENCOUNTER — Encounter: Payer: Self-pay | Admitting: Nurse Practitioner

## 2021-12-27 ENCOUNTER — Encounter: Payer: Self-pay | Admitting: Nurse Practitioner

## 2021-12-27 ENCOUNTER — Other Ambulatory Visit: Payer: Self-pay | Admitting: Nurse Practitioner

## 2021-12-27 ENCOUNTER — Other Ambulatory Visit: Payer: Self-pay

## 2021-12-27 DIAGNOSIS — E1165 Type 2 diabetes mellitus with hyperglycemia: Secondary | ICD-10-CM

## 2021-12-27 MED ORDER — ATORVASTATIN CALCIUM 20 MG PO TABS
ORAL_TABLET | ORAL | 1 refills | Status: DC
  Filled 2021-12-27: qty 90, 90d supply, fill #0

## 2021-12-27 NOTE — Telephone Encounter (Signed)
Medication Refill - Medication:  glimepiride (AMARYL) 4 MG tablet  Has the patient contacted their pharmacy? No.  Preferred Pharmacy (with phone number or street name):  [place] Phone:  [phone]  Fax:  [phone]     Has the patient been seen for an appointment in the last year OR does the patient have an upcoming appointment? Yes.    Agent: Please be advised that RX refills may take up to 3 business days. We ask that you follow-up with your pharmacy.  Pt requesting rx [facility]

## 2021-12-27 NOTE — Telephone Encounter (Signed)
Requested Prescriptions  Pending Prescriptions Disp Refills  . glimepiride (AMARYL) 4 MG tablet 180 tablet 1    Sig: Take 2 tablets (8 mg total) by mouth daily with breakfast.     Endocrinology:  Diabetes - Sulfonylureas Failed - 12/27/2021 11:30 AM      Failed - HBA1C is between 0 and 7.9 and within 180 days    Hemoglobin A1C  Date Value Ref Range Status  12/24/2021 10.2 (A) 4.0 - 5.6 % Final   HbA1c, POC (controlled diabetic range)  Date Value Ref Range Status  09/01/2020 7.2 (A) 0.0 - 7.0 % Final         Passed - Cr in normal range and within 360 days    Creat  Date Value Ref Range Status  12/21/2014 0.78 0.50 - 1.10 mg/dL Final   Creatinine, Ser  Date Value Ref Range Status  12/24/2021 0.85 0.57 - 1.00 mg/dL Final         Passed - Valid encounter within last 6 months    Recent Outpatient Visits          3 days ago Primary hypertension   [place], [place] W, NP   4 months ago Type 2 diabetes mellitus with hyperglycemia, without long-term current use of insulin [facility])   [place], [place] W, NP   8 months ago Type 2 diabetes mellitus with hyperglycemia, without long-term current use of insulin [facility])   [place], [place] W, NP   10 months ago Type 2 diabetes mellitus with hyperglycemia, without long-term current use of insulin [facility])   [place], [place] Buff, NP   1 year ago Essential hypertension   [place] [name], NP      Future Appointments            In 1 month Daisy Blossom, Jarome Matin, [place]   In 2 months [name], NP [place]

## 2021-12-28 ENCOUNTER — Other Ambulatory Visit: Payer: Self-pay | Admitting: Nurse Practitioner

## 2022-01-01 ENCOUNTER — Other Ambulatory Visit (INDEPENDENT_AMBULATORY_CARE_PROVIDER_SITE_OTHER): Payer: Self-pay | Admitting: Primary Care

## 2022-01-01 DIAGNOSIS — Z1211 Encounter for screening for malignant neoplasm of colon: Secondary | ICD-10-CM

## 2022-01-01 LAB — FECAL OCCULT BLOOD, IMMUNOCHEMICAL: Fecal Occult Bld: NEGATIVE

## 2022-01-09 ENCOUNTER — Encounter: Payer: Self-pay | Admitting: Nurse Practitioner

## 2022-01-15 ENCOUNTER — Other Ambulatory Visit: Payer: Self-pay

## 2022-01-20 ENCOUNTER — Other Ambulatory Visit: Payer: Self-pay

## 2022-01-20 ENCOUNTER — Emergency Department (HOSPITAL_BASED_OUTPATIENT_CLINIC_OR_DEPARTMENT_OTHER): Payer: Self-pay

## 2022-01-20 ENCOUNTER — Encounter (HOSPITAL_BASED_OUTPATIENT_CLINIC_OR_DEPARTMENT_OTHER): Payer: Self-pay | Admitting: Emergency Medicine

## 2022-01-20 ENCOUNTER — Emergency Department (HOSPITAL_BASED_OUTPATIENT_CLINIC_OR_DEPARTMENT_OTHER)
Admission: EM | Admit: 2022-01-20 | Discharge: 2022-01-20 | Disposition: A | Discharge status: Home / Self Care | Payer: Self-pay | Attending: Emergency Medicine | Admitting: Emergency Medicine

## 2022-01-20 DIAGNOSIS — U071 COVID-19: Secondary | ICD-10-CM | POA: Insufficient documentation

## 2022-01-20 DIAGNOSIS — R1032 Left lower quadrant pain: Secondary | ICD-10-CM | POA: Insufficient documentation

## 2022-01-20 DIAGNOSIS — Z794 Long term (current) use of insulin: Secondary | ICD-10-CM | POA: Insufficient documentation

## 2022-01-20 DIAGNOSIS — H66002 Acute suppurative otitis media without spontaneous rupture of ear drum, left ear: Secondary | ICD-10-CM | POA: Insufficient documentation

## 2022-01-20 DIAGNOSIS — E119 Type 2 diabetes mellitus without complications: Secondary | ICD-10-CM | POA: Insufficient documentation

## 2022-01-20 DIAGNOSIS — Z79899 Other long term (current) drug therapy: Secondary | ICD-10-CM | POA: Insufficient documentation

## 2022-01-20 DIAGNOSIS — I10 Essential (primary) hypertension: Secondary | ICD-10-CM | POA: Insufficient documentation

## 2022-01-20 DIAGNOSIS — R000 Tachycardia, unspecified: Secondary | ICD-10-CM | POA: Insufficient documentation

## 2022-01-20 DIAGNOSIS — Z7984 Long term (current) use of oral hypoglycemic drugs: Secondary | ICD-10-CM | POA: Insufficient documentation

## 2022-01-20 LAB — URINALYSIS, ROUTINE W REFLEX MICROSCOPIC
Bilirubin Urine: NEGATIVE
Glucose, UA: NEGATIVE mg/dL
Ketones, ur: NEGATIVE mg/dL
Leukocytes,Ua: NEGATIVE
Nitrite: NEGATIVE
Protein, ur: NEGATIVE mg/dL
Specific Gravity, Urine: 1.01 (ref 1.005–1.030)
pH: 5.5 (ref 5.0–8.0)

## 2022-01-20 LAB — COMPREHENSIVE METABOLIC PANEL
ALT: 23 U/L (ref 0–44)
AST: 26 U/L (ref 15–41)
Albumin: 3.9 g/dL (ref 3.5–5.0)
Alkaline Phosphatase: 101 U/L (ref 38–126)
Anion gap: 8 (ref 5–15)
BUN: 13 mg/dL (ref 6–20)
CO2: 24 mmol/L (ref 22–32)
Calcium: 8.9 mg/dL (ref 8.9–10.3)
Chloride: 108 mmol/L (ref 98–111)
Creatinine, Ser: 0.96 mg/dL (ref 0.44–1.00)
GFR, Estimated: >60 mL/min (ref >60)
Glucose, Bld: 112 mg/dL — ABNORMAL HIGH (ref 70–99)
Potassium: 3.8 mmol/L (ref 3.5–5.1)
Sodium: 140 mmol/L (ref 135–145)
Total Bilirubin: 0.6 mg/dL (ref 0.3–1.2)
Total Protein: 7.9 g/dL (ref 6.5–8.1)

## 2022-01-20 LAB — RESP PANEL BY RT-PCR (FLU A&B, COVID) ARPGX2
Influenza A by PCR: NEGATIVE
Influenza B by PCR: NEGATIVE
SARS Coronavirus 2 by RT PCR: POSITIVE — AB

## 2022-01-20 LAB — CBC WITH DIFFERENTIAL/PLATELET
Abs Immature Granulocytes: 0.02 10*3/uL (ref 0.00–0.07)
Basophils Absolute: 0 10*3/uL (ref 0.0–0.1)
Basophils Relative: 0 %
Eosinophils Absolute: 0 10*3/uL (ref 0.0–0.5)
Eosinophils Relative: 1 %
HCT: 41.6 % (ref 36.0–46.0)
Hemoglobin: 13.3 g/dL (ref 12.0–15.0)
Immature Granulocytes: 0 %
Lymphocytes Relative: 13 %
Lymphs Abs: 0.8 10*3/uL (ref 0.7–4.0)
MCH: 26.6 pg (ref 26.0–34.0)
MCHC: 32 g/dL (ref 30.0–36.0)
MCV: 83.2 fL (ref 80.0–100.0)
Monocytes Absolute: 0.6 10*3/uL (ref 0.1–1.0)
Monocytes Relative: 10 %
Neutro Abs: 4.4 10*3/uL (ref 1.7–7.7)
Neutrophils Relative %: 76 %
Platelets: 240 10*3/uL (ref 150–400)
RBC: 5 MIL/uL (ref 3.87–5.11)
RDW: 13.9 % (ref 11.5–15.5)
WBC: 5.8 10*3/uL (ref 4.0–10.5)
nRBC: 0 % (ref 0.0–0.2)

## 2022-01-20 LAB — LACTIC ACID, PLASMA: Lactic Acid, Venous: 1.1 mmol/L (ref 0.5–1.9)

## 2022-01-20 LAB — URINALYSIS, MICROSCOPIC (REFLEX): WBC, UA: NONE SEEN WBC/hpf (ref 0–5)

## 2022-01-20 MED ORDER — KETOROLAC TROMETHAMINE 30 MG/ML IJ SOLN
30.0000 mg | Freq: Once | INTRAMUSCULAR | Status: AC
  Administered 2022-01-20: 30 mg via INTRAVENOUS
  Filled 2022-01-20: qty 1

## 2022-01-20 MED ORDER — IOHEXOL 300 MG/ML  SOLN
100.0000 mL | Freq: Once | INTRAMUSCULAR | Status: AC | PRN
  Administered 2022-01-20: 100 mL via INTRAVENOUS

## 2022-01-20 MED ORDER — AMOXICILLIN-POT CLAVULANATE 875-125 MG PO TABS: 1.0000 | ORAL_TABLET | Freq: Two times a day (BID) | ORAL | 0 refills | Status: DC

## 2022-01-20 MED ORDER — AMOXICILLIN-POT CLAVULANATE 875-125 MG PO TABS
1.0000 | ORAL_TABLET | Freq: Two times a day (BID) | ORAL | 0 refills | Status: AC
  Filled 2022-01-21: qty 20, 10d supply, fill #0

## 2022-01-20 MED ORDER — NIRMATRELVIR/RITONAVIR (PAXLOVID)TABLET: 3.0000 | ORAL_TABLET | Freq: Two times a day (BID) | ORAL | 0 refills | Status: DC

## 2022-01-20 MED ORDER — NIRMATRELVIR/RITONAVIR (PAXLOVID)TABLET: 3.0000 | ORAL_TABLET | Freq: Two times a day (BID) | ORAL | 0 refills | Status: AC

## 2022-01-20 MED ORDER — LACTATED RINGERS IV BOLUS
1000.0000 mL | Freq: Once | INTRAVENOUS | Status: AC
  Administered 2022-01-20: 1000 mL via INTRAVENOUS

## 2022-01-20 MED ORDER — ACETAMINOPHEN 500 MG PO TABS
1000.0000 mg | ORAL_TABLET | Freq: Once | ORAL | Status: AC | PRN
  Administered 2022-01-20: 1000 mg via ORAL
  Filled 2022-01-20: qty 2

## 2022-01-20 MED ORDER — ONDANSETRON 4 MG PO TBDP
4.0000 mg | ORAL_TABLET | Freq: Three times a day (TID) | ORAL | 0 refills | Status: DC | PRN
  Filled 2022-01-21: qty 20, 7d supply, fill #0

## 2022-01-20 NOTE — ED Triage Notes (Signed)
Pt arrives pov, states "Im not feeling well". Pt c/o Congestion, LT ear pain, and gen. Body aches with chills starting yesterday. Denies otc meds today

## 2022-01-20 NOTE — ED Notes (Signed)
Patient up to restroom without incident  - placed back in bed and on continuous cardiac monitoring

## 2022-01-20 NOTE — ED Provider Notes (Signed)
[facility] Provider Note   CSN: [id number] Arrival date & time: 01/20/22  0907     History {Add pertinent medical, surgical, social history, OB history to HPI:1} Chief Complaint  Patient presents with   Fever    Amanda Tran is a 56 y.o. female.  HPI      56 year old female with a history of hypertension, type 2 diabetes, hyperlipidemia, prior left popliteal DVT, presents with concern for fever, congestion, body aches.  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  Reports immediately after working yesterday at 6 PM she started to develop symptoms of mild cough, congestion, left ear pain, fever, chills, nausea.  Also reports she had abdominal pain yesterday in the lower abdomen.  Denies dysuria, chest pain, shortness of breath, vomiting, headache, diarrhea or constipation.  No known sick contacts Past Medical History:  Diagnosis Date   Anemia    Anxiety    Diabetes ([place])    DVT (deep venous thrombosis) ([place]) 03/11/2012   Left popliteal    Hyperlipidemia    Hypertension    Vitamin D deficiency      Home Medications Prior to Admission medications   Medication Sig Start Date End Date Taking? Authorizing Provider  amLODipine (NORVASC) 10 MG tablet Take 1 tablet (10 mg total) by mouth daily. 12/24/21   [name], NP  atorvastatin (LIPITOR) 20 MG tablet Take 1 tablet (20 mg total) by mouth daily. 12/24/21   [name], NP  Blood Glucose Monitoring Suppl (TRUE METRIX METER) w/Device KIT Use as instructed to heck blood glucose level by fingerstick 3 times per day. 12/24/21   [name], NP  Dulaglutide (TRULICITY) 0.81 KG/8.1EH SOPN Inject 0.75 mg into the skin once a week. 12/24/21   [name], NP  escitalopram (LEXAPRO) 10 MG tablet Take 1/2 tablet by mouth daily for 2 weeks and then increase to 1 whole tablet by mouth daily if needed 11/13/21    [name], NP  glimepiride (AMARYL) 4 MG tablet Take 2 tablets (8 mg total) by mouth daily with breakfast. 12/24/21   [name], NP  glucose blood (TRUE METRIX BLOOD GLUCOSE TEST) test strip Use as instructed to check blood glucose levels by fingerstick 3 times per day. 12/24/21   [name], NP  lisinopril (ZESTRIL) 10 MG tablet Take 1 tablet (10 mg total) by mouth daily. 12/24/21   [name], NP  metFORMIN (GLUCOPHAGE) 500 MG tablet Take 1 tablet (500 mg total) by mouth daily with breakfast. 12/24/21   [name], NP  traZODone (DESYREL) 50 MG tablet Take 0.5-1 tablets (25-50 mg total) by mouth at bedtime as needed for sleep. 11/13/21   [name], NP  TRUEplus Lancets 28G MISC Use as instructed to check blood glucose level by fingerstick 3 times per day. 12/24/21   [name], NP      Allergies    Patient has no known allergies.    Review of Systems   Review of Systems  Physical Exam Updated Vital Signs BP (!) 132/95   Pulse (!) 121   Temp (!) 101.8 F (38.8 C) (Oral)   Resp (!) 23   Ht _0  (1.549 m)   Wt 95.3 kg   SpO2 92%   BMI 39.68 kg/m  Physical Exam Vitals and nursing note reviewed.  Constitutional:  Appearance: She is well-developed. She is ill-appearing. She is not diaphoretic.  HENT:     Head: Normocephalic and atraumatic.  Eyes:     Conjunctiva/sclera: Conjunctivae normal.  Cardiovascular:     Rate and Rhythm: Regular rhythm. Tachycardia present.     Heart sounds: Normal heart sounds. No murmur heard.    No friction rub. No gallop.  Pulmonary:     Effort: Pulmonary effort is normal. No respiratory distress.     Breath sounds: Normal breath sounds. No wheezing or rales.  Abdominal:     General: There is no distension.     Palpations: Abdomen is soft.     Tenderness: There is abdominal tenderness (LLQ). There is no guarding.  Musculoskeletal:        General: No tenderness.     Cervical back: Normal range of  motion.  Skin:    General: Skin is warm and dry.     Findings: No erythema or rash.  Neurological:     Mental Status: She is alert and oriented to person, place, and time.     ED Results / Procedures / Treatments   Labs (all labs ordered are listed, but only abnormal results are displayed) Labs Reviewed  RESP PANEL BY RT-PCR (FLU A&B, COVID) ARPGX2  URINE CULTURE  LACTIC ACID, PLASMA  LACTIC ACID, PLASMA  COMPREHENSIVE METABOLIC PANEL  CBC WITH DIFFERENTIAL/PLATELET  URINALYSIS, ROUTINE W REFLEX MICROSCOPIC    EKG None  Radiology DG Chest Port 1 View  Result Date: 01/20/2022 CLINICAL DATA:  Tachycardia.  Patient not feeling well. EXAM: PORTABLE CHEST 1 VIEW COMPARISON:  November 15, 2021 FINDINGS: The heart, hila, and mediastinum are normal. No nodules or masses. Haziness over the bases thought to be due to overlapping soft tissues. No definitive infiltrate. IMPRESSION: Haziness over the bases thought to be due to overlapping soft tissues. No definitive infiltrate. Electronically Signed   By: Dorise Bullion III M.D.   On: 01/20/2022 09:56    Procedures Procedures  {Document cardiac monitor, telemetry assessment procedure when appropriate:1}  Medications Ordered in ED Medications  acetaminophen (TYLENOL) tablet 1,000 mg (has no administration in time range)  lactated ringers bolus 1,000 mL (has no administration in time range)    ED Course/ Medical Decision Making/ A&P                            56 year old female with a history of hypertension, type 2 diabetes, hyperlipidemia, prior left popliteal DVT, presents with concern for fever, congestion, body aches.   {Document critical care time when appropriate:1} {Document review of labs and clinical decision tools ie heart score, Chads2Vasc2 etc:1}  {Document your independent review of radiology images, and any outside records:1} {Document your discussion with family members, caretakers, and with consultants:1} {Document  social determinants of health affecting pt's care:1} {Document your decision making why or why not admission, treatments were needed:1} Final Clinical Impression(s) / ED Diagnoses Final diagnoses:  None    Rx / DC Orders ED Discharge Orders     None

## 2022-01-20 NOTE — Discharge Instructions (Addendum)
Don't take your atorvastatin until 2-3 days after treatment completion

## 2022-01-21 ENCOUNTER — Other Ambulatory Visit (HOSPITAL_COMMUNITY): Payer: Self-pay

## 2022-01-21 ENCOUNTER — Other Ambulatory Visit: Payer: Self-pay

## 2022-01-21 LAB — URINE CULTURE: Culture: NO GROWTH

## 2022-01-22 ENCOUNTER — Other Ambulatory Visit: Payer: Self-pay

## 2022-01-31 ENCOUNTER — Ambulatory Visit
Admission: RE | Admit: 2022-01-31 | Discharge: 2022-01-31 | Disposition: A | Discharge status: Home / Self Care | Payer: No Typology Code available for payment source | Source: Ambulatory Visit | Attending: Nurse Practitioner | Admitting: Nurse Practitioner

## 2022-01-31 DIAGNOSIS — Z1231 Encounter for screening mammogram for malignant neoplasm of breast: Secondary | ICD-10-CM

## 2022-02-04 ENCOUNTER — Ambulatory Visit: Payer: 59 | Admitting: Pharmacist

## 2022-02-08 ENCOUNTER — Other Ambulatory Visit: Payer: Self-pay | Admitting: Nurse Practitioner

## 2022-02-08 ENCOUNTER — Encounter: Payer: Self-pay | Admitting: Nurse Practitioner

## 2022-02-08 DIAGNOSIS — R053 Chronic cough: Secondary | ICD-10-CM

## 2022-02-08 MED ORDER — ALBUTEROL SULFATE HFA 108 (90 BASE) MCG/ACT IN AERS
2.0000 | INHALATION_SPRAY | Freq: Four times a day (QID) | RESPIRATORY_TRACT | 0 refills | Status: DC | PRN
  Filled 2022-02-08: qty 6.7, 25d supply, fill #0

## 2022-02-11 ENCOUNTER — Other Ambulatory Visit: Payer: Self-pay | Admitting: Nurse Practitioner

## 2022-02-11 ENCOUNTER — Other Ambulatory Visit: Payer: Self-pay

## 2022-02-11 DIAGNOSIS — R053 Chronic cough: Secondary | ICD-10-CM

## 2022-02-11 MED ORDER — ALBUTEROL SULFATE HFA 108 (90 BASE) MCG/ACT IN AERS
2.0000 | INHALATION_SPRAY | Freq: Four times a day (QID) | RESPIRATORY_TRACT | 0 refills | Status: DC | PRN
  Filled 2022-04-10: qty 6.7, 25d supply, fill #0

## 2022-02-12 ENCOUNTER — Other Ambulatory Visit: Payer: Self-pay

## 2022-02-12 ENCOUNTER — Other Ambulatory Visit: Payer: Self-pay | Admitting: Nurse Practitioner

## 2022-02-12 DIAGNOSIS — E1165 Type 2 diabetes mellitus with hyperglycemia: Secondary | ICD-10-CM

## 2022-02-12 MED ORDER — TRULICITY 0.75 MG/0.5ML ~~LOC~~ SOAJ
0.7500 mg | SUBCUTANEOUS | 1 refills | Status: DC
  Filled 2022-02-12 – 2022-02-19 (×2): qty 2, 28d supply, fill #0
  Filled 2022-03-12: qty 2, 28d supply, fill #1

## 2022-02-13 ENCOUNTER — Other Ambulatory Visit: Payer: Self-pay

## 2022-02-15 ENCOUNTER — Telehealth: Payer: Self-pay

## 2022-02-15 ENCOUNTER — Other Ambulatory Visit: Payer: Self-pay

## 2022-02-15 NOTE — Telephone Encounter (Signed)
Approved until 02/14/2023

## 2022-02-15 NOTE — Telephone Encounter (Signed)
Prior authorization for Trulicity submitted today via CoverMyMeds KEY: [id number]

## 2022-02-18 ENCOUNTER — Encounter: Payer: Self-pay | Admitting: Nurse Practitioner

## 2022-02-18 ENCOUNTER — Other Ambulatory Visit: Payer: Self-pay

## 2022-02-19 ENCOUNTER — Other Ambulatory Visit: Payer: Self-pay

## 2022-03-07 ENCOUNTER — Ambulatory Visit: Payer: 59 | Admitting: Pharmacist

## 2022-03-12 ENCOUNTER — Other Ambulatory Visit: Payer: Self-pay

## 2022-03-26 ENCOUNTER — Other Ambulatory Visit: Payer: Self-pay

## 2022-03-26 ENCOUNTER — Ambulatory Visit: Payer: Self-pay | Attending: Nurse Practitioner | Admitting: Nurse Practitioner

## 2022-03-29 ENCOUNTER — Other Ambulatory Visit: Payer: Self-pay

## 2022-04-01 ENCOUNTER — Other Ambulatory Visit: Payer: Self-pay

## 2022-04-04 ENCOUNTER — Telehealth: Payer: Self-pay | Admitting: Emergency Medicine

## 2022-04-04 NOTE — Telephone Encounter (Signed)
Copied from [place] [id number]. Topic: General - Other >> Apr 04, 2022 11:37 AM Cyndi Bender wrote: Reason for CRM: Pt stated she was told to come in for labs but the only order showing is from 04/27/21. Pt requests that labs be ordered so she can schedule lab appt. Pt requests call back

## 2022-04-05 ENCOUNTER — Other Ambulatory Visit: Payer: Self-pay

## 2022-04-05 DIAGNOSIS — E1165 Type 2 diabetes mellitus with hyperglycemia: Secondary | ICD-10-CM

## 2022-04-05 NOTE — Telephone Encounter (Signed)
Return call unanswered.  There is no record of anyone called for patient to come in for lab work.

## 2022-04-10 ENCOUNTER — Other Ambulatory Visit: Payer: Self-pay

## 2022-04-10 ENCOUNTER — Other Ambulatory Visit: Payer: Self-pay | Admitting: Family Medicine

## 2022-04-10 DIAGNOSIS — E1165 Type 2 diabetes mellitus with hyperglycemia: Secondary | ICD-10-CM

## 2022-04-10 MED ORDER — TRULICITY 0.75 MG/0.5ML ~~LOC~~ SOAJ
0.7500 mg | SUBCUTANEOUS | 1 refills | Status: DC
  Filled 2022-04-10: qty 2, 28d supply, fill #0
  Filled 2022-05-06 (×2): qty 2, 28d supply, fill #1

## 2022-05-02 ENCOUNTER — Other Ambulatory Visit: Payer: Self-pay

## 2022-05-06 ENCOUNTER — Other Ambulatory Visit: Payer: Self-pay

## 2022-05-08 NOTE — Progress Notes (Signed)
S:     PCP: [name]  57 y.o. female who presents for diabetes evaluation, education, and management.  PMH is significant for HTN, iron deficiency anemia, T2DM.  Patient was referred and last seen by Primary Care Provider, [name], on 12/24/2021. She was referred to me but has not been able to make her appt until today. At last visit, BP was elevated at 137/94. A1c increased to 10.2% at that time. Patient was initiated on Trulicity.  Today, patient arrives in good spirits and presents without any assistance.   Patient reports Diabetes is longstanding. She has no hx of clinical ASCVD, CHF, or CKD. No hx of pancreatitis or thyroid cancer. She is tolerating the Trulicity well. Compliant with this, glimepiride and metformin.   Patient reports that she is compliant with her antihypertensives. Denies any HA, blurred vision, dizziness, or chest pain. She is not able to check her BP at home.   Family/Social History:  -Fhx: CKD, HTN, DM -Tobacco: former (quit 2019) -Alcohol: endorses  Current diabetes medications include: Trulcity 0.40m once weekly, glimepiride 880monce daily, metformin 50041mnce daily  Current hypertension medications include: amlodipine 27m87mce daily, lisinopril 27mg67me daily Current hyperlipidemia medications include: atorvastatin 20mg 76m daily  Patient reports adherence to taking all medications as prescribed.   Insurance coverage: none  Patient denies hypoglycemic events. Tells me that hypoglycemia occurred initially with the addition of Trulicity to her regimen but nothing recent.  Reported home fasting blood sugars: reports 110s-130s. Rare 160s - low 200s.    Patient denies nocturia (nighttime urination).  Patient denies neuropathy (nerve pain). Patient reports visual changes. Patient reports self foot exams.   Patient reported dietary habits:  -Has eliminated red meat from her diet.  -Reports cutting out carbs and eating mainly salad with  protein. Does eat out for lunch daily.  -No sweets.  -Will occasionally drink a regular Ginger Ale.   Patient-reported exercise habits:  -None reported outside of work.   O:  Lab Results  Component Value Date   HGBA1C 10.2 (A) 12/24/2021   Vitals:   05/09/22 1605  BP: 126/86  Pulse: (!) 101   Lipid Panel     Component Value Date/Time   CHOL 143 04/27/2021 1004   TRIG 88 04/27/2021 1004   HDL 38 (L) 04/27/2021 1004   CHOLHDL 3.8 04/27/2021 1004   CHOLHDL 4.9 03/23/2014 0952   VLDL 11 03/23/2014 0952   LDLCALC 88 04/27/2021 1004    Clinical Atherosclerotic Cardiovascular Disease (ASCVD): No  The 10-year ASCVD risk score (Arnett DK, et al., 2019) is: 10.9%   Values used to calculate the score:     Age: 8 yea42     Sex: Female     Is Non-Hispanic African American: Yes     Diabetic: Yes     Tobacco smoker: No     Systolic Blood Pressure: 126 [id number]    Is BP treated: Yes     HDL Cholesterol: 38 mg/dL     Total Cholesterol: 143 mg/dL     A/P: Diabetes longstanding currently above goal based on A1c but home sugars look good. Will get an updated A1c today. She sees her PCP later this month. Patient is able to verbalize appropriate hypoglycemia management plan. Medication adherence appears appropriate. -Continued current regimen. We will get a new A1c and adjust regimen accordingly.  -Extensively discussed pathophysiology of diabetes, recommended lifestyle interventions, dietary effects on blood sugar control.  -Counseled on s/sx of  and management of hypoglycemia.  -A1c.   ASCVD risk - primary prevention in patient with diabetes. Last LDL is not at goal of <70 mg/dL. ASCVD risk score of 10.9%. Will get lipid panel today.  -Continued atorvastatin 20 mg.   Hypertension longstanding currently close to goal. Blood pressure goal of <130/80 mmHg. Medication adherence reported. -Continue current regimen.  Written patient instructions provided. Patient verbalized understanding  of treatment plan.  Total time in face to face counseling 30 minutes.    Follow-up:  PCP clinic visit in 05/22/2022  Benard Halsted, PharmD, [place], [place] [phone]

## 2022-05-09 ENCOUNTER — Ambulatory Visit: Payer: Self-pay | Attending: Nurse Practitioner | Admitting: Pharmacist

## 2022-05-09 ENCOUNTER — Encounter: Payer: Self-pay | Admitting: Pharmacist

## 2022-05-09 ENCOUNTER — Other Ambulatory Visit: Payer: Self-pay

## 2022-05-09 VITALS — BP 126/86 | HR 101

## 2022-05-09 DIAGNOSIS — I10 Essential (primary) hypertension: Secondary | ICD-10-CM

## 2022-05-09 DIAGNOSIS — E1165 Type 2 diabetes mellitus with hyperglycemia: Secondary | ICD-10-CM

## 2022-05-09 MED ORDER — TRULICITY 0.75 MG/0.5ML ~~LOC~~ SOAJ
0.7500 mg | SUBCUTANEOUS | 1 refills | Status: DC
  Filled 2022-05-30 – 2022-06-07 (×2): qty 2, 28d supply, fill #0
  Filled 2022-07-01 (×2): qty 2, 28d supply, fill #1

## 2022-05-09 MED ORDER — AMLODIPINE BESYLATE 10 MG PO TABS
10.0000 mg | ORAL_TABLET | Freq: Every day | ORAL | 1 refills | Status: DC
  Filled 2022-05-09: qty 90, 90d supply, fill #0
  Filled 2022-05-30 – 2022-08-01 (×2): qty 30, 30d supply, fill #0
  Filled 2022-09-08 – 2022-09-10 (×4): qty 30, 30d supply, fill #1
  Filled 2022-10-08: qty 30, 30d supply, fill #2
  Filled 2022-11-21: qty 30, 30d supply, fill #3

## 2022-05-09 MED ORDER — LISINOPRIL 10 MG PO TABS
10.0000 mg | ORAL_TABLET | Freq: Every day | ORAL | 1 refills | Status: DC
  Filled 2022-05-09: qty 90, 90d supply, fill #0
  Filled 2022-08-01: qty 30, 30d supply, fill #0
  Filled 2022-09-08 – 2022-09-10 (×4): qty 30, 30d supply, fill #1
  Filled 2022-10-12 – 2022-11-21 (×2): qty 30, 30d supply, fill #2

## 2022-05-09 MED ORDER — METFORMIN HCL 500 MG PO TABS
500.0000 mg | ORAL_TABLET | Freq: Every day | ORAL | 1 refills | Status: DC
  Filled 2022-05-09: qty 90, 90d supply, fill #0
  Filled 2022-06-27: qty 30, 30d supply, fill #0
  Filled 2022-08-01 (×3): qty 30, 30d supply, fill #1
  Filled 2022-09-10: qty 30, 30d supply, fill #2
  Filled 2022-10-08: qty 30, 30d supply, fill #3

## 2022-05-09 MED ORDER — GLIMEPIRIDE 4 MG PO TABS
8.0000 mg | ORAL_TABLET | Freq: Every day | ORAL | 1 refills | Status: DC
  Filled 2022-05-09: qty 180, 90d supply, fill #0
  Filled 2022-06-27: qty 60, 30d supply, fill #0
  Filled 2022-08-01 (×3): qty 60, 30d supply, fill #1
  Filled 2022-09-08 – 2022-09-10 (×4): qty 60, 30d supply, fill #2
  Filled 2022-10-08: qty 60, 30d supply, fill #3
  Filled 2022-11-12 (×2): qty 60, 30d supply, fill #4

## 2022-05-10 LAB — LIPID PANEL
Chol/HDL Ratio: 4.3 ratio (ref 0.0–4.4)
Cholesterol, Total: 167 mg/dL (ref 100–199)
HDL: 39 mg/dL — ABNORMAL LOW (ref >39)
LDL Chol Calc (NIH): 106 mg/dL — ABNORMAL HIGH (ref 0–99)
Triglycerides: 122 mg/dL (ref 0–149)
VLDL Cholesterol Cal: 22 mg/dL (ref 5–40)

## 2022-05-10 LAB — HEMOGLOBIN A1C
Est. average glucose Bld gHb Est-mCnc: 146 mg/dL
Hgb A1c MFr Bld: 6.7 % — ABNORMAL HIGH (ref 4.8–5.6)

## 2022-05-22 ENCOUNTER — Ambulatory Visit: Payer: Self-pay | Attending: Nurse Practitioner | Admitting: Nurse Practitioner

## 2022-05-22 ENCOUNTER — Encounter: Payer: Self-pay | Admitting: Nurse Practitioner

## 2022-05-22 VITALS — BP 125/86 | HR 107 | Ht 61.0 in | Wt 220.2 lb

## 2022-05-22 DIAGNOSIS — Z23 Encounter for immunization: Secondary | ICD-10-CM

## 2022-05-22 DIAGNOSIS — I10 Essential (primary) hypertension: Secondary | ICD-10-CM

## 2022-05-22 DIAGNOSIS — E1165 Type 2 diabetes mellitus with hyperglycemia: Secondary | ICD-10-CM

## 2022-05-22 NOTE — Progress Notes (Signed)
Assessment & Plan:  Amanda Tran was seen today for hypertension.  Diagnoses and all orders for this visit:  Primary hypertension -     CMP14+EGFR Continue all antihypertensives as prescribed.  Reminded to bring in blood pressure log for follow  up appointment.  RECOMMENDATIONS: DASH/Mediterranean Diets are healthier choices for HTN.    Type 2 diabetes mellitus with hyperglycemia, without long-term current use of insulin (HCC) -     CMP14+EGFR Continue blood sugar control as discussed in office today, low carbohydrate diet, and regular physical exercise as tolerated, 150 minutes per week (30 min each day, 5 days per week, or 50 min 3 days per week). Keep blood sugar logs with fasting goal of 90-130 mg/dl, post prandial (after you eat) less than 180.  For Hypoglycemia: BS <60 and Hyperglycemia BS >400; contact the clinic ASAP. Annual eye exams and foot exams are recommended.   Need for shingles vaccine -     Varicella-zoster vaccine IM    Patient has been counseled on age-appropriate routine health concerns for screening and prevention. These are reviewed and up-to-date. Referrals have been placed accordingly. Immunizations are up-to-date or declined.    Subjective:   Chief Complaint  Patient presents with   Hypertension    [name] 57 y.o. female presents to office today for follow-up to hypertension. She also has questions today regarding healthy lifestyle management and ways to lower cholesterol.  She has a past medical history of Anemia, Anxiety, Diabetes, DVT (03/11/2012), Hyperlipidemia, Hypertension, and Vitamin D deficiency.     HTN Well controlled today. She endorses adherence taking lisinopril 10 mg daily and norvasc 10 mg daily. Weight is increasing steadily.   BP Readings from Last 3 Encounters:  05/22/22 125/86  05/09/22 126/86  01/20/22 116/76    DM 2 Well controlled with glimepiride 8 mg daily, metformin 500 mg daily, and Trulicity A999333 mg  weekly Lab Results  Component Value Date   HGBA1C 6.7 (H) 05/09/2022  LDL not at goal with atorvastatin 20 mg daily.  Lab Results  Component Value Date   LDLCALC 106 (H) 05/09/2022    Review of Systems  Constitutional:  Negative for fever, malaise/fatigue and weight loss.  HENT: Negative.  Negative for nosebleeds.   Eyes: Negative.  Negative for blurred vision, double vision and photophobia.  Respiratory: Negative.  Negative for cough and shortness of breath.   Cardiovascular: Negative.  Negative for chest pain, palpitations and leg swelling.  Gastrointestinal: Negative.  Negative for heartburn, nausea and vomiting.  Musculoskeletal: Negative.  Negative for myalgias.  Neurological: Negative.  Negative for dizziness, focal weakness, seizures and headaches.  Psychiatric/Behavioral: Negative.  Negative for suicidal ideas.     Past Medical History:  Diagnosis Date   Anemia    Anxiety    Diabetes ([place])    DVT (deep venous thrombosis) ([place]) 03/11/2012   Left popliteal    Hyperlipidemia    Hypertension    Vitamin D deficiency     Past Surgical History:  Procedure Laterality Date   CESAREAN SECTION     1990, 1993.1994, 1996   TUBAL LIGATION      Family History  Problem Relation Age of Onset   Kidney failure Mother    Hypertension Father    Diabetes Father    Diabetes Sister    Colon cancer Neg Hx    Esophageal cancer Neg Hx    Stomach cancer Neg Hx    Rectal cancer Neg Hx    Breast cancer  Neg Hx     Social History Reviewed with no changes to be made today.   Outpatient Medications Prior to Visit  Medication Sig Dispense Refill   albuterol (VENTOLIN HFA) 108 (90 Base) MCG/ACT inhaler Inhale 2 puffs into the lungs every 6 (six) hours as needed for wheezing or shortness of breath. 6.7 g 0   amLODipine (NORVASC) 10 MG tablet Take 1 tablet (10 mg total) by mouth daily. 90 tablet 1   atorvastatin (LIPITOR) 20 MG tablet Take 1 tablet (20 mg total) by mouth daily. 90 tablet  2   Blood Glucose Monitoring Suppl (TRUE METRIX METER) w/Device KIT Use as instructed to heck blood glucose level by fingerstick 3 times per day. 1 kit 0   Dulaglutide (TRULICITY) A999333 0000000 SOPN Inject 0.75 mg into the skin once a week. 2 mL 1   glimepiride (AMARYL) 4 MG tablet Take 2 tablets (8 mg total) by mouth daily with breakfast. 180 tablet 1   glucose blood (TRUE METRIX BLOOD GLUCOSE TEST) test strip Use as instructed to check blood glucose levels by fingerstick 3 times per day. 100 each 12   lisinopril (ZESTRIL) 10 MG tablet Take 1 tablet (10 mg total) by mouth daily. 90 tablet 1   metFORMIN (GLUCOPHAGE) 500 MG tablet Take 1 tablet (500 mg total) by mouth daily with breakfast. 90 tablet 1   TRUEplus Lancets 28G MISC Use as instructed to check blood glucose level by fingerstick 3 times per day. 100 each 3   escitalopram (LEXAPRO) 10 MG tablet Take 1/2 tablet by mouth daily for 2 weeks and then increase to 1 whole tablet by mouth daily if needed (Patient not taking: Reported on 05/22/2022) 30 tablet 1   ondansetron (ZOFRAN-ODT) 4 MG disintegrating tablet Take 1 tablet (4 mg total) by mouth every 8 (eight) hours as needed for nausea or vomiting. 20 tablet 0   traZODone (DESYREL) 50 MG tablet Take 0.5-1 tablets (25-50 mg total) by mouth at bedtime as needed for sleep. (Patient not taking: Reported on 05/22/2022) 30 tablet 3   No facility-administered medications prior to visit.    No Known Allergies     Objective:    BP 125/86   Pulse (!) 107   Ht '5\' 1"'$  (1.549 m)   Wt 220 lb 3.2 oz (99.9 kg)   LMP 03/18/2022 (Approximate)   SpO2 97%   BMI 41.61 kg/m  Wt Readings from Last 3 Encounters:  05/22/22 220 lb 3.2 oz (99.9 kg)  01/20/22 210 lb (95.3 kg)  12/24/21 210 lb 3.2 oz (95.3 kg)    Physical Exam Vitals and nursing note reviewed.  Constitutional:      Appearance: She is well-developed.  HENT:     Head: Normocephalic and atraumatic.  Cardiovascular:     Rate and Rhythm:  Regular rhythm. Tachycardia present.     Heart sounds: Normal heart sounds. No murmur heard.    No friction rub. No gallop.  Pulmonary:     Effort: Pulmonary effort is normal. No tachypnea or respiratory distress.     Breath sounds: Normal breath sounds. No decreased breath sounds, wheezing, rhonchi or rales.  Chest:     Chest wall: No tenderness.  Abdominal:     General: Bowel sounds are normal.     Palpations: Abdomen is soft.  Musculoskeletal:        General: Normal range of motion.     Cervical back: Normal range of motion.  Skin:    General: Skin is  warm and dry.  Neurological:     Mental Status: She is alert and oriented to person, place, and time.     Coordination: Coordination normal.  Psychiatric:        Behavior: Behavior normal. Behavior is cooperative.        Thought Content: Thought content normal.        Judgment: Judgment normal.          Patient has been counseled extensively about nutrition and exercise as well as the importance of adherence with medications and regular follow-up. The patient was given clear instructions to go to ER or return to medical center if symptoms don't improve, worsen or new problems develop. The patient verbalized understanding.   Follow-up: Return in about 3 months (around 08/20/2022).   [name], FNP-BC [facility] [place], [place]   05/22/2022, 5:31 PM

## 2022-05-23 LAB — CMP14+EGFR
ALT: 26 IU/L (ref 0–32)
AST: 25 IU/L (ref 0–40)
Albumin/Globulin Ratio: 1.5 (ref 1.2–2.2)
Albumin: 4.5 g/dL (ref 3.8–4.9)
Alkaline Phosphatase: 126 IU/L — ABNORMAL HIGH (ref 44–121)
BUN/Creatinine Ratio: 12 (ref 9–23)
BUN: 14 mg/dL (ref 6–24)
Bilirubin Total: <0.2 mg/dL (ref 0.0–1.2)
CO2: 24 mmol/L (ref 20–29)
Calcium: 9.9 mg/dL (ref 8.7–10.2)
Chloride: 103 mmol/L (ref 96–106)
Creatinine, Ser: 1.15 mg/dL — ABNORMAL HIGH (ref 0.57–1.00)
Globulin, Total: 3 g/dL (ref 1.5–4.5)
Glucose: 100 mg/dL — ABNORMAL HIGH (ref 70–99)
Potassium: 4 mmol/L (ref 3.5–5.2)
Sodium: 142 mmol/L (ref 134–144)
Total Protein: 7.5 g/dL (ref 6.0–8.5)
eGFR: 57 mL/min/{1.73_m2} — ABNORMAL LOW (ref >59)

## 2022-05-30 ENCOUNTER — Other Ambulatory Visit: Payer: Self-pay

## 2022-06-06 ENCOUNTER — Other Ambulatory Visit: Payer: Self-pay

## 2022-06-07 ENCOUNTER — Other Ambulatory Visit: Payer: Self-pay

## 2022-06-21 ENCOUNTER — Other Ambulatory Visit: Payer: Self-pay

## 2022-06-21 ENCOUNTER — Emergency Department (HOSPITAL_BASED_OUTPATIENT_CLINIC_OR_DEPARTMENT_OTHER): Payer: Self-pay

## 2022-06-21 ENCOUNTER — Encounter: Payer: Self-pay | Admitting: Nurse Practitioner

## 2022-06-21 ENCOUNTER — Emergency Department (HOSPITAL_BASED_OUTPATIENT_CLINIC_OR_DEPARTMENT_OTHER)
Admission: EM | Admit: 2022-06-21 | Discharge: 2022-06-21 | Disposition: A | Discharge status: Home / Self Care | Payer: Self-pay | Attending: Emergency Medicine | Admitting: Emergency Medicine

## 2022-06-21 ENCOUNTER — Encounter (HOSPITAL_BASED_OUTPATIENT_CLINIC_OR_DEPARTMENT_OTHER): Payer: Self-pay | Admitting: Emergency Medicine

## 2022-06-21 DIAGNOSIS — R600 Localized edema: Secondary | ICD-10-CM | POA: Insufficient documentation

## 2022-06-21 DIAGNOSIS — M79605 Pain in left leg: Secondary | ICD-10-CM

## 2022-06-21 DIAGNOSIS — R000 Tachycardia, unspecified: Secondary | ICD-10-CM | POA: Insufficient documentation

## 2022-06-21 MED ORDER — METHOCARBAMOL 500 MG PO TABS
500.0000 mg | ORAL_TABLET | Freq: Two times a day (BID) | ORAL | 0 refills | Status: DC
  Filled 2022-06-21 – 2022-07-03 (×2): qty 20, 10d supply, fill #0

## 2022-06-21 MED ORDER — MELOXICAM 7.5 MG PO TABS
7.5000 mg | ORAL_TABLET | Freq: Every day | ORAL | 0 refills | Status: DC
  Filled 2022-06-21 – 2022-07-03 (×2): qty 7, 7d supply, fill #0

## 2022-06-21 NOTE — ED Provider Notes (Signed)
[place] [facility] Provider Note   CSN: [id number] Arrival date & time: 06/21/22  1941     History  Chief Complaint  Patient presents with   Leg Pain    [place] is a 57 y.o. female.  Patient with remote history of DVT and 2013, patient is unsure what caused the DVT --presents to the emergency department today for evaluation of left lower extremity pain in the proximal calf and thigh as well as subjective swelling.  Patient states that she has been dealing with this "for months".  She denies injuries.  No redness or warmth of the leg.  Pain is worse with movement and walking.  She does not have any symptoms in the right leg.  She states that that her current symptoms feel different than previous DVT because then she had pain primarily in her feet.  She denies associated chest pain or shortness of breath.       Home Medications Prior to Admission medications   Medication Sig Start Date End Date Taking? Authorizing Provider  albuterol (VENTOLIN HFA) 108 (90 Base) MCG/ACT inhaler Inhale 2 puffs into the lungs every 6 (six) hours as needed for wheezing or shortness of breath. 02/11/22   [name], NP  amLODipine (NORVASC) 10 MG tablet Take 1 tablet (10 mg total) by mouth daily. 05/09/22   Charlott Rakes, MD  atorvastatin (LIPITOR) 20 MG tablet Take 1 tablet (20 mg total) by mouth daily. 12/24/21   [name], NP  Blood Glucose Monitoring Suppl (TRUE METRIX METER) w/Device KIT Use as instructed to heck blood glucose level by fingerstick 3 times per day. 12/24/21   [name], NP  Dulaglutide (TRULICITY) A999333 0000000 SOPN Inject 0.75 mg into the skin once a week. 05/09/22   Charlott Rakes, MD  escitalopram (LEXAPRO) 10 MG tablet Take 1/2 tablet by mouth daily for 2 weeks and then increase to 1 whole tablet by mouth daily if needed Patient not taking: Reported on 05/22/2022 11/13/21   [name], NP  glimepiride  (AMARYL) 4 MG tablet Take 2 tablets (8 mg total) by mouth daily with breakfast. 05/09/22   Charlott Rakes, MD  glucose blood (TRUE METRIX BLOOD GLUCOSE TEST) test strip Use as instructed to check blood glucose levels by fingerstick 3 times per day. 12/24/21   [name], NP  lisinopril (ZESTRIL) 10 MG tablet Take 1 tablet (10 mg total) by mouth daily. 05/09/22   Charlott Rakes, MD  metFORMIN (GLUCOPHAGE) 500 MG tablet Take 1 tablet (500 mg total) by mouth daily with breakfast. 05/09/22   Charlott Rakes, MD  ondansetron (ZOFRAN-ODT) 4 MG disintegrating tablet Take 1 tablet (4 mg total) by mouth every 8 (eight) hours as needed for nausea or vomiting. 01/20/22   Gareth Morgan, MD  traZODone (DESYREL) 50 MG tablet Take 0.5-1 tablets (25-50 mg total) by mouth at bedtime as needed for sleep. Patient not taking: Reported on 05/22/2022 11/13/21   [name], NP  TRUEplus Lancets 28G MISC Use as instructed to check blood glucose level by fingerstick 3 times per day. 12/24/21   [name], NP      Allergies    Patient has no known allergies.    Review of Systems   Review of Systems  Physical Exam Updated Vital Signs BP (!) 137/90 (BP Location: Right Arm)   Pulse (!) 108   Temp 98.2 F (36.8 C) (Oral)   Resp 18  Ht 5\' 1"  (1.549 m)   Wt 99.8 kg   LMP 03/18/2022 (Approximate)   SpO2 98%   BMI 41.57 kg/m  Physical Exam Vitals and nursing note reviewed.  Constitutional:      General: She is not in acute distress.    Appearance: She is well-developed.  HENT:     Head: Normocephalic and atraumatic.     Right Ear: External ear normal.     Left Ear: External ear normal.     Nose: Nose normal.  Eyes:     Conjunctiva/sclera: Conjunctivae normal.  Cardiovascular:     Rate and Rhythm: Regular rhythm. Tachycardia present.     Heart sounds: No murmur heard.    Comments: 2+ DP pulses bilaterally Pulmonary:     Effort: No respiratory distress.     Breath sounds: No wheezing,  rhonchi or rales.  Abdominal:     Palpations: Abdomen is soft.     Tenderness: There is no abdominal tenderness. There is no guarding or rebound.  Musculoskeletal:     Cervical back: Normal range of motion and neck supple.     Right lower leg: Edema present.     Left lower leg: Edema present.     Comments: Calves appear grossly symmetric, mild tenderness to palpation of the left proximal calf and distal thigh, no erythema.  Skin:    General: Skin is warm and dry.     Findings: No rash.  Neurological:     General: No focal deficit present.     Mental Status: She is alert. Mental status is at baseline.     Motor: No weakness.  Psychiatric:        Mood and Affect: Mood normal.     ED Results / Procedures / Treatments   Labs (all labs ordered are listed, but only abnormal results are displayed) Labs Reviewed - No data to display  EKG None  Radiology No results found.  Procedures Procedures    Medications Ordered in ED Medications - No data to display  ED Course/ Medical Decision Making/ A&P    Patient seen and examined. History obtained directly from patient.   Labs/EKG: None ordered  Imaging: Ordered DVT study, left lower extremity.  Medications/Fluids: None ordered  Most recent vital signs reviewed and are as follows: BP (!) 137/90 (BP Location: Right Arm)   Pulse (!) 108   Temp 98.2 F (36.8 C) (Oral)   Resp 18   Ht 5\' 1"  (1.549 m)   Wt 99.8 kg   LMP 03/18/2022 (Approximate)   SpO2 98%   BMI 41.57 kg/m   Initial impression: Left lower extremity pain    Reassessment performed. Patient appears stable.  Imaging results reviewed including: DVT study, no DVT or other significant findings.  Reviewed pertinent lab work and imaging with patient at bedside. Questions answered.   Most current vital signs reviewed and are as follows: BP 130/85 (BP Location: Right Arm)   Pulse 95   Temp 98.2 F (36.8 C) (Oral)   Resp 18   Ht 5\' 1"  (1.549 m)   Wt 99.8  kg   LMP 03/18/2022 (Approximate)   SpO2 100%   BMI 41.57 kg/m   Plan: Discharge to home.   Prescriptions written for: Robaxin, meloxicam  Patient counseled on proper use of muscle relaxant medication.  They were told not to drink alcohol, drive any vehicle, or do any dangerous activities while taking this medication.  Patient verbalized understanding.  Other home care instructions discussed:  Heating pad, rest  ED return instructions discussed: New or worsening symptoms  Follow-up instructions discussed: Patient encouraged to follow-up with their PCP in 3 days.                             Medical Decision Making Risk Prescription drug management.   Patient with leg pain, left leg, ongoing for several months.  She has good pedal pulses and I have low concern for arterial insufficiency.  Her symptoms are not consistent with claudication.  No signs of cellulitis.  Concern for DVT given history of same, negative ultrasound here tonight.  Will treat as musculoskeletal pain at this time and have patient follow-up with PCP for continued evaluation.  The patient's vital signs, pertinent lab work and imaging were reviewed and interpreted as discussed in the ED course. Hospitalization was considered for further testing, treatments, or serial exams/observation. However as patient is well-appearing, has a stable exam, and reassuring studies today, I do not feel that they warrant admission at this time. This plan was discussed with the patient who verbalizes agreement and comfort with this plan and seems reliable and able to return to the Emergency Department with worsening or changing symptoms.          Final Clinical Impression(s) / ED Diagnoses Final diagnoses:  Left leg pain    Rx / DC Orders ED Discharge Orders          Ordered    methocarbamol (ROBAXIN) 500 MG tablet  2 times daily        06/21/22 2137    meloxicam (MOBIC) 7.5 MG tablet  Daily        06/21/22 2137               Carlisle Cater, PA-C 06/21/22 [place], [place] K, DO 06/21/22 2318

## 2022-06-21 NOTE — ED Triage Notes (Signed)
Pt reports left leg pain from the inner thigh to the calf for over a month, reports pain increases with walking or movement, hx of DVTs

## 2022-06-21 NOTE — ED Notes (Signed)
Pain started a month or so ago per Pt. In the L leg.  Pt. Reports with turning the L leg it really hurts in the side and up to the hip on the L side.

## 2022-06-21 NOTE — Discharge Instructions (Signed)
Please read and follow all provided instructions.  Your diagnoses today include:  1. Left leg pain     Tests performed today include: Vital signs. See below for your results today.  Ultrasound of your left leg: Did not show any signs of blood clot today  Medications prescribed:  Robaxin (methocarbamol) - muscle relaxer medication  DO NOT drive or perform any activities that require you to be awake and alert because this medicine can make you drowsy.   Meloxicam - anti-inflammatory pain medication  You have been prescribed an anti-inflammatory medication or NSAID. Take with food. Do not take aspirin, ibuprofen, or naproxen if taking this medication. Take smallest effective dose for the shortest duration needed for your pain. Stop taking if you experience stomach pain or vomiting.   Home care instructions:  Follow any educational materials contained in this packet.  Follow-up instructions: Please follow-up with your primary care provider next week for further evaluation of your symptoms.  Return instructions:  Please return to the Emergency Department if you experience worsening symptoms.  Please return if you have any other emergent concerns.  Additional Information:  Your vital signs today were: BP (!) 137/90 (BP Location: Right Arm)   Pulse (!) 108   Temp 98.2 F (36.8 C) (Oral)   Resp 18   Ht 5\' 1"  (1.549 m)   Wt 99.8 kg   LMP 03/18/2022 (Approximate)   SpO2 98%   BMI 41.57 kg/m  If your blood pressure (BP) was elevated above 135/85 this visit, please have this repeated by your doctor within one month. ---------------

## 2022-06-21 NOTE — ED Notes (Signed)
Discharge paperwork reviewed entirely with patient, including Rx's and follow up care. Pain was under control. Pt verbalized understanding as well as all parties involved. No questions or concerns voiced at the time of discharge. No acute distress noted.   Pt ambulated out to PVA without incident or assistance.  

## 2022-06-22 ENCOUNTER — Other Ambulatory Visit: Payer: Self-pay

## 2022-06-24 ENCOUNTER — Other Ambulatory Visit: Payer: Self-pay

## 2022-06-27 ENCOUNTER — Encounter: Payer: Self-pay | Admitting: Pharmacist

## 2022-06-27 ENCOUNTER — Other Ambulatory Visit: Payer: Self-pay

## 2022-06-28 ENCOUNTER — Other Ambulatory Visit: Payer: Self-pay

## 2022-07-01 ENCOUNTER — Other Ambulatory Visit: Payer: Self-pay

## 2022-07-02 ENCOUNTER — Other Ambulatory Visit: Payer: Self-pay

## 2022-07-03 ENCOUNTER — Other Ambulatory Visit: Payer: Self-pay

## 2022-07-03 ENCOUNTER — Other Ambulatory Visit (HOSPITAL_COMMUNITY): Payer: Self-pay

## 2022-07-08 ENCOUNTER — Other Ambulatory Visit: Payer: Self-pay | Admitting: Nurse Practitioner

## 2022-07-08 ENCOUNTER — Other Ambulatory Visit (HOSPITAL_COMMUNITY): Payer: Self-pay

## 2022-07-08 ENCOUNTER — Other Ambulatory Visit: Payer: Self-pay

## 2022-07-08 DIAGNOSIS — R053 Chronic cough: Secondary | ICD-10-CM

## 2022-07-09 ENCOUNTER — Encounter (HOSPITAL_COMMUNITY): Payer: Self-pay

## 2022-07-09 ENCOUNTER — Other Ambulatory Visit (HOSPITAL_COMMUNITY): Payer: Self-pay

## 2022-07-09 ENCOUNTER — Other Ambulatory Visit: Payer: Self-pay

## 2022-07-09 MED ORDER — ALBUTEROL SULFATE HFA 108 (90 BASE) MCG/ACT IN AERS
2.0000 | INHALATION_SPRAY | Freq: Four times a day (QID) | RESPIRATORY_TRACT | 0 refills | Status: DC | PRN
  Filled 2022-07-09 (×2): qty 6.7, 25d supply, fill #0

## 2022-07-09 NOTE — Telephone Encounter (Signed)
Requested Prescriptions  Pending Prescriptions Disp Refills   albuterol (VENTOLIN HFA) 108 (90 Base) MCG/ACT inhaler 6.7 g 0    Sig: Inhale 2 puffs into the lungs every 6 (six) hours as needed for wheezing or shortness of breath.     Pulmonology:  Beta Agonists 2 Passed - 07/08/2022  9:20 AM      Passed - Last BP in normal range    BP Readings from Last 1 Encounters:  06/21/22 130/85         Passed - Last Heart Rate in normal range    Pulse Readings from Last 1 Encounters:  06/21/22 95         Passed - Valid encounter within last 12 months    Recent Outpatient Visits           1 month ago Primary hypertension   [facility] [place], [place] W, NP   2 months ago Type 2 diabetes mellitus with hyperglycemia, without long-term current use of insulin [facility])   [place] [facility] [place], Cornelius Moras, RPH-CPP   6 months ago Primary hypertension   [facility] [place], [place] W, NP   11 months ago Type 2 diabetes mellitus with hyperglycemia, without long-term current use of insulin [facility])   [place] [facility] [place], [place] W, NP   1 year ago Type 2 diabetes mellitus with hyperglycemia, without long-term current use of insulin [facility])    [facility] [place], Shea Stakes, NP       Future Appointments             In 1 month Claiborne Rigg, NP [facility]

## 2022-07-10 ENCOUNTER — Other Ambulatory Visit: Payer: Self-pay

## 2022-07-10 ENCOUNTER — Encounter (HOSPITAL_COMMUNITY): Payer: Self-pay

## 2022-07-10 ENCOUNTER — Other Ambulatory Visit (HOSPITAL_COMMUNITY): Payer: Self-pay

## 2022-07-10 ENCOUNTER — Emergency Department (HOSPITAL_COMMUNITY)
Admission: EM | Admit: 2022-07-10 | Discharge: 2022-07-10 | Disposition: A | Discharge status: Home / Self Care | Payer: Self-pay | Attending: Emergency Medicine | Admitting: Emergency Medicine

## 2022-07-10 ENCOUNTER — Encounter: Payer: Self-pay | Admitting: Nurse Practitioner

## 2022-07-10 DIAGNOSIS — L959 Vasculitis limited to the skin, unspecified: Secondary | ICD-10-CM | POA: Insufficient documentation

## 2022-07-10 DIAGNOSIS — I10 Essential (primary) hypertension: Secondary | ICD-10-CM | POA: Insufficient documentation

## 2022-07-10 DIAGNOSIS — R600 Localized edema: Secondary | ICD-10-CM

## 2022-07-10 DIAGNOSIS — Z79899 Other long term (current) drug therapy: Secondary | ICD-10-CM | POA: Insufficient documentation

## 2022-07-10 DIAGNOSIS — Z7984 Long term (current) use of oral hypoglycemic drugs: Secondary | ICD-10-CM | POA: Insufficient documentation

## 2022-07-10 DIAGNOSIS — E119 Type 2 diabetes mellitus without complications: Secondary | ICD-10-CM | POA: Insufficient documentation

## 2022-07-10 LAB — CBC WITH DIFFERENTIAL/PLATELET
Abs Immature Granulocytes: 0.02 10*3/uL (ref 0.00–0.07)
Basophils Absolute: 0 10*3/uL (ref 0.0–0.1)
Basophils Relative: 1 %
Eosinophils Absolute: 0.1 10*3/uL (ref 0.0–0.5)
Eosinophils Relative: 2 %
HCT: 42.2 % (ref 36.0–46.0)
Hemoglobin: 13.2 g/dL (ref 12.0–15.0)
Immature Granulocytes: 0 %
Lymphocytes Relative: 24 %
Lymphs Abs: 1.4 10*3/uL (ref 0.7–4.0)
MCH: 25.7 pg — ABNORMAL LOW (ref 26.0–34.0)
MCHC: 31.3 g/dL (ref 30.0–36.0)
MCV: 82.1 fL (ref 80.0–100.0)
Monocytes Absolute: 0.3 10*3/uL (ref 0.1–1.0)
Monocytes Relative: 5 %
Neutro Abs: 4.1 10*3/uL (ref 1.7–7.7)
Neutrophils Relative %: 68 %
Platelets: 286 10*3/uL (ref 150–400)
RBC: 5.14 MIL/uL — ABNORMAL HIGH (ref 3.87–5.11)
RDW: 14.9 % (ref 11.5–15.5)
WBC: 5.9 10*3/uL (ref 4.0–10.5)
nRBC: 0 % (ref 0.0–0.2)

## 2022-07-10 LAB — BASIC METABOLIC PANEL
Anion gap: 11 (ref 5–15)
BUN: 13 mg/dL (ref 6–20)
CO2: 24 mmol/L (ref 22–32)
Calcium: 9 mg/dL (ref 8.9–10.3)
Chloride: 106 mmol/L (ref 98–111)
Creatinine, Ser: 0.88 mg/dL (ref 0.44–1.00)
GFR, Estimated: >60 mL/min (ref >60)
Glucose, Bld: 155 mg/dL — ABNORMAL HIGH (ref 70–99)
Potassium: 3.8 mmol/L (ref 3.5–5.1)
Sodium: 141 mmol/L (ref 135–145)

## 2022-07-10 LAB — C-REACTIVE PROTEIN: CRP: 2.3 mg/dL — ABNORMAL HIGH (ref <1.0)

## 2022-07-10 LAB — SEDIMENTATION RATE: Sed Rate: 10 mm/hr (ref 0–22)

## 2022-07-10 MED ORDER — IBUPROFEN 200 MG PO TABS
400.0000 mg | ORAL_TABLET | Freq: Once | ORAL | Status: AC
  Administered 2022-07-10: 400 mg via ORAL
  Filled 2022-07-10: qty 2

## 2022-07-10 MED ORDER — ACETAMINOPHEN 500 MG PO TABS
1000.0000 mg | ORAL_TABLET | Freq: Once | ORAL | Status: AC
  Administered 2022-07-10: 1000 mg via ORAL
  Filled 2022-07-10: qty 2

## 2022-07-10 MED ORDER — PREDNISONE 20 MG PO TABS
40.0000 mg | ORAL_TABLET | Freq: Every day | ORAL | 0 refills | Status: DC
  Filled 2022-07-10: qty 10, 5d supply, fill #0

## 2022-07-10 NOTE — ED Triage Notes (Signed)
Patient is here for evaluation of bilateral lower leg swelling. Patient states last night she had swelling to her knees after working all night. States this morning woke up and noticed the redness to the bilateral legs. Pt states she thinks that she may have been bit by a snake. No puncture wounds seen in triage. Red rash noted to bilateral lower legs.

## 2022-07-10 NOTE — Telephone Encounter (Signed)
Call returned to patient. Patient was told that the labs have not been reviewed and someone will reach out to her when they have been reviewed.

## 2022-07-10 NOTE — ED Provider Notes (Signed)
[place] EMERGENCY DEPARTMENT AT [facility] Provider Note   CSN: [id number] Arrival date & time: 07/10/22  4098     History  Chief Complaint  Patient presents with   Leg Swelling    Amanda Tran is a 57 y.o. female.  Patient is a 57 year old female with a history of hypertension, hyperlipidemia, diabetes, anemia, DVT in the left popliteal in 2013 no longer on anticoagulation presenting today with bilateral leg pain and swelling.  Patient reports that her left leg has been hurting for a while she was seen in the emergency room on 06/18/2022 for that pain had an ultrasound which was negative for DVT reports that she has been hurting intermittently and she is taken meloxicam but the symptoms today started yesterday.  She works for a [employer] and she was working last night and after she finished work and noticed that her left leg was very swollen and painful.  She did rest and elevate her legs but it bothered her throughout the night however this morning when she looked at her legs she noticed that there were red splotches on both of her legs.  She does not recall if her right lower extremity was swollen last night because it was not hurting her.  When she was at work she did see a snake but does not think she was bit by it.  She cannot recall anything biting her.  She was wearing pants when she was cleaning and denies spilling any chemicals or anything else on her legs.  She is not taking any new medications except for occasional meloxicam for her knee pain.  She has not had any recent illness.  No history of prior symptoms.  The history is provided by the patient and medical records.       Home Medications Prior to Admission medications   Medication Sig Start Date End Date Taking? Authorizing Provider  predniSONE (DELTASONE) 20 MG tablet Take 2 tablets (40 mg total) by mouth daily. 07/10/22  Yes Quinlan Mcfall, Alphonzo Lemmings, MD  albuterol (VENTOLIN HFA) 108 (90 Base)  MCG/ACT inhaler Inhale 2 puffs into the lungs every 6 (six) hours as needed for wheezing or shortness of breath. 07/09/22   Claiborne Rigg, NP  amLODipine (NORVASC) 10 MG tablet Take 1 tablet (10 mg total) by mouth daily. 05/09/22   Hoy Register, MD  atorvastatin (LIPITOR) 20 MG tablet Take 1 tablet (20 mg total) by mouth daily. 12/24/21   Claiborne Rigg, NP  Blood Glucose Monitoring Suppl (TRUE METRIX METER) w/Device KIT Use as instructed to heck blood glucose level by fingerstick 3 times per day. 12/24/21   Claiborne Rigg, NP  Dulaglutide (TRULICITY) 0.75 MG/0.5ML SOPN Inject 0.75 mg into the skin once a week. 05/09/22   Hoy Register, MD  glimepiride (AMARYL) 4 MG tablet Take 2 tablets (8 mg total) by mouth daily with breakfast. 05/09/22   Hoy Register, MD  glucose blood (TRUE METRIX BLOOD GLUCOSE TEST) test strip Use as instructed to check blood glucose levels by fingerstick 3 times per day. 12/24/21   Claiborne Rigg, NP  lisinopril (ZESTRIL) 10 MG tablet Take 1 tablet (10 mg total) by mouth daily. 05/09/22   Hoy Register, MD  meloxicam (MOBIC) 7.5 MG tablet Take 1 tablet (7.5 mg total) by mouth daily. 06/21/22   Renne Crigler, PA-C  metFORMIN (GLUCOPHAGE) 500 MG tablet Take 1 tablet (500 mg total) by mouth daily with breakfast. 05/09/22   Hoy Register, MD  methocarbamol (  ROBAXIN) 500 MG tablet Take 1 tablet (500 mg total) by mouth 2 (two) times daily. 06/21/22   Renne Crigler, PA-C  ondansetron (ZOFRAN-ODT) 4 MG disintegrating tablet Take 1 tablet (4 mg total) by mouth every 8 (eight) hours as needed for nausea or vomiting. 01/20/22   Alvira Monday, MD  TRUEplus Lancets 28G MISC Use as instructed to check blood glucose level by fingerstick 3 times per day. 12/24/21   Claiborne Rigg, NP      Allergies    Patient has no known allergies.    Review of Systems   Review of Systems  Physical Exam Updated Vital Signs BP (!) 116/91 (BP Location: Left Arm)   Pulse 82   Temp 98 F  (36.7 C) (Oral)   Resp 16   Ht 5\' 1"  (1.549 m)   Wt 99.8 kg   SpO2 100%   BMI 41.57 kg/m  Physical Exam Vitals and nursing note reviewed.  Constitutional:      General: She is not in acute distress.    Appearance: She is well-developed.  HENT:     Head: Normocephalic and atraumatic.  Eyes:     Pupils: Pupils are equal, round, and reactive to light.  Cardiovascular:     Rate and Rhythm: Normal rate and regular rhythm.     Heart sounds: Normal heart sounds. No murmur heard.    No friction rub.  Pulmonary:     Effort: Pulmonary effort is normal.     Breath sounds: Normal breath sounds. No wheezing or rales.  Abdominal:     General: Bowel sounds are normal. There is no distension.     Palpations: Abdomen is soft.     Tenderness: There is no abdominal tenderness. There is no guarding or rebound.  Musculoskeletal:        General: Tenderness present. Normal range of motion.     Comments: No edema  Skin:    General: Skin is warm and dry.     Findings: Rash present.     Comments: Bilateral lower extremities with erythematous rash noted over the medial aspect of both tib-fib regions.  Tender to the touch, nonblanching but not raised at this time.  Knots specifically warm and no lesions or drainage.  No induration noted.  Left leg is greater than right.  2+ DP pulses bilaterally  Neurological:     Mental Status: She is alert and oriented to person, place, and time. Mental status is at baseline.     Cranial Nerves: No cranial nerve deficit.  Psychiatric:        Mood and Affect: Mood normal.        Behavior: Behavior normal.     ED Results / Procedures / Treatments   Labs (all labs ordered are listed, but only abnormal results are displayed) Labs Reviewed  CBC WITH DIFFERENTIAL/PLATELET - Abnormal; Notable for the following components:      Result Value   RBC 5.14 (*)    MCH 25.7 (*)    All other components within normal limits  BASIC METABOLIC PANEL - Abnormal; Notable for  the following components:   Glucose, Bld 155 (*)    All other components within normal limits  SEDIMENTATION RATE  C-REACTIVE PROTEIN    EKG None  Radiology No results found.  Procedures Procedures    Medications Ordered in ED Medications  acetaminophen (TYLENOL) tablet 1,000 mg (1,000 mg Oral Given 07/10/22 0949)  ibuprofen (ADVIL) tablet 400 mg (400 mg Oral Given 07/10/22  1610)    ED Course/ Medical Decision Making/ A&P                             Medical Decision Making Amount and/or Complexity of Data Reviewed Labs: ordered.  Risk OTC drugs. Prescription drug management.   Pt with multiple medical problems and comorbidities and presenting today with a complaint that caries a high risk for morbidity and mortality.  Here today with swelling in her leg and new rash that started last night.  Rash is tender and nonblanching.  No systemic symptoms at this time.  Lower suspicion for a bite as there is no local area suggestive of a bite mark and symptoms are bilateral.  Also this makes cellulitis unlikely as well.  Concern for possible vasculitis.  Patient had DVT study done less than a month ago for pain in her left lower extremity which was negative and low suspicion for DVT today.  Will check inflammatory markers.   1:00 PM Patient CBC, BMP without acute findings, sed rate is normal at 10.  CRP is still pending.  However at this time we will have patient follow-up with her PCP for results of CRP.  Will treat for presumed vasculitis as patient symptoms are not classic for infection there is been no significant change while here.  Normal platelet count and findings are not consistent with purpura.  Findings discussed with the patient.  She is comfortable with this plan.  Encouraged elevation wearing compression socks and given return precautions.  Patient was given a short course of prednisone.        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

## 2022-07-10 NOTE — Discharge Instructions (Addendum)
Elevate your legs today.  Start the steroids.  Get some compression socks for your legs when you are at work.  If you start getting wounds on the legs or drainage, fever, change in color of your feet and pain becomes severe return for repeat evaluation.

## 2022-07-10 NOTE — ED Notes (Signed)
Called to follow up about labs for this pt.

## 2022-07-11 ENCOUNTER — Other Ambulatory Visit: Payer: Self-pay

## 2022-07-11 ENCOUNTER — Encounter: Payer: Self-pay | Admitting: Nurse Practitioner

## 2022-07-19 ENCOUNTER — Encounter: Payer: Self-pay | Admitting: Nurse Practitioner

## 2022-08-01 ENCOUNTER — Other Ambulatory Visit (HOSPITAL_COMMUNITY): Payer: Self-pay

## 2022-08-01 ENCOUNTER — Other Ambulatory Visit: Payer: Self-pay

## 2022-08-01 ENCOUNTER — Other Ambulatory Visit: Payer: Self-pay | Admitting: Family Medicine

## 2022-08-01 DIAGNOSIS — E1165 Type 2 diabetes mellitus with hyperglycemia: Secondary | ICD-10-CM

## 2022-08-01 MED ORDER — TRULICITY 0.75 MG/0.5ML ~~LOC~~ SOAJ
0.7500 mg | SUBCUTANEOUS | 1 refills | Status: DC
  Filled 2022-08-01: qty 2, 28d supply, fill #0

## 2022-08-01 NOTE — Telephone Encounter (Signed)
Requested Prescriptions  Pending Prescriptions Disp Refills   Dulaglutide (TRULICITY) 0.75 MG/0.5ML SOPN 2 mL 1    Sig: Inject 0.75 mg into the skin once a week.     Endocrinology:  Diabetes - GLP-1 Receptor Agonists Passed - 08/01/2022  8:55 AM      Passed - HBA1C is between 0 and 7.9 and within 180 days    HbA1c, POC (controlled diabetic range)  Date Value Ref Range Status  09/01/2020 7.2 (A) 0.0 - 7.0 % Final   Hgb A1c MFr Bld  Date Value Ref Range Status  05/09/2022 6.7 (H) 4.8 - 5.6 % Final    Comment:             Prediabetes: 5.7 - 6.4          Diabetes: >6.4          Glycemic control for adults with diabetes: <7.0          Passed - Valid encounter within last 6 months    Recent Outpatient Visits           2 months ago Primary hypertension   [facility] [place], [place] W, NP   2 months ago Type 2 diabetes mellitus with hyperglycemia, without long-term current use of insulin [facility])   [place] [facility] [place], Cornelius Moras, RPH-CPP   7 months ago Primary hypertension   [place] [facility] [place], [place] W, NP   1 year ago Type 2 diabetes mellitus with hyperglycemia, without long-term current use of insulin [facility])   [place] [facility] [place], [place] W, NP   1 year ago Type 2 diabetes mellitus with hyperglycemia, without long-term current use of insulin [facility])   [place] [facility] [place], Shea Stakes, NP       Future Appointments             In 3 weeks Claiborne Rigg, NP [facility]

## 2022-08-06 ENCOUNTER — Other Ambulatory Visit: Payer: Self-pay

## 2022-08-13 ENCOUNTER — Encounter: Payer: Self-pay | Admitting: Nurse Practitioner

## 2022-08-21 ENCOUNTER — Encounter: Payer: Self-pay | Admitting: Gastroenterology

## 2022-08-22 ENCOUNTER — Encounter: Payer: Self-pay | Admitting: Nurse Practitioner

## 2022-08-22 ENCOUNTER — Other Ambulatory Visit: Payer: Self-pay

## 2022-08-22 ENCOUNTER — Ambulatory Visit: Payer: Self-pay | Attending: Nurse Practitioner | Admitting: Nurse Practitioner

## 2022-08-22 VITALS — BP 129/83 | HR 104 | Ht 61.0 in | Wt 221.2 lb

## 2022-08-22 DIAGNOSIS — L74513 Primary focal hyperhidrosis, soles: Secondary | ICD-10-CM

## 2022-08-22 DIAGNOSIS — Z7985 Long-term (current) use of injectable non-insulin antidiabetic drugs: Secondary | ICD-10-CM

## 2022-08-22 DIAGNOSIS — E1165 Type 2 diabetes mellitus with hyperglycemia: Secondary | ICD-10-CM

## 2022-08-22 DIAGNOSIS — L0292 Furuncle, unspecified: Secondary | ICD-10-CM

## 2022-08-22 DIAGNOSIS — E1142 Type 2 diabetes mellitus with diabetic polyneuropathy: Secondary | ICD-10-CM

## 2022-08-22 DIAGNOSIS — I10 Essential (primary) hypertension: Secondary | ICD-10-CM

## 2022-08-22 MED ORDER — MUPIROCIN 2 % EX OINT
1.0000 | TOPICAL_OINTMENT | Freq: Two times a day (BID) | CUTANEOUS | 10 refills | Status: DC
  Filled 2022-08-22: qty 60, 30d supply, fill #0
  Filled 2022-09-08 – 2022-09-10 (×3): qty 66, 33d supply, fill #1
  Filled 2022-09-10: qty 22, 11d supply, fill #1
  Filled 2022-09-10: qty 66, 33d supply, fill #1
  Filled 2022-09-10: qty 22, 11d supply, fill #1
  Filled 2022-10-08: qty 22, 11d supply, fill #2
  Filled 2022-12-16: qty 22, 11d supply, fill #3
  Filled 2023-05-29: qty 22, 11d supply, fill #4
  Filled 2023-05-30: qty 22, 22d supply, fill #0
  Filled 2023-08-05: qty 22, 22d supply, fill #1

## 2022-08-22 MED ORDER — TRULICITY 1.5 MG/0.5ML ~~LOC~~ SOAJ
1.5000 mg | SUBCUTANEOUS | 3 refills | Status: DC
  Filled 2022-08-22: qty 2, 28d supply, fill #0
  Filled 2022-09-18 (×2): qty 2, 28d supply, fill #1
  Filled 2022-10-12 – 2022-10-21 (×5): qty 2, 28d supply, fill #2

## 2022-08-22 NOTE — Progress Notes (Signed)
Assessment & Plan:  [name] was seen today for hypertension.  Diagnoses and all orders for this visit:  Primary hypertension Continue amlodipine and lisinopril as prescribed Reminded to bring in blood pressure log for follow  up appointment.  RECOMMENDATIONS: DASH/Mediterranean Diets are healthier choices for HTN.    Type 2 diabetes mellitus with hyperglycemia, without long-term current use of insulin  Weight trending up. She is not  -     Hemoglobin A1c -     Thyroid Panel With TSH -     Dulaglutide (TRULICITY) 1.5 MG/0.5ML SOPN; Inject 1.5 mg into the skin once a week. Continue blood sugar control as discussed in office today, low carbohydrate diet, and regular physical exercise as tolerated, 150 minutes per week (30 min each day, 5 days per week, or 50 min 3 days per week). Keep blood sugar logs with fasting goal of 90-130 mg/dl, post prandial (after you eat) less than 180.  For Hypoglycemia: BS <60 and Hyperglycemia BS >400; contact the clinic ASAP. Annual eye exams and foot exams are recommended.   Sweaty feet -     Ambulatory referral to Podiatry  Boils of multiple sites -     mupirocin ointment (BACTROBAN) 2 %; Apply 1 Application topically 2 (two) times daily.  Diabetic polyneuropathy associated with type 2 diabetes mellitus Resolved   Patient has been counseled on age-appropriate routine health concerns for screening and prevention. These are reviewed and up-to-date. Referrals have been placed accordingly. Immunizations are up-to-date or declined.    Subjective:   Chief Complaint  Patient presents with   Hypertension   Hypertension Associated symptoms include malaise/fatigue. Pertinent negatives include no blurred vision, chest pain, headaches, palpitations or shortness of breath.   Amanda Tran 57 y.o. female presents to office today for follow up to HTN  She has a past medical history of Anemia, Anxiety, Diabetes, DVT (03/11/2012), Hyperlipidemia,  Hypertension, and Vitamin D deficiency.      HTN Well controlled today. She endorses adherence taking lisinopril 10 mg daily and norvasc 10 mg daily. Weight is increasing steadily despite taking trulicity 0.75 mg weekly.  BP Readings from Last 3 Encounters:  08/22/22 129/83  07/10/22 (!) 116/91  06/21/22 130/85     She has been experiencing excessive sweating and odor from both feet. Foot exam today was essentially normal.  Lab Results  Component Value Date   HGBA1C 6.7 (H) 05/09/2022     States she had a few boils or hair bumps in her groin area. She does shave her pubic hairs and unsure if this is related.    Wants to discuss why she is always tired. She does not exercise. Not drinking at least 64 oz of water daily and diet is not optimal.    Review of Systems  Constitutional:  Positive for malaise/fatigue. Negative for fever and weight loss.  HENT: Negative.  Negative for nosebleeds.   Eyes: Negative.  Negative for blurred vision, double vision and photophobia.  Respiratory: Negative.  Negative for cough and shortness of breath.   Cardiovascular: Negative.  Negative for chest pain, palpitations and leg swelling.  Gastrointestinal: Negative.  Negative for heartburn, nausea and vomiting.  Musculoskeletal: Negative.  Negative for myalgias.  Skin:        SEE HPI  Neurological: Negative.  Negative for dizziness, focal weakness, seizures and headaches.  Psychiatric/Behavioral: Negative.  Negative for suicidal ideas.     Past Medical History:  Diagnosis Date   Anemia    Anxiety  Diabetes (HCC)    DVT (deep venous thrombosis) (HCC) 03/11/2012   Left popliteal    Hyperlipidemia    Hypertension    Vitamin D deficiency     Past Surgical History:  Procedure Laterality Date   CESAREAN SECTION     1990, 1993.1994, 1996   TUBAL LIGATION      Family History  Problem Relation Age of Onset   Kidney failure Mother    Hypertension Father    Diabetes Father    Diabetes  Sister    Colon cancer Neg Hx    Esophageal cancer Neg Hx    Stomach cancer Neg Hx    Rectal cancer Neg Hx    Breast cancer Neg Hx     Social History Reviewed with no changes to be made today.   Outpatient Medications Prior to Visit  Medication Sig Dispense Refill   albuterol (VENTOLIN HFA) 108 (90 Base) MCG/ACT inhaler Inhale 2 puffs into the lungs every 6 (six) hours as needed for wheezing or shortness of breath. 6.7 g 0   amLODipine (NORVASC) 10 MG tablet Take 1 tablet (10 mg total) by mouth daily. 90 tablet 1   atorvastatin (LIPITOR) 20 MG tablet Take 1 tablet (20 mg total) by mouth daily. 90 tablet 2   Blood Glucose Monitoring Suppl (TRUE METRIX METER) w/Device KIT Use as instructed to heck blood glucose level by fingerstick 3 times per day. 1 kit 0   glimepiride (AMARYL) 4 MG tablet Take 2 tablets (8 mg total) by mouth daily with breakfast. 180 tablet 1   glucose blood (TRUE METRIX BLOOD GLUCOSE TEST) test strip Use as instructed to check blood glucose levels by fingerstick 3 times per day. 100 each 12   lisinopril (ZESTRIL) 10 MG tablet Take 1 tablet (10 mg total) by mouth daily. 90 tablet 1   meloxicam (MOBIC) 7.5 MG tablet Take 1 tablet (7.5 mg total) by mouth daily. 7 tablet 0   metFORMIN (GLUCOPHAGE) 500 MG tablet Take 1 tablet (500 mg total) by mouth daily with breakfast. 90 tablet 1   methocarbamol (ROBAXIN) 500 MG tablet Take 1 tablet (500 mg total) by mouth 2 (two) times daily. 20 tablet 0   ondansetron (ZOFRAN-ODT) 4 MG disintegrating tablet Take 1 tablet (4 mg total) by mouth every 8 (eight) hours as needed for nausea or vomiting. 20 tablet 0   TRUEplus Lancets 28G MISC Use as instructed to check blood glucose level by fingerstick 3 times per day. 100 each 3   Dulaglutide (TRULICITY) 0.75 MG/0.5ML SOPN Inject 0.75 mg into the skin once a week. 2 mL 1   predniSONE (DELTASONE) 20 MG tablet Take 2 tablets (40 mg total) by mouth daily. 10 tablet 0   No facility-administered  medications prior to visit.    No Known Allergies     Objective:    BP 129/83   Pulse (!) 104   Ht 5\' 1"  (1.549 m)   Wt 221 lb 3.2 oz (100.3 kg)   SpO2 98%   BMI 41.80 kg/m  Wt Readings from Last 3 Encounters:  08/22/22 221 lb 3.2 oz (100.3 kg)  07/10/22 220 lb (99.8 kg)  06/21/22 220 lb (99.8 kg)    Physical Exam Vitals and nursing note reviewed.  Constitutional:      Appearance: She is well-developed.  HENT:     Head: Normocephalic and atraumatic.  Cardiovascular:     Rate and Rhythm: Normal rate and regular rhythm.     Heart sounds:  Normal heart sounds. No murmur heard.    No friction rub. No gallop.  Pulmonary:     Effort: Pulmonary effort is normal. No tachypnea or respiratory distress.     Breath sounds: Normal breath sounds. No decreased breath sounds, wheezing, rhonchi or rales.  Chest:     Chest wall: No tenderness.  Abdominal:     General: Bowel sounds are normal.     Palpations: Abdomen is soft.  Musculoskeletal:        General: Normal range of motion.     Cervical back: Normal range of motion.  Feet:     Right foot:     Skin integrity: Skin integrity normal. No ulcer, blister, skin breakdown, erythema, warmth, callus, dry skin or fissure.     Left foot:     Skin integrity: Skin integrity normal. No ulcer, blister, skin breakdown, erythema, warmth, callus, dry skin or fissure.     Comments: Unable to visualize toenails due to acrylic on toenails  Skin:    General: Skin is warm and dry.  Neurological:     Mental Status: She is alert and oriented to person, place, and time.     Coordination: Coordination normal.  Psychiatric:        Behavior: Behavior normal. Behavior is cooperative.        Thought Content: Thought content normal.        Judgment: Judgment normal.          Patient has been counseled extensively about nutrition and exercise as well as the importance of adherence with medications and regular follow-up. The patient was given clear  instructions to go to ER or return to medical center if symptoms don't improve, worsen or new problems develop. The patient verbalized understanding.   Follow-up: Return in about 3 months (around 11/22/2022).   Claiborne Rigg, FNP-BC [facility] [place], [place] [phone]   08/22/2022, 4:51 PM

## 2022-08-23 LAB — HEMOGLOBIN A1C
Est. average glucose Bld gHb Est-mCnc: 192 mg/dL
Hgb A1c MFr Bld: 8.3 % — ABNORMAL HIGH (ref 4.8–5.6)

## 2022-08-23 LAB — THYROID PANEL WITH TSH
Free Thyroxine Index: 2 (ref 1.2–4.9)
T3 Uptake Ratio: 22 % — ABNORMAL LOW (ref 24–39)
T4, Total: 9.2 ug/dL (ref 4.5–12.0)
TSH: 0.795 u[IU]/mL (ref 0.450–4.500)

## 2022-09-09 ENCOUNTER — Other Ambulatory Visit (HOSPITAL_COMMUNITY): Payer: Self-pay

## 2022-09-09 ENCOUNTER — Other Ambulatory Visit: Payer: Self-pay

## 2022-09-09 ENCOUNTER — Encounter: Payer: Self-pay | Admitting: Pharmacist

## 2022-09-10 ENCOUNTER — Ambulatory Visit: Payer: Self-pay | Admitting: Podiatry

## 2022-09-10 ENCOUNTER — Other Ambulatory Visit: Payer: Self-pay

## 2022-09-10 ENCOUNTER — Other Ambulatory Visit (HOSPITAL_COMMUNITY): Payer: Self-pay

## 2022-09-10 DIAGNOSIS — M722 Plantar fascial fibromatosis: Secondary | ICD-10-CM

## 2022-09-18 ENCOUNTER — Other Ambulatory Visit: Payer: Self-pay

## 2022-09-24 ENCOUNTER — Ambulatory Visit: Payer: Self-pay

## 2022-09-24 ENCOUNTER — Encounter: Payer: Self-pay | Admitting: Nurse Practitioner

## 2022-09-24 ENCOUNTER — Other Ambulatory Visit: Payer: Self-pay | Admitting: Nurse Practitioner

## 2022-09-24 DIAGNOSIS — Z1211 Encounter for screening for malignant neoplasm of colon: Secondary | ICD-10-CM

## 2022-09-25 ENCOUNTER — Other Ambulatory Visit: Payer: Self-pay | Admitting: Nurse Practitioner

## 2022-09-25 DIAGNOSIS — Z1211 Encounter for screening for malignant neoplasm of colon: Secondary | ICD-10-CM

## 2022-09-30 ENCOUNTER — Ambulatory Visit: Payer: Self-pay | Attending: Nurse Practitioner

## 2022-09-30 DIAGNOSIS — Z111 Encounter for screening for respiratory tuberculosis: Secondary | ICD-10-CM

## 2022-09-30 NOTE — Progress Notes (Signed)
PPD Placement note Amanda Tran, 57 y.o. female is here today for placement of PPD test Reason for PPD test: work  Pt taken PPD test before: no Verified in allergy area and with patient that they are not allergic to the products PPD is made of (Phenol or Tween). No:  Is patient taking any oral or IV steroid medication now or have they taken it in the last month? no Has the patient ever received the BCG vaccine?: no Has the patient been in recent contact with anyone known or suspected of having active TB disease?: no      Date of exposure (if applicable): N/A      Name of person they were exposed to (if applicable): N/A Patient's Country of origin?: [place] O: Alert and oriented in NAD. P:  PPD placed on 09/30/2022.  Patient advised to return for reading within 48-72 hours.

## 2022-10-02 ENCOUNTER — Ambulatory Visit: Payer: Self-pay | Attending: Nurse Practitioner

## 2022-10-02 DIAGNOSIS — R7611 Nonspecific reaction to tuberculin skin test without active tuberculosis: Secondary | ICD-10-CM

## 2022-10-02 LAB — TB SKIN TEST
Induration: 0 mm
TB Skin Test: NEGATIVE

## 2022-10-02 NOTE — Progress Notes (Signed)
PPD Reading Note  PPD read and results entered in EpicCare.  Result: 0 mm induration.  Interpretation: Neagative  If test not read within 48-72 hours of initial placement, patient advised to repeat in other arm 1-3 weeks after this test.  Allergic reaction: no

## 2022-10-08 ENCOUNTER — Other Ambulatory Visit: Payer: Self-pay

## 2022-10-08 ENCOUNTER — Encounter: Payer: Self-pay | Admitting: Pharmacist

## 2022-10-08 ENCOUNTER — Other Ambulatory Visit (HOSPITAL_COMMUNITY): Payer: Self-pay | Admitting: Emergency Medicine

## 2022-10-08 ENCOUNTER — Other Ambulatory Visit (HOSPITAL_COMMUNITY): Payer: Self-pay

## 2022-10-09 ENCOUNTER — Other Ambulatory Visit (HOSPITAL_COMMUNITY): Payer: Self-pay

## 2022-10-14 ENCOUNTER — Other Ambulatory Visit (HOSPITAL_COMMUNITY): Payer: Self-pay

## 2022-10-14 ENCOUNTER — Other Ambulatory Visit: Payer: Self-pay

## 2022-10-15 ENCOUNTER — Other Ambulatory Visit: Payer: Self-pay

## 2022-10-18 ENCOUNTER — Encounter: Payer: Self-pay | Admitting: Nurse Practitioner

## 2022-10-18 ENCOUNTER — Other Ambulatory Visit: Payer: Self-pay

## 2022-10-18 ENCOUNTER — Other Ambulatory Visit (HOSPITAL_COMMUNITY): Payer: Self-pay

## 2022-10-21 ENCOUNTER — Other Ambulatory Visit: Payer: Self-pay

## 2022-10-21 ENCOUNTER — Other Ambulatory Visit: Payer: Self-pay | Admitting: Nurse Practitioner

## 2022-10-21 ENCOUNTER — Other Ambulatory Visit (HOSPITAL_COMMUNITY): Payer: Self-pay

## 2022-10-21 DIAGNOSIS — E1165 Type 2 diabetes mellitus with hyperglycemia: Secondary | ICD-10-CM

## 2022-10-21 MED ORDER — SEMAGLUTIDE (1 MG/DOSE) 4 MG/3ML ~~LOC~~ SOPN
1.0000 mg | PEN_INJECTOR | SUBCUTANEOUS | 1 refills | Status: DC
Start: 2022-10-21 — End: 2023-01-13
  Filled 2022-10-21 – 2022-11-20 (×2): qty 3, 28d supply, fill #0
  Filled 2022-12-09: qty 3, 28d supply, fill #1
  Filled ????-??-??: fill #1

## 2022-10-21 MED ORDER — TRUE METRIX METER W/DEVICE KIT
PACK | 0 refills | Status: AC
  Filled 2022-10-21: qty 1, 30d supply, fill #0
  Filled 2022-10-21: qty 1, 365d supply, fill #0

## 2022-10-25 ENCOUNTER — Other Ambulatory Visit: Payer: Self-pay

## 2022-11-12 ENCOUNTER — Other Ambulatory Visit: Payer: Self-pay | Admitting: Nurse Practitioner

## 2022-11-12 ENCOUNTER — Other Ambulatory Visit: Payer: Self-pay

## 2022-11-12 DIAGNOSIS — E1165 Type 2 diabetes mellitus with hyperglycemia: Secondary | ICD-10-CM

## 2022-11-13 ENCOUNTER — Other Ambulatory Visit: Payer: Self-pay

## 2022-11-15 ENCOUNTER — Other Ambulatory Visit: Payer: Self-pay

## 2022-11-19 ENCOUNTER — Other Ambulatory Visit: Payer: Self-pay

## 2022-11-20 ENCOUNTER — Other Ambulatory Visit: Payer: Self-pay

## 2022-11-21 ENCOUNTER — Other Ambulatory Visit: Payer: Self-pay

## 2022-11-22 ENCOUNTER — Ambulatory Visit: Payer: Self-pay | Attending: Nurse Practitioner | Admitting: Nurse Practitioner

## 2022-11-22 ENCOUNTER — Ambulatory Visit: Payer: Self-pay | Admitting: *Deleted

## 2022-11-22 ENCOUNTER — Encounter: Payer: Self-pay | Admitting: Nurse Practitioner

## 2022-11-22 ENCOUNTER — Other Ambulatory Visit: Payer: Self-pay

## 2022-11-22 VITALS — BP 135/91 | HR 95 | Ht 61.0 in | Wt 219.0 lb

## 2022-11-22 DIAGNOSIS — U099 Post covid-19 condition, unspecified: Secondary | ICD-10-CM | POA: Insufficient documentation

## 2022-11-22 DIAGNOSIS — F5101 Primary insomnia: Secondary | ICD-10-CM

## 2022-11-22 DIAGNOSIS — R053 Chronic cough: Secondary | ICD-10-CM | POA: Insufficient documentation

## 2022-11-22 DIAGNOSIS — Z7984 Long term (current) use of oral hypoglycemic drugs: Secondary | ICD-10-CM | POA: Insufficient documentation

## 2022-11-22 DIAGNOSIS — M546 Pain in thoracic spine: Secondary | ICD-10-CM | POA: Insufficient documentation

## 2022-11-22 DIAGNOSIS — G8929 Other chronic pain: Secondary | ICD-10-CM | POA: Insufficient documentation

## 2022-11-22 DIAGNOSIS — I10 Essential (primary) hypertension: Secondary | ICD-10-CM | POA: Insufficient documentation

## 2022-11-22 DIAGNOSIS — E782 Mixed hyperlipidemia: Secondary | ICD-10-CM | POA: Insufficient documentation

## 2022-11-22 DIAGNOSIS — G4700 Insomnia, unspecified: Secondary | ICD-10-CM | POA: Insufficient documentation

## 2022-11-22 DIAGNOSIS — Z23 Encounter for immunization: Secondary | ICD-10-CM | POA: Insufficient documentation

## 2022-11-22 DIAGNOSIS — E1165 Type 2 diabetes mellitus with hyperglycemia: Secondary | ICD-10-CM | POA: Insufficient documentation

## 2022-11-22 MED ORDER — METFORMIN HCL 500 MG PO TABS
500.0000 mg | ORAL_TABLET | Freq: Every day | ORAL | 1 refills | Status: DC
  Filled 2022-11-22 (×2): qty 90, 90d supply, fill #0

## 2022-11-22 MED ORDER — LISINOPRIL 10 MG PO TABS
10.0000 mg | ORAL_TABLET | Freq: Every day | ORAL | 1 refills | Status: DC
Start: 2022-11-22 — End: 2023-09-02
  Filled 2022-11-22 – 2022-12-16 (×2): qty 90, 90d supply, fill #0
  Filled 2022-12-16: qty 30, 30d supply, fill #0
  Filled 2023-03-20 (×2): qty 30, 30d supply, fill #1
  Filled 2023-04-16 (×3): qty 30, 30d supply, fill #2
  Filled 2023-06-17: qty 30, 30d supply, fill #0
  Filled 2023-08-05 (×2): qty 30, 30d supply, fill #1

## 2022-11-22 MED ORDER — GLIMEPIRIDE 4 MG PO TABS
8.0000 mg | ORAL_TABLET | Freq: Every day | ORAL | 1 refills | Status: DC
Start: 2022-11-22 — End: 2023-06-02
  Filled 2022-11-22: qty 180, 90d supply, fill #0
  Filled 2022-12-16: qty 60, 30d supply, fill #0
  Filled 2022-12-16: qty 180, 90d supply, fill #0
  Filled 2023-01-16 – 2023-01-17 (×2): qty 60, 30d supply, fill #1
  Filled 2023-02-10: qty 60, 30d supply, fill #2
  Filled 2023-03-20 (×2): qty 60, 30d supply, fill #3
  Filled 2023-04-16 – 2023-05-29 (×2): qty 60, 30d supply, fill #4
  Filled 2023-05-30: qty 60, 30d supply, fill #0
  Filled 2023-05-31: qty 60, 30d supply, fill #1

## 2022-11-22 MED ORDER — METHOCARBAMOL 500 MG PO TABS
500.0000 mg | ORAL_TABLET | Freq: Two times a day (BID) | ORAL | 1 refills | Status: DC
  Filled 2022-11-22 (×2): qty 20, 10d supply, fill #0

## 2022-11-22 MED ORDER — ALBUTEROL SULFATE HFA 108 (90 BASE) MCG/ACT IN AERS
2.0000 | INHALATION_SPRAY | Freq: Four times a day (QID) | RESPIRATORY_TRACT | 0 refills | Status: DC | PRN
Start: 2022-11-22 — End: 2023-09-02
  Filled 2022-11-22 (×2): qty 6.7, 25d supply, fill #0

## 2022-11-22 MED ORDER — AMLODIPINE BESYLATE 10 MG PO TABS
10.0000 mg | ORAL_TABLET | Freq: Every day | ORAL | 1 refills | Status: DC
Start: 2022-11-22 — End: 2023-09-02
  Filled 2022-11-22: qty 90, 90d supply, fill #0
  Filled 2022-12-16: qty 30, 30d supply, fill #0
  Filled 2022-12-16: qty 90, 90d supply, fill #0
  Filled 2023-03-20 (×2): qty 30, 30d supply, fill #1
  Filled 2023-04-16 (×3): qty 30, 30d supply, fill #2
  Filled 2023-06-16: qty 30, 30d supply, fill #3
  Filled 2023-06-17: qty 30, 30d supply, fill #0
  Filled 2023-08-05 (×3): qty 30, 30d supply, fill #1

## 2022-11-22 MED ORDER — TRAZODONE HCL 50 MG PO TABS
25.0000 mg | ORAL_TABLET | Freq: Every evening | ORAL | 3 refills | Status: DC | PRN
Start: 2022-11-22 — End: 2023-05-29
  Filled 2022-11-22 (×2): qty 30, 30d supply, fill #0
  Filled 2022-12-16: qty 30, 30d supply, fill #1
  Filled 2023-02-10: qty 30, 30d supply, fill #2
  Filled 2023-03-20 (×2): qty 30, 30d supply, fill #3

## 2022-11-22 MED ORDER — ATORVASTATIN CALCIUM 20 MG PO TABS
20.0000 mg | ORAL_TABLET | Freq: Every day | ORAL | 2 refills | Status: DC
Start: 2022-11-22 — End: 2023-09-02
  Filled 2022-11-22 (×2): qty 90, 90d supply, fill #0
  Filled 2023-06-17: qty 30, 30d supply, fill #0
  Filled 2023-08-05 (×3): qty 30, 30d supply, fill #1

## 2022-11-22 NOTE — Progress Notes (Unsigned)
Assessment & Plan:  Amanda was seen today for follow-up.  Diagnoses and all orders for this visit:  Primary hypertension Will increase lisinopril if no improvement in BP next visit.  -     amLODipine (NORVASC) 10 MG tablet; Take 1 tablet (10 mg total) by mouth daily. -     lisinopril (ZESTRIL) 10 MG tablet; Take 1 tablet (10 mg total) by mouth daily. -     CMP14+EGFR  Type 2 diabetes mellitus with hyperglycemia, without long-term current use of insulin (HCC) Continue all medications -     Hemoglobin A1c -     glimepiride (AMARYL) 4 MG tablet; Take 2 tablets (8 mg total) by mouth daily with breakfast. -     lisinopril (ZESTRIL) 10 MG tablet; Take 1 tablet (10 mg total) by mouth daily. -     metFORMIN (GLUCOPHAGE) 500 MG tablet; Take 1 tablet (500 mg total) by mouth daily with breakfast. -     CMP14+EGFR  Mixed hyperlipidemia -     atorvastatin (LIPITOR) 20 MG tablet; Take 1 tablet (20 mg total) by mouth daily. INSTRUCTIONS: Work on a low fat, heart healthy diet and participate in regular aerobic exercise program by working out at least 150 minutes per week; 5 days a week-30 minutes per day. Avoid red meat/beef/steak,  fried foods. junk foods, sodas, sugary drinks, unhealthy snacking, alcohol and smoking.  Drink at least 80 oz of water per day and monitor your carbohydrate intake daily.    Post-COVID chronic cough -     albuterol (VENTOLIN HFA) 108 (90 Base) MCG/ACT inhaler; Inhale 2 puffs into the lungs every 6 (six) hours as needed for wheezing or shortness of breath.  Encounter for immunization  Primary insomnia -     traZODone (DESYREL) 50 MG tablet; Take 0.5-1 tablets (25-50 mg total) by mouth at bedtime as needed for sleep.  Flu vaccine need -     Flu vaccine trivalent PF, 6mos and older(Flulaval,Afluria,Fluarix,Fluzone)  Chronic bilateral thoracic back pain -     methocarbamol (ROBAXIN) 500 MG tablet; Take 1 tablet (500 mg total) by mouth 2 (two) times  daily.    Patient has been counseled on age-appropriate routine health concerns for screening and prevention. These are reviewed and up-to-date. Referrals have been placed accordingly. Immunizations are up-to-date or declined.    Subjective:   Chief Complaint  Patient presents with   Follow-up    Not sleeping well at night and questions about ozempic shot   HPI Amanda Tran 57 y.o. female presents to office today for follow up to HTN.  She has a past medical history of Anemia, Anxiety, Diabetes, DVT (03/11/2012), Hyperlipidemia, Hypertension, and Vitamin D deficiency.     HTN Blood pressure is slightly elevated today. Weight is stable.  She endorses adherence taking lisinopril 10 mg daily and norvasc 10 mg daily. Would like to work on dietary modifications. If no improvement will need to increase lisinopril.  BP Readings from Last 3 Encounters:  11/22/22 (!) 135/91  08/22/22 129/83  07/10/22 (!) 116/91    DM 2 Diabetes nearing goal of A1c 6.5 or less. She is currently prescribed metformin 500 mg daily, glimepiride 8 mg daily and ozempic 1 mg weekly. However she has not been administering ozempic correctly. We did discuss the correct way to administer today and she was also able to teach back.  Lab Results  Component Value Date   HGBA1C 7.4 (H) 11/22/2022    Lab Results  Component Value  Date   HGBA1C 8.3 (H) 08/22/2022   LDL not at goal with atorvastatin 20 mg daily.   Lab Results  Component Value Date   LDLCALC 106 (H) 05/09/2022    Primary Insomnia She reports not sleeping well. Was prescribed trazodone in the past which we will refill today.     Review of Systems  Constitutional:  Negative for fever, malaise/fatigue and weight loss.  HENT: Negative.  Negative for nosebleeds.   Eyes: Negative.  Negative for blurred vision, double vision and photophobia.  Respiratory:  Positive for cough (chronic). Negative for shortness of breath.   Cardiovascular:  Negative.  Negative for chest pain, palpitations and leg swelling.  Gastrointestinal: Negative.  Negative for heartburn, nausea and vomiting.  Musculoskeletal:  Positive for back pain and joint pain. Negative for myalgias.  Neurological: Negative.  Negative for dizziness, focal weakness, seizures and headaches.  Psychiatric/Behavioral:  Negative for suicidal ideas.     Past Medical History:  Diagnosis Date   Anemia    Anxiety    Diabetes (HCC)    DVT (deep venous thrombosis) (HCC) 03/11/2012   Left popliteal    Hyperlipidemia    Hypertension    Vitamin D deficiency     Past Surgical History:  Procedure Laterality Date   CESAREAN SECTION     1990, 1993.1994, 1996   TUBAL LIGATION      Family History  Problem Relation Age of Onset   Kidney failure Mother    Hypertension Father    Diabetes Father    Diabetes Sister    Colon cancer Neg Hx    Esophageal cancer Neg Hx    Stomach cancer Neg Hx    Rectal cancer Neg Hx    Breast cancer Neg Hx     Social History Reviewed with no changes to be made today.   Outpatient Medications Prior to Visit  Medication Sig Dispense Refill   Blood Glucose Monitoring Suppl (TRUE METRIX METER) w/Device KIT Use as instructed to heck blood glucose level by fingerstick 3 times per day. 1 kit 0   glucose blood (TRUE METRIX BLOOD GLUCOSE TEST) test strip Use as instructed to check blood glucose levels by fingerstick 3 times per day. 100 each 12   Semaglutide, 1 MG/DOSE, 4 MG/3ML SOPN Inject 1 mg as directed once a week. 3 mL 1   TRUEplus Lancets 28G MISC Use as instructed to check blood glucose level by fingerstick 3 times per day. 100 each 3   albuterol (VENTOLIN HFA) 108 (90 Base) MCG/ACT inhaler Inhale 2 puffs into the lungs every 6 (six) hours as needed for wheezing or shortness of breath. 6.7 g 0   amLODipine (NORVASC) 10 MG tablet Take 1 tablet (10 mg total) by mouth daily. 90 tablet 1   atorvastatin (LIPITOR) 20 MG tablet Take 1 tablet (20 mg  total) by mouth daily. 90 tablet 2   glimepiride (AMARYL) 4 MG tablet Take 2 tablets (8 mg total) by mouth daily with breakfast. 180 tablet 1   mupirocin ointment (BACTROBAN) 2 % Apply 1 Application topically 2 (two) times daily. (Patient not taking: Reported on 11/22/2022) 60 g 10   ondansetron (ZOFRAN-ODT) 4 MG disintegrating tablet Take 1 tablet (4 mg total) by mouth every 8 (eight) hours as needed for nausea or vomiting. (Patient not taking: Reported on 11/22/2022) 20 tablet 0   lisinopril (ZESTRIL) 10 MG tablet Take 1 tablet (10 mg total) by mouth daily. (Patient not taking: Reported on 11/22/2022) 90 tablet 1  meloxicam (MOBIC) 7.5 MG tablet Take 1 tablet (7.5 mg total) by mouth daily. (Patient not taking: Reported on 11/22/2022) 7 tablet 0   metFORMIN (GLUCOPHAGE) 500 MG tablet Take 1 tablet (500 mg total) by mouth daily with breakfast. (Patient not taking: Reported on 11/22/2022) 90 tablet 1   methocarbamol (ROBAXIN) 500 MG tablet Take 1 tablet (500 mg total) by mouth 2 (two) times daily. (Patient not taking: Reported on 11/22/2022) 20 tablet 0   No facility-administered medications prior to visit.    No Known Allergies     Objective:    BP (!) 135/91 (BP Location: Left Arm, Patient Position: Sitting, Cuff Size: Large)   Pulse 95   Ht 5\' 1"  (1.549 m)   Wt 219 lb (99.3 kg)   SpO2 100%   BMI 41.38 kg/m  Wt Readings from Last 3 Encounters:  11/22/22 219 lb (99.3 kg)  08/22/22 221 lb 3.2 oz (100.3 kg)  07/10/22 220 lb (99.8 kg)    Physical Exam       Patient has been counseled extensively about nutrition and exercise as well as the importance of adherence with medications and regular follow-up. The patient was given clear instructions to go to ER or return to medical center if symptoms don't improve, worsen or new problems develop. The patient verbalized understanding.   Follow-up: Return in 14 weeks (on 02/28/2023) for HTN DM .   Claiborne Rigg, FNP-BC [facility] [place], [place] [phone]   11/24/2022, 6:36 PM

## 2022-11-22 NOTE — Telephone Encounter (Signed)
Attempted to return her call but every time I dial [phone] I get a [employer] message that this number is out of service.   I dialed it 3 times verifying I was dialing correctly.  Pt has an appt this afternoon with Bertram Denver, NP at 4:10.    She should be able to get assistance with how to administer the Ozempic during her appt.

## 2022-11-22 NOTE — Telephone Encounter (Signed)
Message from [place] F sent at 11/22/2022 10:37 AM EDT  Summary: How to take medication   Pt is calling in because she was prescribed Ozempic and is having issues with administrating the medication. Pt says she has spilled some of the medication and would like assistance on how to take the medication.          Call History  Contact Date/Time Type Contact Phone/Fax User  11/22/2022 10:36 AM EDT Phone ([address]) [place], [name] (Self) [phone] Rexene Edison) Colon Flattery M

## 2022-11-23 LAB — HEMOGLOBIN A1C
Est. average glucose Bld gHb Est-mCnc: 166 mg/dL
Hgb A1c MFr Bld: 7.4 % — ABNORMAL HIGH (ref 4.8–5.6)

## 2022-11-23 LAB — CMP14+EGFR
ALT: 30 IU/L (ref 0–32)
AST: 27 IU/L (ref 0–40)
Albumin: 4.3 g/dL (ref 3.8–4.9)
Alkaline Phosphatase: 138 IU/L — ABNORMAL HIGH (ref 44–121)
BUN/Creatinine Ratio: 13 (ref 9–23)
BUN: 13 mg/dL (ref 6–24)
Bilirubin Total: <0.2 mg/dL (ref 0.0–1.2)
CO2: 23 mmol/L (ref 20–29)
Calcium: 9.3 mg/dL (ref 8.7–10.2)
Chloride: 102 mmol/L (ref 96–106)
Creatinine, Ser: 1.03 mg/dL — ABNORMAL HIGH (ref 0.57–1.00)
Globulin, Total: 3.1 g/dL (ref 1.5–4.5)
Glucose: 175 mg/dL — ABNORMAL HIGH (ref 70–99)
Potassium: 4.4 mmol/L (ref 3.5–5.2)
Sodium: 141 mmol/L (ref 134–144)
Total Protein: 7.4 g/dL (ref 6.0–8.5)
eGFR: 64 mL/min/{1.73_m2} (ref >59)

## 2022-11-24 ENCOUNTER — Encounter: Payer: Self-pay | Admitting: Nurse Practitioner

## 2022-11-26 ENCOUNTER — Other Ambulatory Visit: Payer: Self-pay

## 2022-12-06 ENCOUNTER — Other Ambulatory Visit: Payer: Self-pay

## 2022-12-06 ENCOUNTER — Other Ambulatory Visit (HOSPITAL_COMMUNITY): Payer: Self-pay

## 2022-12-06 ENCOUNTER — Encounter: Payer: Self-pay | Admitting: Family Medicine

## 2022-12-09 ENCOUNTER — Other Ambulatory Visit: Payer: Self-pay

## 2022-12-16 ENCOUNTER — Other Ambulatory Visit: Payer: Self-pay

## 2022-12-16 ENCOUNTER — Other Ambulatory Visit (HOSPITAL_COMMUNITY): Payer: Self-pay

## 2023-01-13 ENCOUNTER — Other Ambulatory Visit: Payer: Self-pay | Admitting: Nurse Practitioner

## 2023-01-13 ENCOUNTER — Other Ambulatory Visit: Payer: Self-pay

## 2023-01-14 ENCOUNTER — Other Ambulatory Visit: Payer: Self-pay

## 2023-01-14 MED ORDER — OZEMPIC (1 MG/DOSE) 4 MG/3ML ~~LOC~~ SOPN
1.0000 mg | PEN_INJECTOR | SUBCUTANEOUS | 1 refills | Status: DC
  Filled 2023-01-14: qty 3, 28d supply, fill #0
  Filled 2023-02-10: qty 3, 28d supply, fill #1

## 2023-01-14 NOTE — Telephone Encounter (Signed)
Requested Prescriptions  Pending Prescriptions Disp Refills   Semaglutide, 1 MG/DOSE, (OZEMPIC, 1 MG/DOSE,) 4 MG/3ML SOPN 3 mL 1    Sig: Inject 1 mg as directed once a week.     Endocrinology:  Diabetes - GLP-1 Receptor Agonists - semaglutide Failed - 01/13/2023  8:51 AM      Failed - HBA1C in normal range and within 180 days    HbA1c, POC (controlled diabetic range)  Date Value Ref Range Status  09/01/2020 7.2 (A) 0.0 - 7.0 % Final   Hgb A1c MFr Bld  Date Value Ref Range Status  11/22/2022 7.4 (H) 4.8 - 5.6 % Final    Comment:             Prediabetes: 5.7 - 6.4          Diabetes: >6.4          Glycemic control for adults with diabetes: <7.0          Failed - Cr in normal range and within 360 days    Creat  Date Value Ref Range Status  12/21/2014 0.78 0.50 - 1.10 mg/dL Final   Creatinine, Ser  Date Value Ref Range Status  11/22/2022 1.03 (H) 0.57 - 1.00 mg/dL Final         Passed - Valid encounter within last 6 months    Recent Outpatient Visits           1 month ago Primary hypertension   [place] [facility], Shea Stakes, NP   4 months ago Primary hypertension   [place] [facility] [place], Shea Stakes, NP   7 months ago Primary hypertension   [place] [facility] [place], [place] W, NP   8 months ago Type 2 diabetes mellitus with hyperglycemia, without long-term current use of insulin [facility])   [facility], Cornelius Moras, RPH-CPP   1 year ago Primary hypertension    [facility] [place], Shea Stakes, NP       Future Appointments             In 1 month Claiborne Rigg, NP [facility]

## 2023-01-17 ENCOUNTER — Other Ambulatory Visit: Payer: Self-pay

## 2023-01-17 ENCOUNTER — Other Ambulatory Visit (HOSPITAL_COMMUNITY): Payer: Self-pay

## 2023-01-20 ENCOUNTER — Other Ambulatory Visit: Payer: Self-pay

## 2023-01-20 ENCOUNTER — Other Ambulatory Visit (HOSPITAL_COMMUNITY): Payer: Self-pay

## 2023-01-24 ENCOUNTER — Encounter (HOSPITAL_BASED_OUTPATIENT_CLINIC_OR_DEPARTMENT_OTHER): Payer: Self-pay

## 2023-01-24 ENCOUNTER — Other Ambulatory Visit: Payer: Self-pay

## 2023-01-24 ENCOUNTER — Emergency Department (HOSPITAL_BASED_OUTPATIENT_CLINIC_OR_DEPARTMENT_OTHER)
Admission: EM | Admit: 2023-01-24 | Discharge: 2023-01-24 | Disposition: A | Discharge status: Home / Self Care | Payer: Medicaid Other | Attending: Emergency Medicine | Admitting: Emergency Medicine

## 2023-01-24 ENCOUNTER — Other Ambulatory Visit (HOSPITAL_BASED_OUTPATIENT_CLINIC_OR_DEPARTMENT_OTHER): Payer: Self-pay

## 2023-01-24 DIAGNOSIS — I10 Essential (primary) hypertension: Secondary | ICD-10-CM | POA: Insufficient documentation

## 2023-01-24 DIAGNOSIS — D72829 Elevated white blood cell count, unspecified: Secondary | ICD-10-CM | POA: Insufficient documentation

## 2023-01-24 DIAGNOSIS — E119 Type 2 diabetes mellitus without complications: Secondary | ICD-10-CM | POA: Insufficient documentation

## 2023-01-24 DIAGNOSIS — N390 Urinary tract infection, site not specified: Secondary | ICD-10-CM | POA: Insufficient documentation

## 2023-01-24 DIAGNOSIS — Z79899 Other long term (current) drug therapy: Secondary | ICD-10-CM | POA: Insufficient documentation

## 2023-01-24 DIAGNOSIS — Z7984 Long term (current) use of oral hypoglycemic drugs: Secondary | ICD-10-CM | POA: Insufficient documentation

## 2023-01-24 DIAGNOSIS — R300 Dysuria: Secondary | ICD-10-CM

## 2023-01-24 LAB — URINALYSIS, ROUTINE W REFLEX MICROSCOPIC
Bilirubin Urine: NEGATIVE
Glucose, UA: NEGATIVE mg/dL
Ketones, ur: NEGATIVE mg/dL
Nitrite: POSITIVE — AB
Protein, ur: NEGATIVE mg/dL
Specific Gravity, Urine: >=1.03 (ref 1.005–1.030)
pH: 5.5 (ref 5.0–8.0)

## 2023-01-24 LAB — URINALYSIS, MICROSCOPIC (REFLEX)

## 2023-01-24 MED ORDER — CEPHALEXIN 500 MG PO CAPS
500.0000 mg | ORAL_CAPSULE | Freq: Two times a day (BID) | ORAL | 0 refills | Status: AC
  Filled 2023-01-24: qty 14, 7d supply, fill #0

## 2023-01-24 NOTE — Discharge Instructions (Signed)
Your history, exam, and urinalysis indeed revealed urinary tract infection as you suspected.  Please take the antibiotics for the next week to treat this and follow-up with a primary doctor.  If any symptoms change or worsen acutely, return to the nearest emergency department.

## 2023-01-24 NOTE — ED Triage Notes (Signed)
The patient is having a burning when urinating and foul smelling urine for two weeks. No fever.

## 2023-01-24 NOTE — ED Provider Notes (Signed)
[facility] Provider Note   CSN: [id number] Arrival date & time: 01/24/23  0745     History  Chief Complaint  Patient presents with   Dysuria    Amanda Tran is a 57 y.o. female.  The history is provided by the patient and medical records. No language interpreter was used.  Dysuria Pain quality:  Burning Pain severity:  Moderate Onset quality:  Gradual Duration:  2 weeks Timing:  Constant Progression:  Unchanged Chronicity:  Recurrent Recent urinary tract infections: no   Relieved by:  Nothing Worsened by:  Nothing Ineffective treatments:  None tried Urinary symptoms: discolored urine and foul-smelling urine   Associated symptoms: no abdominal pain, no fever, no flank pain, no genital lesions, no nausea, no vaginal discharge and no vomiting        Home Medications Prior to Admission medications   Medication Sig Start Date End Date Taking? Authorizing Provider  albuterol (VENTOLIN HFA) 108 (90 Base) MCG/ACT inhaler Inhale 2 puffs into the lungs every 6 (six) hours as needed for wheezing or shortness of breath. 11/22/22   Claiborne Rigg, NP  amLODipine (NORVASC) 10 MG tablet Take 1 tablet (10 mg total) by mouth daily. 11/22/22   Claiborne Rigg, NP  atorvastatin (LIPITOR) 20 MG tablet Take 1 tablet (20 mg total) by mouth daily. 11/22/22   Claiborne Rigg, NP  Blood Glucose Monitoring Suppl (TRUE METRIX METER) w/Device KIT Use as instructed to heck blood glucose level by fingerstick 3 times per day. 10/21/22   Claiborne Rigg, NP  glimepiride (AMARYL) 4 MG tablet Take 2 tablets (8 mg total) by mouth daily with breakfast. 11/22/22   Claiborne Rigg, NP  glucose blood (TRUE METRIX BLOOD GLUCOSE TEST) test strip Use as instructed to check blood glucose levels by fingerstick 3 times per day. 12/24/21   Claiborne Rigg, NP  lisinopril (ZESTRIL) 10 MG tablet Take 1 tablet (10 mg total) by mouth daily. 11/22/22   Claiborne Rigg, NP  metFORMIN (GLUCOPHAGE) 500 MG tablet Take 1 tablet (500 mg total) by mouth daily with breakfast. 11/22/22   Claiborne Rigg, NP  methocarbamol (ROBAXIN) 500 MG tablet Take 1 tablet (500 mg total) by mouth 2 (two) times daily. 11/22/22   Claiborne Rigg, NP  mupirocin ointment (BACTROBAN) 2 % Apply 1 Application topically 2 (two) times daily. Patient not taking: Reported on 11/22/2022 08/22/22   Claiborne Rigg, NP  ondansetron (ZOFRAN-ODT) 4 MG disintegrating tablet Take 1 tablet (4 mg total) by mouth every 8 (eight) hours as needed for nausea or vomiting. Patient not taking: Reported on 11/22/2022 01/20/22   Alvira Monday, MD  Semaglutide, 1 MG/DOSE, (OZEMPIC, 1 MG/DOSE,) 4 MG/3ML SOPN Inject 1 mg as directed once a week. 01/14/23   Claiborne Rigg, NP  traZODone (DESYREL) 50 MG tablet Take 0.5-1 tablets (25-50 mg total) by mouth at bedtime as needed for sleep. 11/22/22   Claiborne Rigg, NP  TRUEplus Lancets 28G MISC Use as instructed to check blood glucose level by fingerstick 3 times per day. 12/24/21   Claiborne Rigg, NP      Allergies    Patient has no known allergies.    Review of Systems   Review of Systems  Constitutional:  Negative for chills, fatigue and fever.  HENT:  Negative for congestion.   Respiratory:  Negative for chest tightness and shortness of breath.   Cardiovascular:  Negative for  chest pain.  Gastrointestinal:  Negative for abdominal pain, constipation, diarrhea, nausea and vomiting.  Genitourinary:  Positive for dysuria. Negative for flank pain, frequency, genital sores, pelvic pain, urgency and vaginal discharge.  Musculoskeletal:  Negative for back pain.  Skin:  Negative for rash.  Neurological:  Negative for headaches.  Psychiatric/Behavioral:  Negative for agitation.   All other systems reviewed and are negative.   Physical Exam Updated Vital Signs BP (!) 121/94   Pulse 96   Temp 97.9 F (36.6 C) (Oral)   Resp 18   Ht 5\' 1"  (1.549 m)   Wt  97.5 kg   SpO2 100%   BMI 40.62 kg/m  Physical Exam Vitals and nursing note reviewed.  Constitutional:      General: She is not in acute distress.    Appearance: She is well-developed. She is not ill-appearing, toxic-appearing or diaphoretic.  HENT:     Head: Normocephalic and atraumatic.     Nose: No congestion or rhinorrhea.     Mouth/Throat:     Mouth: Mucous membranes are moist.     Pharynx: No oropharyngeal exudate or posterior oropharyngeal erythema.  Eyes:     Extraocular Movements: Extraocular movements intact.     Conjunctiva/sclera: Conjunctivae normal.     Pupils: Pupils are equal, round, and reactive to light.  Cardiovascular:     Rate and Rhythm: Normal rate and regular rhythm.     Heart sounds: No murmur heard. Pulmonary:     Effort: Pulmonary effort is normal. No respiratory distress.     Breath sounds: Normal breath sounds. No wheezing, rhonchi or rales.  Chest:     Chest wall: No tenderness.  Abdominal:     Palpations: Abdomen is soft.     Tenderness: There is no abdominal tenderness. There is no guarding or rebound.  Musculoskeletal:        General: No swelling or tenderness.     Cervical back: Neck supple.  Skin:    General: Skin is warm and dry.     Capillary Refill: Capillary refill takes less than 2 seconds.     Findings: No erythema.  Neurological:     General: No focal deficit present.     Mental Status: She is alert.  Psychiatric:        Mood and Affect: Mood normal.     ED Results / Procedures / Treatments   Labs (all labs ordered are listed, but only abnormal results are displayed) Labs Reviewed  URINALYSIS, ROUTINE W REFLEX MICROSCOPIC - Abnormal; Notable for the following components:      Result Value   APPearance CLOUDY (*)    Hgb urine dipstick TRACE (*)    Nitrite POSITIVE (*)    Leukocytes,Ua SMALL (*)    All other components within normal limits  URINALYSIS, MICROSCOPIC (REFLEX) - Abnormal; Notable for the following components:    Bacteria, UA MANY (*)    All other components within normal limits  URINE CULTURE    EKG None  Radiology No results found.  Procedures Procedures    Medications Ordered in ED Medications - No data to display  ED Course/ Medical Decision Making/ A&P                                 Medical Decision Making Amount and/or Complexity of Data Reviewed Labs: ordered.    [name] Or[place] is a 57 y.o. female with a past  medical history significant for hypertension, previous DVT, diabetes, hyperlipidemia, and previous urinary tract infection who presents for dysuria.  Going to patient, for the last 2 weeks she has had burning dysuria and foul-smelling urine.  She reports this feels similar to UTIs in the past.  Denies any recent trauma and denies any abdominal pain at this time.  Denies any vaginal discharge or vaginal bleeding and is not concerned about pelvic issues otherwise.  No constipation or diarrhea.  No fevers or chills or other systemic signs of infection.  Patient just wants to be evaluated for recurrent UTI.  On exam, lungs clear and chest nontender.  Abdomen nontender.  Extremities nontender.  Patient otherwise well-appearing.  Will get urinalysis and culture.  Anticipate follow-up on urinalysis results but due to otherwise well appearance and lack of other complaints will hold on more extensive workup.  Anticipate discharge either with or without antibiotics based on results.  9:13 AM Urinalysis returned showing patient indeed does have a UTI with nitrites, leukocytes, and many bacteria.  She reports no recent antibiotic use and do not see any resistant bacteria on recent cultures.  Will give prescription for antibiotics and she will follow-up with a primary doctor.  Patient agreed to plan of care and was discharged in good condition.         Final Clinical Impression(s) / ED Diagnoses Final diagnoses:  Lower urinary tract infectious disease  Dysuria    Rx  / DC Orders ED Discharge Orders          Ordered    cephALEXin (KEFLEX) 500 MG capsule  2 times daily        01/24/23 0915            Clinical Impression: 1. Lower urinary tract infectious disease   2. Dysuria     Disposition: Discharge  Condition: Good  I have discussed the results, Dx and Tx plan with the pt(& family if present). He/she/they expressed understanding and agree(s) with the plan. Discharge instructions discussed at great length. Strict return precautions discussed and pt &/or family have verbalized understanding of the instructions. No further questions at time of discharge.    New Prescriptions   CEPHALEXIN (KEFLEX) 500 MG CAPSULE    Take 1 capsule (500 mg total) by mouth 2 (two) times daily for 7 days.    Follow Up: Claiborne Rigg, NP [address] [place] [address] [place] [address] [phone]     [facility] Emergency Department at [facility] [address] [place] [address] [phone]        Chace Bisch, Canary Brim, MD 01/24/23 [phone]

## 2023-01-26 LAB — URINE CULTURE: Culture: >=100000 — AB

## 2023-01-27 ENCOUNTER — Telehealth (HOSPITAL_BASED_OUTPATIENT_CLINIC_OR_DEPARTMENT_OTHER): Payer: Self-pay | Admitting: *Deleted

## 2023-01-27 NOTE — Telephone Encounter (Signed)
Post ED Visit - Positive Culture Follow-up  Culture report reviewed by antimicrobial stewardship pharmacist: [facility] []  Nathan Batchelder, [place].D. []  Celedonio Miyamoto, Pharm.D., BCPS AQ-ID []  Garvin Fila, Pharm.D., BCPS []  Georgina Pillion, Pharm.D., BCPS []  [place], [address].D., BCPS, AAHIVP []  Estella Husk, Pharm.D., BCPS, AAHIVP []  Lysle Pearl, PharmD, BCPS []  Phillips Climes, PharmD, BCPS []  Agapito Games, PharmD, BCPS []  Verlan Friends, PharmD []  Mervyn Gay, PharmD, BCPS [x]  Estill Batten, PharmD  [facility] []  Len Childs, PharmD []  Greer Pickerel, PharmD []  Adalberto Cole, PharmD []  Perlie Gold, Rph []  Lonell Face) Jean Rosenthal, PharmD []  Earl Many, PharmD []  Junita Push, PharmD []  Dorna Leitz, PharmD []  Terrilee Files, PharmD []  Lynann Beaver, PharmD []  Keturah Barre, PharmD []  Loralee Pacas, PharmD []  Bernadene Person, PharmD   Positive urine culture Treated with Cephalexin, organism sensitive to the same and no further patient follow-up is required at this time.  Patsey Berthold 01/27/2023, 10:32 AM

## 2023-02-10 ENCOUNTER — Other Ambulatory Visit: Payer: Self-pay

## 2023-02-12 ENCOUNTER — Other Ambulatory Visit: Payer: Self-pay

## 2023-02-12 ENCOUNTER — Other Ambulatory Visit: Payer: Self-pay | Admitting: Nurse Practitioner

## 2023-02-15 MED ORDER — TRIAMCINOLONE ACETONIDE 0.1 % EX CREA
TOPICAL_CREAM | CUTANEOUS | 0 refills | Status: DC
  Filled 2023-02-15: qty 45, 30d supply, fill #0

## 2023-02-17 ENCOUNTER — Other Ambulatory Visit: Payer: Self-pay

## 2023-02-26 ENCOUNTER — Other Ambulatory Visit: Payer: Self-pay | Admitting: Nurse Practitioner

## 2023-02-26 ENCOUNTER — Telehealth: Payer: Self-pay

## 2023-02-26 ENCOUNTER — Encounter: Payer: Self-pay | Admitting: Pediatrics

## 2023-02-26 ENCOUNTER — Encounter: Payer: Self-pay | Admitting: Internal Medicine

## 2023-02-26 DIAGNOSIS — Z1231 Encounter for screening mammogram for malignant neoplasm of breast: Secondary | ICD-10-CM

## 2023-02-26 NOTE — Telephone Encounter (Signed)
Copied from CRM [id number]. Topic: Referral - Status >> Feb 26, 2023 11:03 AM Macon Large wrote: Reason for CRM: Pt requests the name of location for the eye doctor that she was sent to previously. Cb# [phone]

## 2023-02-27 NOTE — Telephone Encounter (Signed)
Message sent through my chart

## 2023-02-28 ENCOUNTER — Encounter: Payer: Self-pay | Admitting: Nurse Practitioner

## 2023-02-28 ENCOUNTER — Other Ambulatory Visit: Payer: Self-pay

## 2023-02-28 ENCOUNTER — Ambulatory Visit: Payer: Self-pay | Attending: Nurse Practitioner | Admitting: Nurse Practitioner

## 2023-02-28 VITALS — BP 126/86 | HR 80 | Ht 61.0 in | Wt 218.6 lb

## 2023-02-28 DIAGNOSIS — Z79899 Other long term (current) drug therapy: Secondary | ICD-10-CM | POA: Insufficient documentation

## 2023-02-28 DIAGNOSIS — E785 Hyperlipidemia, unspecified: Secondary | ICD-10-CM | POA: Insufficient documentation

## 2023-02-28 DIAGNOSIS — Z1211 Encounter for screening for malignant neoplasm of colon: Secondary | ICD-10-CM

## 2023-02-28 DIAGNOSIS — F419 Anxiety disorder, unspecified: Secondary | ICD-10-CM | POA: Insufficient documentation

## 2023-02-28 DIAGNOSIS — I10 Essential (primary) hypertension: Secondary | ICD-10-CM

## 2023-02-28 DIAGNOSIS — E1165 Type 2 diabetes mellitus with hyperglycemia: Secondary | ICD-10-CM

## 2023-02-28 DIAGNOSIS — Z7985 Long-term (current) use of injectable non-insulin antidiabetic drugs: Secondary | ICD-10-CM | POA: Insufficient documentation

## 2023-02-28 DIAGNOSIS — Z86718 Personal history of other venous thrombosis and embolism: Secondary | ICD-10-CM | POA: Insufficient documentation

## 2023-02-28 DIAGNOSIS — Z7984 Long term (current) use of oral hypoglycemic drugs: Secondary | ICD-10-CM

## 2023-02-28 LAB — POCT GLYCOSYLATED HEMOGLOBIN (HGB A1C): HbA1c, POC (controlled diabetic range): 6.3 % (ref 0.0–7.0)

## 2023-02-28 MED ORDER — OZEMPIC (1 MG/DOSE) 4 MG/3ML ~~LOC~~ SOPN
1.0000 mg | PEN_INJECTOR | SUBCUTANEOUS | 1 refills | Status: DC
  Filled 2023-02-28: qty 3, 28d supply, fill #0

## 2023-02-28 MED ORDER — SEMAGLUTIDE (2 MG/DOSE) 8 MG/3ML ~~LOC~~ SOPN
2.0000 mg | PEN_INJECTOR | SUBCUTANEOUS | 1 refills | Status: DC
  Filled 2023-02-28: qty 3, fill #0
  Filled 2023-03-03 – 2023-04-16 (×4): qty 3, 28d supply, fill #0
  Filled 2023-04-16: qty 3, fill #0
  Filled 2023-04-23: qty 6, 56d supply, fill #0

## 2023-02-28 NOTE — Progress Notes (Unsigned)
Assessment & Plan:  Amanda Tran was seen today for medical management of chronic issues.  Diagnoses and all orders for this visit:  Type 2 diabetes mellitus with hyperglycemia, without long-term current use of insulin (HCC) -     POCT glycosylated hemoglobin (Hb A1C) -     Urine Albumin/Creatinine with ratio (send out) [LAB689] -     Ambulatory referral to Ophthalmology -     Semaglutide, 2 MG/DOSE, 8 MG/3ML SOPN; Inject 2 mg as directed once a week. -     CMP14+EGFR Continue blood sugar control as discussed in office today, low carbohydrate diet, and regular physical exercise as tolerated, 150 minutes per week (30 min each day, 5 days per week, or 50 min 3 days per week). Keep blood sugar logs with fasting goal of 90-130 mg/dl, post prandial (after you eat) less than 180.  For Hypoglycemia: BS <60 and Hyperglycemia BS >400; contact the clinic ASAP. Annual eye exams and foot exams are recommended.   Colon cancer screening -     Ambulatory referral to Gastroenterology  Primary hypertension Continue all antihypertensives as prescribed.  Reminded to bring in blood pressure log for follow  up appointment.  RECOMMENDATIONS: DASH/Mediterranean Diets are healthier choices for HTN.      Patient has been counseled on age-appropriate routine health concerns for screening and prevention. These are reviewed and up-to-date. Referrals have been placed accordingly. Immunizations are up-to-date or declined.    Subjective:   Chief Complaint  Patient presents with   Medical Management of Chronic Issues    Amanda Tran 57 y.o. female presents to office today for follow up to DM and HTN   She has a past medical history of Anemia, Anxiety, Diabetes, DVT(03/11/2012), Hyperlipidemia, Hypertension, and Vitamin D deficiency.    HTN Blood pressure is well controlled. She is taking amlodipine 10 mg daily and lisinopril 10 mg daily as prescribed.  BP Readings from Last 3 Encounters:  02/28/23  126/86  01/24/23 (!) 121/94  11/22/22 (!) 135/91     DM 2 A1c at goal with ozempic 0.5mg  weekly however she does feel her cravings are returning and would like to increase dosage at this time. She is also taking glimepiride 8 mg daily. She has been increasing her activity levels. Walking the dog for cardio.  Lab Results  Component Value Date   HGBA1C 6.3 02/28/2023     Review of Systems  Constitutional:  Negative for fever, malaise/fatigue and weight loss.  HENT: Negative.  Negative for nosebleeds.   Eyes: Negative.  Negative for blurred vision, double vision and photophobia.  Respiratory: Negative.  Negative for cough and shortness of breath.   Cardiovascular: Negative.  Negative for chest pain, palpitations and leg swelling.  Gastrointestinal: Negative.  Negative for heartburn, nausea and vomiting.  Musculoskeletal: Negative.  Negative for myalgias.  Neurological: Negative.  Negative for dizziness, focal weakness, seizures and headaches.  Psychiatric/Behavioral: Negative.  Negative for suicidal ideas.     Past Medical History:  Diagnosis Date   Anemia    Anxiety    Diabetes (HCC)    DVT (deep venous thrombosis) (HCC) 03/11/2012   Left popliteal    Hyperlipidemia    Hypertension    Vitamin D deficiency     Past Surgical History:  Procedure Laterality Date   CESAREAN SECTION     1990, 1993.1994, 1996   TUBAL LIGATION      Family History  Problem Relation Age of Onset   Kidney failure Mother  Hypertension Father    Diabetes Father    Diabetes Sister    Colon cancer Neg Hx    Esophageal cancer Neg Hx    Stomach cancer Neg Hx    Rectal cancer Neg Hx    Breast cancer Neg Hx     Social History Reviewed with no changes to be made today.   Outpatient Medications Prior to Visit  Medication Sig Dispense Refill   albuterol (VENTOLIN HFA) 108 (90 Base) MCG/ACT inhaler Inhale 2 puffs into the lungs every 6 (six) hours as needed for wheezing or shortness of breath.  6.7 g 0   amLODipine (NORVASC) 10 MG tablet Take 1 tablet (10 mg total) by mouth daily. 90 tablet 1   atorvastatin (LIPITOR) 20 MG tablet Take 1 tablet (20 mg total) by mouth daily. 90 tablet 2   Blood Glucose Monitoring Suppl (TRUE METRIX METER) w/Device KIT Use as instructed to heck blood glucose level by fingerstick 3 times per day. 1 kit 0   glimepiride (AMARYL) 4 MG tablet Take 2 tablets (8 mg total) by mouth daily with breakfast. 180 tablet 1   glucose blood (TRUE METRIX BLOOD GLUCOSE TEST) test strip Use as instructed to check blood glucose levels by fingerstick 3 times per day. 100 each 12   lisinopril (ZESTRIL) 10 MG tablet Take 1 tablet (10 mg total) by mouth daily. 90 tablet 1   mupirocin ointment (BACTROBAN) 2 % Apply 1 Application topically 2 (two) times daily. 60 g 10   traZODone (DESYREL) 50 MG tablet Take 0.5-1 tablets (25-50 mg total) by mouth at bedtime as needed for sleep. 30 tablet 3   triamcinolone cream (KENALOG) 0.1 % APPLY TOPICALLY DAILY UNTIL RESOLVED 45 g 0   TRUEplus Lancets 28G MISC Use as instructed to check blood glucose level by fingerstick 3 times per day. 100 each 3   Semaglutide, 1 MG/DOSE, (OZEMPIC, 1 MG/DOSE,) 4 MG/3ML SOPN Inject 1 mg as directed once a week. 3 mL 1   metFORMIN (GLUCOPHAGE) 500 MG tablet Take 1 tablet (500 mg total) by mouth daily with breakfast. (Patient not taking: Reported on 02/28/2023) 90 tablet 1   methocarbamol (ROBAXIN) 500 MG tablet Take 1 tablet (500 mg total) by mouth 2 (two) times daily. (Patient not taking: Reported on 02/28/2023) 20 tablet 1   ondansetron (ZOFRAN-ODT) 4 MG disintegrating tablet Take 1 tablet (4 mg total) by mouth every 8 (eight) hours as needed for nausea or vomiting. (Patient not taking: Reported on 11/22/2022) 20 tablet 0   No facility-administered medications prior to visit.    No Known Allergies     Objective:    BP 126/86 (BP Location: Left Arm, Patient Position: Sitting, Cuff Size: Normal)   Pulse 80    Ht 5\' 1"  (1.549 m)   Wt 218 lb 9.6 oz (99.2 kg)   SpO2 98%   BMI 41.30 kg/m  Wt Readings from Last 3 Encounters:  02/28/23 218 lb 9.6 oz (99.2 kg)  01/24/23 215 lb (97.5 kg)  11/22/22 219 lb (99.3 kg)    Physical Exam Vitals and nursing note reviewed.  Constitutional:      Appearance: She is well-developed.  HENT:     Head: Normocephalic and atraumatic.  Cardiovascular:     Rate and Rhythm: Normal rate and regular rhythm.     Heart sounds: Normal heart sounds. No murmur heard.    No friction rub. No gallop.  Pulmonary:     Effort: Pulmonary effort is normal. No tachypnea  or respiratory distress.     Breath sounds: Normal breath sounds. No decreased breath sounds, wheezing, rhonchi or rales.  Chest:     Chest wall: No tenderness.  Abdominal:     General: Bowel sounds are normal.     Palpations: Abdomen is soft.  Musculoskeletal:        General: Normal range of motion.     Cervical back: Normal range of motion.  Skin:    General: Skin is warm and dry.  Neurological:     Mental Status: She is alert and oriented to person, place, and time.     Coordination: Coordination normal.  Psychiatric:        Behavior: Behavior normal. Behavior is cooperative.        Thought Content: Thought content normal.        Judgment: Judgment normal.          Patient has been counseled extensively about nutrition and exercise as well as the importance of adherence with medications and regular follow-up. The patient was given clear instructions to go to ER or return to medical center if symptoms don't improve, worsen or new problems develop. The patient verbalized understanding.   Follow-up: Return in about 3 months (around 05/29/2023) for PAP SMEAR.   Claiborne Rigg, FNP-BC [facility] [place], [place] [phone]   03/05/2023, 10:49 PM

## 2023-03-03 ENCOUNTER — Other Ambulatory Visit: Payer: Self-pay

## 2023-03-03 ENCOUNTER — Other Ambulatory Visit: Payer: Self-pay | Admitting: Pharmacist

## 2023-03-03 MED ORDER — SEMAGLUTIDE (1 MG/DOSE) 4 MG/3ML ~~LOC~~ SOPN
1.0000 mg | PEN_INJECTOR | SUBCUTANEOUS | 0 refills | Status: DC
Start: 2023-03-03 — End: 2023-03-21
  Filled 2023-03-03: qty 3, 28d supply, fill #0

## 2023-03-07 ENCOUNTER — Other Ambulatory Visit: Payer: Self-pay

## 2023-03-20 ENCOUNTER — Other Ambulatory Visit: Payer: Self-pay

## 2023-03-20 ENCOUNTER — Other Ambulatory Visit: Payer: Self-pay | Admitting: Nurse Practitioner

## 2023-03-20 MED ORDER — TRIAMCINOLONE ACETONIDE 0.1 % EX CREA
1.0000 | TOPICAL_CREAM | Freq: Every day | CUTANEOUS | 0 refills | Status: DC
  Filled 2023-03-20: qty 45, 45d supply, fill #0

## 2023-03-21 ENCOUNTER — Other Ambulatory Visit (HOSPITAL_COMMUNITY): Payer: Self-pay

## 2023-03-21 ENCOUNTER — Other Ambulatory Visit: Payer: Self-pay

## 2023-03-21 ENCOUNTER — Other Ambulatory Visit: Payer: Self-pay | Admitting: Family Medicine

## 2023-03-21 MED ORDER — OZEMPIC (1 MG/DOSE) 4 MG/3ML ~~LOC~~ SOPN
1.0000 mg | PEN_INJECTOR | SUBCUTANEOUS | 1 refills | Status: DC
  Filled 2023-03-21: qty 3, 28d supply, fill #0
  Filled 2023-04-16: qty 3, 28d supply, fill #1

## 2023-03-24 ENCOUNTER — Other Ambulatory Visit: Payer: Self-pay

## 2023-03-24 ENCOUNTER — Encounter (INDEPENDENT_AMBULATORY_CARE_PROVIDER_SITE_OTHER): Payer: Medicaid Other | Admitting: Physician Assistant

## 2023-03-27 ENCOUNTER — Ambulatory Visit: Payer: Medicaid Other

## 2023-04-03 ENCOUNTER — Other Ambulatory Visit: Payer: Self-pay

## 2023-04-08 ENCOUNTER — Other Ambulatory Visit: Payer: Self-pay

## 2023-04-08 ENCOUNTER — Ambulatory Visit (AMBULATORY_SURGERY_CENTER): Payer: Self-pay | Admitting: *Deleted

## 2023-04-08 ENCOUNTER — Telehealth: Payer: Self-pay | Admitting: Nurse Practitioner

## 2023-04-08 VITALS — Ht 61.0 in | Wt 213.0 lb

## 2023-04-08 DIAGNOSIS — Z860100 Personal history of colon polyps, unspecified: Secondary | ICD-10-CM

## 2023-04-08 MED ORDER — PLENVU 140 G PO SOLR
1.0000 | ORAL | 0 refills | Status: DC
  Filled 2023-04-08: qty 1, fill #0
  Filled 2023-04-16: qty 1, 1d supply, fill #0
  Filled 2023-04-16: qty 3, 1d supply, fill #0

## 2023-04-08 NOTE — Progress Notes (Signed)
 Pt's name and DOB verified at the beginning of the pre-visit wit 2 identifiers  Pt denies any difficulty with ambulating,sitting, laying down or rolling side to side  Pt has no issues with ambulation   Pt has no issues moving head neck or swallowing  No egg or soy allergy known to patient   No issues known to pt with past sedation with any surgeries or procedures  Pt denies having issues being intubated  No FH of Malignant Hyperthermia  Pt is not on diet pills or shots  Pt is not on home 02   Pt is not on blood thinners   Pt denies issues with constipation   Pt is not on dialysis  Pt denise any abnormal heart rhythms   Pt denies any upcoming cardiac testing  Pt encouraged to use to use Singlecare or Goodrx to reduce cost   Patient's chart reviewed by Norleen Schillings CNRA prior to pre-visit and patient appropriate for the [facility].  Pre-visit completed and red dot placed by patient's name on their procedure day (on provider's schedule).  .  Visit by phone  Pt states weight is 213 lb  Instructed pt why it is important to and  to call if they have any changes in health or new medications. Directed them to the # given and on instructions.     Instructions reviewed. Pt given both [facility] main # and MD on call # prior to instructions.  Pt states understanding. Instructed to review again prior to procedure. Pt states they will.   Instructions sent by mail with coupon and by My Chart  Coupon sent via text to mobile phone and pt verified they received it

## 2023-04-08 NOTE — Telephone Encounter (Signed)
 Copied from CRM [id number]. Topic: General - Other >> Apr 08, 2023  8:34 AM Marlow Baars wrote: Reason for CRM: Tanya with [employer] is re faxing over a form for the provider to look over and sign. Please assist further

## 2023-04-09 ENCOUNTER — Ambulatory Visit: Payer: Medicaid Other

## 2023-04-10 NOTE — Telephone Encounter (Signed)
No forms and been received.

## 2023-04-16 ENCOUNTER — Other Ambulatory Visit (HOSPITAL_COMMUNITY): Payer: Self-pay

## 2023-04-16 ENCOUNTER — Other Ambulatory Visit: Payer: Self-pay

## 2023-04-16 ENCOUNTER — Telehealth: Payer: Self-pay | Admitting: Internal Medicine

## 2023-04-16 NOTE — Telephone Encounter (Signed)
Pharmacy rep called regarding the patients prep medication stated she had requested a change.

## 2023-04-16 NOTE — Telephone Encounter (Signed)
Spoke with Pharmacy rep and Plenvu is too expensive for patient. Rep suggested integent program at [facility] pharmacy to help with cost of prep.  Will call patient and offer extra prep that plenvu Rep left in PV.

## 2023-04-16 NOTE — Telephone Encounter (Signed)
Called patient to offer prep in pre visit, but was unable to leave message.  Mail box is full.

## 2023-04-17 ENCOUNTER — Encounter: Payer: Self-pay | Admitting: *Deleted

## 2023-04-17 ENCOUNTER — Other Ambulatory Visit: Payer: Self-pay | Admitting: *Deleted

## 2023-04-17 ENCOUNTER — Other Ambulatory Visit: Payer: Self-pay

## 2023-04-17 DIAGNOSIS — Z860100 Personal history of colon polyps, unspecified: Secondary | ICD-10-CM

## 2023-04-17 MED ORDER — NA SULFATE-K SULFATE-MG SULF 17.5-3.13-1.6 GM/177ML PO SOLN
1.0000 | Freq: Once | ORAL | 0 refills | Status: AC
  Filled 2023-04-17: qty 354, 1d supply, fill #0

## 2023-04-21 ENCOUNTER — Encounter: Payer: Self-pay | Admitting: Internal Medicine

## 2023-04-22 ENCOUNTER — Ambulatory Visit
Admission: RE | Admit: 2023-04-22 | Discharge: 2023-04-22 | Disposition: A | Discharge status: Home / Self Care | Payer: BC Managed Care – PPO | Source: Ambulatory Visit | Attending: Nurse Practitioner | Admitting: Nurse Practitioner

## 2023-04-22 DIAGNOSIS — Z1231 Encounter for screening mammogram for malignant neoplasm of breast: Secondary | ICD-10-CM

## 2023-04-22 NOTE — Telephone Encounter (Signed)
Pt states she has the Plenvu and paid only 10.00

## 2023-04-22 NOTE — Telephone Encounter (Signed)
Patient f/u on previous note. Please advise.   Thank you

## 2023-04-23 ENCOUNTER — Encounter (INDEPENDENT_AMBULATORY_CARE_PROVIDER_SITE_OTHER): Payer: Self-pay | Admitting: Internal Medicine

## 2023-04-23 ENCOUNTER — Ambulatory Visit (INDEPENDENT_AMBULATORY_CARE_PROVIDER_SITE_OTHER): Payer: BC Managed Care – PPO | Admitting: Internal Medicine

## 2023-04-23 ENCOUNTER — Other Ambulatory Visit: Payer: Self-pay

## 2023-04-23 VITALS — BP 120/84 | HR 102 | Temp 98.0°F | Ht 62.0 in | Wt 209.0 lb

## 2023-04-23 DIAGNOSIS — I10 Essential (primary) hypertension: Secondary | ICD-10-CM

## 2023-04-23 DIAGNOSIS — Z6838 Body mass index (BMI) 38.0-38.9, adult: Secondary | ICD-10-CM | POA: Diagnosis not present

## 2023-04-23 DIAGNOSIS — E119 Type 2 diabetes mellitus without complications: Secondary | ICD-10-CM

## 2023-04-23 DIAGNOSIS — Z7985 Long-term (current) use of injectable non-insulin antidiabetic drugs: Secondary | ICD-10-CM

## 2023-04-23 DIAGNOSIS — E66812 Obesity, class 2: Secondary | ICD-10-CM | POA: Diagnosis not present

## 2023-04-23 DIAGNOSIS — Z7984 Long term (current) use of oral hypoglycemic drugs: Secondary | ICD-10-CM

## 2023-04-23 DIAGNOSIS — E1169 Type 2 diabetes mellitus with other specified complication: Secondary | ICD-10-CM

## 2023-04-23 HISTORY — DX: Type 2 diabetes mellitus without complications: E11.9

## 2023-04-23 NOTE — Progress Notes (Signed)
Office: [phone]  /  Fax: [phone]   Initial Visit  Amanda Tran was seen in clinic today to evaluate for obesity. She is interested in losing weight to improve overall health and reduce the risk of weight related complications. She presents today to review program treatment options, initial physical assessment, and evaluation.     She was referred by: PCP  When asked what else they would like to accomplish? She states: Improve energy levels and physical activity, Improve existing medical conditions, Improve quality of life, Improve appearance, and Improve self-confidence  Weight history: started to gain weight in her 52  When asked how has your weight affected you? 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, Consumption of processed foods, and Use of obesogenic medications: Obesogenic diabetes medications  Weight promoting medications identified: Obesogenic diabetes medications  Current nutrition plan: None  Current level of physical activity: NEAT  Current or previous pharmacotherapy: GLP-1 and Other: on Ozempic since 6 months, no weight loss on 1 mg  Response to medication:  no weight loss   Past medical history includes:   Past Medical History:  Diagnosis Date   Anemia    Anxiety    Diabetes (HCC)    Diabetes mellitus (HCC) 04/23/2023   DVT (deep venous thrombosis) (HCC) 03/11/2012   Left popliteal    Hyperlipidemia    Hypertension    Vitamin D deficiency      Objective:   BP 120/84   Pulse (!) 102   Temp 98 F (36.7 C)   Ht 5\' 2"  (1.575 m)   Wt 209 lb (94.8 kg)   SpO2 97%   BMI 38.23 kg/m  She was weighed on the bioimpedance scale: Body mass index is 38.23 kg/m.  Peak Weight:220 , Body  Fat%:45, Visceral Fat Rating:13, Weight trend over the last 12 months: Unchanged  General:  Alert, oriented and cooperative. Patient is in no acute distress.  Respiratory: Normal respiratory effort, no problems with respiration noted   Gait: able to ambulate independently  Mental Status: Normal mood and affect. Normal behavior. Normal judgment and thought content.   DIAGNOSTIC DATA REVIEWED:  BMET    Component Value Date/Time   NA 141 11/22/2022 1653   K 4.4 11/22/2022 1653   CL 102 11/22/2022 1653   CO2 23 11/22/2022 1653   GLUCOSE 175 (H) 11/22/2022 1653   GLUCOSE 155 (H) 07/10/2022 0944   BUN 13 11/22/2022 1653   CREATININE 1.03 (H) 11/22/2022 1653   CREATININE 0.78 12/21/2014 1026   CALCIUM 9.3 11/22/2022 1653   GFRNONAA >60 07/10/2022 0944   GFRNONAA >89 12/21/2014 1026   GFRAA 84 02/23/2020 1118   GFRAA >89 12/21/2014 1026   Lab Results  Component Value Date   HGBA1C 6.3 02/28/2023   HGBA1C 5.2 04/30/2012   No results found for: "INSULIN" CBC    Component Value Date/Time   WBC 5.9 07/10/2022 0944   RBC 5.14 (H) 07/10/2022 0944   HGB 13.2 07/10/2022 0944   HGB 14.4 04/27/2021 1004   HCT 42.2 07/10/2022 0944   HCT 44.0 04/27/2021 1004   PLT 286 07/10/2022 0944   PLT 293 04/27/2021 1004   MCV 82.1 07/10/2022 0944   MCV 80 04/27/2021 1004   MCH 25.7 (L) 07/10/2022 0944   MCHC 31.3 07/10/2022 0944   RDW  14.9 07/10/2022 0944   RDW 14.9 04/27/2021 1004   Iron/TIBC/Ferritin/ %Sat    Component Value Date/Time   IRON 29 02/26/2017 1446   TIBC 379 02/26/2017 1446   FERRITIN 8 (L) 02/26/2017 1446   IRONPCTSAT 8 (LL) 02/26/2017 1446   Lipid Panel     Component Value Date/Time   CHOL 167 05/09/2022 1608   TRIG 122 05/09/2022 1608   HDL 39 (L) 05/09/2022 1608   CHOLHDL 4.3 05/09/2022 1608   CHOLHDL 4.9 03/23/2014 0952   VLDL 11 03/23/2014 0952   LDLCALC 106 (H) 05/09/2022 1608   Hepatic Function Panel     Component Value Date/Time   PROT 7.4 11/22/2022  1653   ALBUMIN 4.3 11/22/2022 1653   AST 27 11/22/2022 1653   ALT 30 11/22/2022 1653   ALKPHOS 138 (H) 11/22/2022 1653   BILITOT <0.2 11/22/2022 1653   BILIDIR <0.1 11/15/2021 1918   BILIDIR 0.08 05/08/2017 1656   IBILI NOT CALCULATED 11/15/2021 1918      Component Value Date/Time   TSH 0.795 08/22/2022 1708     Assessment and Plan:   Primary hypertension Assessment & Plan: Blood pressure is well-controlled on lisinopril and amlodipine.  Losing 10% of body weight may improve blood pressure control.  Continue current regimen we will check renal parameters with intake labs.   Type 2 diabetes mellitus with other specified complication, without long-term current use of insulin (HCC) Assessment & Plan: Her most recent A1c has improved and is at 6.3 she is currently on semaglutide 1 mg once a week and has not started 2 mg dose yet.  She is also on glimepiride this medication with GLP-1 may increase the risk of hypoglycemia and may also cause weight gain through hyperinsulinism.  She was educated on the carb insulin model of obesity and is aware of the importance of reducing simple and added sugars in her diet.  She would likely benefit from a high-protein low-carb reduced calorie nutrition plan.  This will be implemented at her intake appointment.  I recommend consideration of switching from glimepiride to a weight neutral medication like metformin, or SGLT2 medication.      Class 2 severe obesity with serious comorbidity and body mass index (BMI) of 38.0 to 38.9 in adult, unspecified obesity type [facility]) Assessment & Plan: On interview today her weight loss problems are multifactorial likely due to menopause, eating pattern with chronic skipping of meals, diet containing simple and added sugars, she is also on obesogenic hypoglycemic.  We reviewed anthropometrics, biometrics, associated medical conditions and contributing factors with patient. she would benefit from a medically tailored  reduced calorie nutrional plan based on her REE (resting energy expenditure), which will be determined by indirect calorimetry.  We will also assess for cardiometabolic risk and nutritional derangements via fasting labs at intake appointment.           Obesity Treatment / Action Plan:  Patient will work on garnering support from family and friends to begin weight loss journey. Will work on eliminating or reducing the presence of highly palatable, calorie dense foods in the home. Will complete provided nutritional and psychosocial assessment questionnaire before the next appointment. Will be scheduled for indirect calorimetry to determine resting energy expenditure in a fasting state.  This will allow [place] to create a reduced calorie, high-protein meal plan to promote loss of fat mass while preserving muscle mass. Counseled on the health benefits of losing 5%-15% of total body weight. Was counseled on nutritional approaches to  weight loss and benefits of reducing processed foods and consuming plant-based foods and high quality protein as part of nutritional weight management. Was counseled on pharmacotherapy and role as an adjunct in weight management.   Obesity Education Performed Today:  She was weighed on the bioimpedance scale and results were discussed and documented in the synopsis.  We discussed obesity as a disease and the importance of a more detailed evaluation of all the factors contributing to the disease.  We discussed the importance of long term lifestyle changes which include nutrition, exercise and behavioral modifications as well as the importance of customizing this to her specific health and social needs.  We discussed the benefits of reaching a healthier weight to alleviate the symptoms of existing conditions and reduce the risks of the biomechanical, metabolic and psychological effects of obesity.  [name] appears to be in the action stage of change and  states they are ready to start intensive lifestyle modifications and behavioral modifications.  30 minutes was spent today on this visit including the above counseling, pre-visit chart review, and post-visit documentation.  Reviewed by clinician on day of visit: allergies, medications, problem list, medical history, surgical history, family history, social history, and previous encounter notes pertinent to obesity diagnosis.   Worthy Rancher, MD

## 2023-04-23 NOTE — Assessment & Plan Note (Signed)
Blood pressure is well-controlled on lisinopril and amlodipine.  Losing 10% of body weight may improve blood pressure control.  Continue current regimen we will check renal parameters with intake labs.

## 2023-04-23 NOTE — Assessment & Plan Note (Signed)
On interview today her weight loss problems are multifactorial likely due to menopause, eating pattern with chronic skipping of meals, diet containing simple and added sugars, she is also on obesogenic hypoglycemic.  We reviewed anthropometrics, biometrics, associated medical conditions and contributing factors with patient. she would benefit from a medically tailored reduced calorie nutrional plan based on her REE (resting energy expenditure), which will be determined by indirect calorimetry.  We will also assess for cardiometabolic risk and nutritional derangements via fasting labs at intake appointment.

## 2023-04-23 NOTE — Assessment & Plan Note (Signed)
Her most recent A1c has improved and is at 6.3 she is currently on semaglutide 1 mg once a week and has not started 2 mg dose yet.  She is also on glimepiride this medication with GLP-1 may increase the risk of hypoglycemia and may also cause weight gain through hyperinsulinism.  She was educated on the carb insulin model of obesity and is aware of the importance of reducing simple and added sugars in her diet.  She would likely benefit from a high-protein low-carb reduced calorie nutrition plan.  This will be implemented at her intake appointment.  I recommend consideration of switching from glimepiride to a weight neutral medication like metformin, or SGLT2 medication.

## 2023-04-24 ENCOUNTER — Encounter: Payer: Self-pay | Admitting: Nurse Practitioner

## 2023-04-24 ENCOUNTER — Other Ambulatory Visit: Payer: Self-pay

## 2023-04-24 ENCOUNTER — Encounter: Payer: Self-pay | Admitting: Internal Medicine

## 2023-04-24 ENCOUNTER — Ambulatory Visit (AMBULATORY_SURGERY_CENTER): Payer: BC Managed Care – PPO | Admitting: Internal Medicine

## 2023-04-24 VITALS — BP 105/72 | HR 98 | Temp 97.6°F | Resp 14 | Ht 61.0 in | Wt 213.0 lb

## 2023-04-24 DIAGNOSIS — Z1211 Encounter for screening for malignant neoplasm of colon: Secondary | ICD-10-CM

## 2023-04-24 DIAGNOSIS — Z860101 Personal history of adenomatous and serrated colon polyps: Secondary | ICD-10-CM

## 2023-04-24 DIAGNOSIS — Z860100 Personal history of colon polyps, unspecified: Secondary | ICD-10-CM

## 2023-04-24 MED ORDER — SODIUM CHLORIDE 0.9 % IV SOLN: 500.0000 mL | Freq: Once | INTRAVENOUS | Status: DC

## 2023-04-24 NOTE — Op Note (Signed)
[facility] Patient Name: Amanda Tran Procedure Date: 04/24/2023 11:11 AM MRN: [id number] Endoscopist: Wilhemina Bonito. Marina Goodell , MD, [id number] Age: 58 Referring MD:  Date of Birth: 04/25/1965 Gender: Female Account #: [id number] Procedure:                Colonoscopy Indications:              High risk colon cancer surveillance: Personal                            history of multiple (3 or more) adenomas. Previous                            examination 2021 (Dr. Russella Dar) Medicines:                Monitored Anesthesia Care Procedure:                Pre-Anesthesia Assessment:                           - Prior to the procedure, a History and Physical                            was performed, and patient medications and                            allergies were reviewed. The patient's tolerance of                            previous anesthesia was also reviewed. The risks                            and benefits of the procedure and the sedation                            options and risks were discussed with the patient.                            All questions were answered, and informed consent                            was obtained. Prior Anticoagulants: The patient has                            taken no anticoagulant or antiplatelet agents.                            After reviewing the risks and benefits, the patient                            was deemed in satisfactory condition to undergo the                            procedure.  After obtaining informed consent, the colonoscope                            was passed under direct vision. Throughout the                            procedure, the patient's blood pressure, pulse, and                            oxygen saturations were monitored continuously. The                            Olympus Scope [id number] was introduced through the                            anus and advanced to the the cecum,  identified by                            appendiceal orifice and ileocecal valve. The                            ileocecal valve, appendiceal orifice, and rectum                            were photographed. The quality of the bowel                            preparation was good. The colonoscopy was performed                            without difficulty. The patient tolerated the                            procedure well. The bowel preparation used was                            SUPREP via split dose instruction. Scope In: 11:25:17 AM Scope Out: 11:35:07 AM Scope Withdrawal Time: 0 hours 6 minutes 20 seconds  Total Procedure Duration: 0 hours 9 minutes 50 seconds  Findings:                 The entire examined colon appeared normal on direct                            and retroflexion views. Complications:            No immediate complications. Estimated blood loss:                            None. Estimated Blood Loss:     Estimated blood loss: none. Impression:               - The entire examined colon is normal on direct and  retroflexion views.                           - No specimens collected. Recommendation:           - Repeat colonoscopy in 5 years for surveillance                            (history of multiple adenomatous polyps).                           - Patient has a contact number available for                            emergencies. The signs and symptoms of potential                            delayed complications were discussed with the                            patient. Return to normal activities tomorrow.                            Written discharge instructions were provided to the                            patient.                           - Resume previous diet.                           - Continue present medications. Wilhemina Bonito. Marina Goodell, MD 04/24/2023 11:38:22 AM This report has been signed electronically.

## 2023-04-24 NOTE — Progress Notes (Signed)
Sedate, gd SR, tolerated procedure well, VSS, report to RN

## 2023-04-24 NOTE — Patient Instructions (Signed)
Resume previous diet  Continue present medications  REPEAT COLONOSCOPY IN 5 YEARS FOR SURVEILLANCE   YOU HAD AN ENDOSCOPIC PROCEDURE TODAY AT THE [facility]:   Refer to the procedure report that was given to you for any specific questions about what was found during the examination.  If the procedure report does not answer your questions, please call your gastroenterologist to clarify.  If you requested that your care partner not be given the details of your procedure findings, then the procedure report has been included in a sealed envelope for you to review at your convenience later.  YOU SHOULD EXPECT: Some feelings of bloating in the abdomen. Passage of more gas than usual.  Walking can help get rid of the air that was put into your GI tract during the procedure and reduce the bloating. If you had a lower endoscopy (such as a colonoscopy or flexible sigmoidoscopy) you may notice spotting of blood in your stool or on the toilet paper. If you underwent a bowel prep for your procedure, you may not have a normal bowel movement for a few days.  Please Note:  You might notice some irritation and congestion in your nose or some drainage.  This is from the oxygen used during your procedure.  There is no need for concern and it should clear up in a day or so.  SYMPTOMS TO REPORT IMMEDIATELY:  Following lower endoscopy (colonoscopy or flexible sigmoidoscopy):  Excessive amounts of blood in the stool  Significant tenderness or worsening of abdominal pains  Swelling of the abdomen that is new, acute  Fever of 100F or higher  For urgent or emergent issues, a gastroenterologist can be reached at any hour by calling [phone]. Do not use MyChart messaging for urgent concerns.    DIET:  We do recommend a small meal at first, but then you may proceed to your regular diet.  Drink plenty of fluids but you should avoid alcoholic beverages for 24 hours.  ACTIVITY:  You should plan to  take it easy for the rest of today and you should NOT DRIVE or use heavy machinery until tomorrow (because of the sedation medicines used during the test).    FOLLOW UP: Our staff will call the number listed on your records the next business day following your procedure.  We will call around 7:15- 8:00 am to check on you and address any questions or concerns that you may have regarding the information given to you following your procedure. If we do not reach you, we will leave a message.     If any biopsies were taken you will be contacted by phone or by letter within the next 1-3 weeks.  Please call us at [phone] if you have not heard about the biopsies in 3 weeks.    SIGNATURES/CONFIDENTIALITY: You and/or your care partner have signed paperwork which will be entered into your electronic medical record.  These signatures attest to the fact that that the information above on your After Visit Summary has been reviewed and is understood.  Full responsibility of the confidentiality of this discharge information lies with you and/or your care-partner.

## 2023-04-24 NOTE — Progress Notes (Signed)
Called to room to assist during endoscopic procedure.  Patient ID and intended procedure confirmed with present staff. Received instructions for my participation in the procedure from the performing physician.

## 2023-04-24 NOTE — Progress Notes (Signed)
HISTORY OF PRESENT ILLNESS:  Amanda Tran is a 58 y.o. female with a history of multiple adenomatous colon polyps.  Presents today for surveillance colonoscopy.  REVIEW OF SYSTEMS:  All non-GI ROS negative except for  Past Medical History:  Diagnosis Date   Anemia    Anxiety    Diabetes (HCC)    Diabetes mellitus (HCC) 04/23/2023   DVT (deep venous thrombosis) (HCC) 03/11/2012   Left popliteal    Hyperlipidemia    Hypertension    Vitamin D deficiency     Past Surgical History:  Procedure Laterality Date   CESAREAN SECTION     1990, 1993.1994, 1996   COLONOSCOPY     TUBAL LIGATION      Social History Amanda Tran  reports that she quit smoking about 5 years ago. Her smoking use included cigarettes. She started smoking about 30 years ago. She has a 12.5 pack-year smoking history. She has never used smokeless tobacco. She reports current alcohol use. She reports that she does not use drugs.  family history includes Diabetes in her father and sister; Hypertension in her father; Kidney failure in her mother.  No Known Allergies     PHYSICAL EXAMINATION: Vital signs: BP 120/80   Pulse 95   Temp 97.6 F (36.4 C)   Ht 5\' 1"  (1.549 m)   Wt 213 lb (96.6 kg)   SpO2 100%   BMI 40.25 kg/m  General: Well-developed, well-nourished, no acute distress HEENT: Sclerae are anicteric, conjunctiva pink. Oral mucosa intact Lungs: Clear Heart: Regular Abdomen: soft, nontender, nondistended, no obvious ascites, no peritoneal signs, normal bowel sounds. No organomegaly. Extremities: No edema Psychiatric: alert and oriented x3. Cooperative     ASSESSMENT:  History of multiple adenomatous polyps   PLAN:  Surveillance colonoscopy

## 2023-04-25 ENCOUNTER — Telehealth: Payer: Self-pay | Admitting: *Deleted

## 2023-04-25 NOTE — Telephone Encounter (Signed)
  Follow up Call-     04/24/2023   10:10 AM  Call back number  Post procedure Call Back phone  # [phone]  Permission to leave phone message Yes     Patient questions:   Message left to call if necessary.

## 2023-05-04 ENCOUNTER — Other Ambulatory Visit: Payer: Self-pay

## 2023-05-04 ENCOUNTER — Emergency Department (HOSPITAL_BASED_OUTPATIENT_CLINIC_OR_DEPARTMENT_OTHER)
Admission: EM | Admit: 2023-05-04 | Discharge: 2023-05-04 | Disposition: A | Discharge status: Home / Self Care | Payer: BC Managed Care – PPO | Attending: Emergency Medicine | Admitting: Emergency Medicine

## 2023-05-04 ENCOUNTER — Encounter (HOSPITAL_BASED_OUTPATIENT_CLINIC_OR_DEPARTMENT_OTHER): Payer: Self-pay | Admitting: Emergency Medicine

## 2023-05-04 DIAGNOSIS — Z79899 Other long term (current) drug therapy: Secondary | ICD-10-CM | POA: Insufficient documentation

## 2023-05-04 DIAGNOSIS — I10 Essential (primary) hypertension: Secondary | ICD-10-CM | POA: Insufficient documentation

## 2023-05-04 DIAGNOSIS — R059 Cough, unspecified: Secondary | ICD-10-CM | POA: Diagnosis not present

## 2023-05-04 DIAGNOSIS — E119 Type 2 diabetes mellitus without complications: Secondary | ICD-10-CM | POA: Diagnosis not present

## 2023-05-04 DIAGNOSIS — U071 COVID-19: Secondary | ICD-10-CM | POA: Insufficient documentation

## 2023-05-04 DIAGNOSIS — Z7984 Long term (current) use of oral hypoglycemic drugs: Secondary | ICD-10-CM | POA: Insufficient documentation

## 2023-05-04 DIAGNOSIS — J029 Acute pharyngitis, unspecified: Secondary | ICD-10-CM | POA: Diagnosis not present

## 2023-05-04 LAB — RESP PANEL BY RT-PCR (RSV, FLU A&B, COVID)  RVPGX2
Influenza A by PCR: NEGATIVE
Influenza B by PCR: NEGATIVE
Resp Syncytial Virus by PCR: NEGATIVE
SARS Coronavirus 2 by RT PCR: POSITIVE — AB

## 2023-05-04 LAB — GROUP A STREP BY PCR: Group A Strep by PCR: NOT DETECTED

## 2023-05-04 MED ORDER — IBUPROFEN 800 MG PO TABS
800.0000 mg | ORAL_TABLET | Freq: Once | ORAL | Status: AC
  Administered 2023-05-04: 800 mg via ORAL
  Filled 2023-05-04: qty 1

## 2023-05-04 MED ORDER — PAXLOVID (150/100) 10 X 150 MG & 10 X 100MG PO TBPK
2.0000 | ORAL_TABLET | Freq: Two times a day (BID) | ORAL | 0 refills | Status: AC
  Filled 2023-05-04 – 2023-05-05 (×3): qty 20, 5d supply, fill #0

## 2023-05-04 NOTE — ED Notes (Signed)
 Po fluids encouraged.  2 cokes given to pt.

## 2023-05-04 NOTE — ED Provider Notes (Signed)
 [facility] Provider Note   CSN: [id number] Arrival date & time: 05/04/23  2017     History  Chief Complaint  Patient presents with   Sore Throat    Amanda Tran is a 58 y.o. female.  58 year old female with a history of hypertension, diabetes, and hyperlipidemia who presents to the emergency department with URI type symptoms.  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  Says that last night at 9 PM she started feeling generally weak.  Also started having congestion and a cough.  Also says that she has had a sore throat that started this morning.       Home Medications Prior to Admission medications   Medication Sig Start Date End Date Taking? Authorizing Provider  nirmatrelvir /ritonavir , renal dosing, (PAXLOVID , 150/100,) 10 x 150 MG & 10 x 100MG  TBPK Take 2 tablets by mouth 2 (two) times daily for 5 days. Dosage for moderate renal impairment (eGFR >/= 30 to <60 mL/min): 150 mg nirmatrelvir  (one 150 mg tablet) with 100 mg ritonavir  (one 100 mg tablet), with both tablets taken together twice daily for 5 days. Not recommended if eGFR < 30 mL/min. PAXLOVID  is not recommend in patients with severe hepatic impairment (Child-Pugh Class C). 05/04/23 05/09/23 Yes Yolande Lamar BROCKS, MD  albuterol  (VENTOLIN  HFA) 108 [phone] Base) MCG/ACT inhaler Inhale 2 puffs into the lungs every 6 (six) hours as needed for wheezing or shortness of breath. 11/22/22   Fleming, Zelda W, NP  amLODipine  (NORVASC ) 10 MG tablet Take 1 tablet (10 mg total) by mouth daily. 11/22/22   Fleming, Zelda W, NP  atorvastatin  (LIPITOR) 20 MG tablet Take 1 tablet (20 mg total) by mouth daily. 11/22/22   Fleming, Zelda W, NP  Blood Glucose Monitoring Suppl (TRUE METRIX METER) w/Device KIT Use as instructed to heck blood glucose level by fingerstick 3 times per day. 10/21/22   Fleming, Zelda W, NP  glimepiride  (AMARYL ) 4 MG tablet Take 2  tablets (8 mg total) by mouth daily with breakfast. 11/22/22   Fleming, Zelda W, NP  glucose blood (TRUE METRIX BLOOD GLUCOSE TEST) test strip Use as instructed to check blood glucose levels by fingerstick 3 times per day. 12/24/21   Fleming, Zelda W, NP  lisinopril  (ZESTRIL ) 10 MG tablet Take 1 tablet (10 mg total) by mouth daily. 11/22/22   Fleming, Zelda W, NP  mupirocin  ointment (BACTROBAN ) 2 % Apply 1 Application topically 2 (two) times daily. 08/22/22   Fleming, Zelda W, NP  Semaglutide , 2 MG/DOSE, 8 MG/3ML SOPN Inject 2 mg as directed once a week. 02/28/23   Fleming, Zelda W, NP  traZODone  (DESYREL ) 50 MG tablet Take 0.5-1 tablets (25-50 mg total) by mouth at bedtime as needed for sleep. 11/22/22   Fleming, Zelda W, NP  triamcinolone  cream (KENALOG ) 0.1 % Apply 1 application topically daily until resolved. 03/20/23   Newlin, Enobong, MD  TRUEplus Lancets 28G MISC Use as instructed to check blood glucose level by fingerstick 3 times per day. 12/24/21   Fleming, Zelda W, NP      Allergies    Patient has no known allergies.    Review of Systems   Review of Systems  Physical Exam Updated Vital Signs BP (!) 156/105   Pulse (!) 111   Temp 98.8 F (37.1 C) (Oral)   Resp 16   Ht 5' 1 (1.549 m)   Wt 96.6 kg  SpO2 98%   BMI 40.24 kg/m  Physical Exam Vitals and nursing note reviewed.  Constitutional:      General: She is not in acute distress.    Appearance: She is well-developed.  HENT:     Head: Normocephalic and atraumatic.     Right Ear: External ear normal.     Left Ear: External ear normal.     Nose: Nose normal.     Mouth/Throat:     Mouth: Mucous membranes are moist.     Pharynx: Oropharynx is clear. Posterior oropharyngeal erythema present. No oropharyngeal exudate.  Eyes:     Extraocular Movements: Extraocular movements intact.     Conjunctiva/sclera: Conjunctivae normal.     Pupils: Pupils are equal, round, and reactive to light.  Cardiovascular:     Rate and Rhythm:  Regular rhythm. Tachycardia present.     Heart sounds: No murmur heard. Pulmonary:     Effort: Pulmonary effort is normal. No respiratory distress.     Breath sounds: Normal breath sounds.  Musculoskeletal:     Cervical back: Normal range of motion and neck supple.     Right lower leg: No edema.     Left lower leg: No edema.  Skin:    General: Skin is warm and dry.  Neurological:     Mental Status: She is alert and oriented to person, place, and time. Mental status is at baseline.  Psychiatric:        Mood and Affect: Mood normal.     ED Results / Procedures / Treatments   Labs (all labs ordered are listed, but only abnormal results are displayed) Labs Reviewed  RESP PANEL BY RT-PCR (RSV, FLU A&B, COVID)  [id number] - Abnormal; Notable for the following components:      Result Value   SARS Coronavirus 2 by RT PCR POSITIVE (*)    All other components within normal limits  GROUP A STREP BY PCR    EKG None  Radiology No results found.  Procedures Procedures    Medications Ordered in ED Medications  ibuprofen  (ADVIL ) tablet 800 mg (800 mg Oral Given 05/04/23 2118)    ED Course/ Medical Decision Making/ A&P Clinical Course as of 05/04/23 2229  Sun May 04, 2023  2130 SARS Coronavirus 2 by RT PCR(!): POSITIVE [RP]    Clinical Course User Index [RP] Yolande Lamar BROCKS, MD                                 Medical Decision Making Amount and/or Complexity of Data Reviewed Labs:  Decision-making details documented in ED Course.  Risk Prescription drug management.   Amanda Tran is a 58 y.o. female with comorbidities that complicate the patient evaluation including  hypertension, diabetes, and hyperlipidemia who presents to the emergency department with URI type symptoms and sore throat  Initial Ddx:  Strep throat, PTA, RPA, viral pharyngitis, pneumonia   MDM:  Patient presents emergency department sore throat and URI symptoms that started last night.  On exam  she is overall well-appearing without any concerns for airway compromise.  Lungs are clear to auscultation bilaterally still low concern for pneumonia.  Is somewhat tachycardic though.  Feel the patient likely has pharyngitis or URI based on their symptoms. Tolerating secretions and no uvular deviation no be concerning for peritonsillar abscess.  Neck is supple no concerns for RPA at this time.  They are overall well-appearing so do  not feel that chest x-ray is warranted.   Plan:  COVID/flu Strep test  ED Summary/Re-evaluation:  Patient was ultimately found to have COVID.  She satting well on room air at this time.  With her BMI and other comorbidities we will go ahead and start her on Paxlovid .  She did have a GFR of 57 on lab work that was drawn several months ago.  Will start her on the reduced dose.  Also instructed to hold her Lipitor until she completes her Paxlovid  course.  Return precautions discussed prior to discharge.  Will have her follow-up with her primary doctor in several days.  This patient presents to the ED for concern of complaints listed in HPI, this involves an extensive number of treatment options, and is a complaint that carries with it a high risk of complications and morbidity. Disposition including potential need for admission considered.   Dispo: DC Home. Return precautions discussed including, but not limited to, those listed in the AVS. Allowed pt time to ask questions which were answered fully prior to dc.  Records reviewed Outpatient Clinic Notes I personally reviewed and interpreted cardiac monitoring: normal sinus rhythm  I have reviewed the patients home medications and made adjustments as needed    Final Clinical Impression(s) / ED Diagnoses Final diagnoses:  Pharyngitis, unspecified etiology  COVID-19    Rx / DC Orders ED Discharge Orders          Ordered    nirmatrelvir /ritonavir , renal dosing, (PAXLOVID , 150/100,) 10 x 150 MG & 10 x 100MG  TBPK  2  times daily        05/04/23 2135              Yolande Lamar BROCKS, MD 05/04/23 2229

## 2023-05-04 NOTE — ED Triage Notes (Signed)
Pt c/o sore throat since this morning. 

## 2023-05-04 NOTE — Discharge Instructions (Addendum)
 You were seen for your covid infection in the emergency department.   At home, please use Tylenol  for your muscle aches and fevers.  Please use over-the-counter cough medication or tea with honey for your cough.  Take the Paxlovid  for your COVID infection.  Hold your Lipitor while you are on Paxlovid  and do not take it  until 48 hours after you complete the treatment.  Follow-up with your primary doctor in 2-3 days regarding your visit.  This may be over the phone if you are feeling better or in person if you are feeling worse.  Return immediately to the emergency department if you experience any of the following: Difficulty breathing, or any other concerning symptoms.    Thank you for visiting our Emergency Department. It was a pleasure taking care of you today.

## 2023-05-05 ENCOUNTER — Other Ambulatory Visit (HOSPITAL_COMMUNITY): Payer: Self-pay

## 2023-05-05 ENCOUNTER — Other Ambulatory Visit: Payer: Self-pay

## 2023-05-08 ENCOUNTER — Ambulatory Visit: Payer: Medicaid Other | Admitting: Pediatrics

## 2023-05-22 ENCOUNTER — Emergency Department (HOSPITAL_COMMUNITY): Payer: BC Managed Care – PPO

## 2023-05-22 ENCOUNTER — Emergency Department (HOSPITAL_COMMUNITY)
Admission: EM | Admit: 2023-05-22 | Discharge: 2023-05-22 | Disposition: A | Discharge status: Home / Self Care | Payer: BC Managed Care – PPO | Attending: Emergency Medicine | Admitting: Emergency Medicine

## 2023-05-22 DIAGNOSIS — Z7984 Long term (current) use of oral hypoglycemic drugs: Secondary | ICD-10-CM | POA: Diagnosis not present

## 2023-05-22 DIAGNOSIS — R109 Unspecified abdominal pain: Secondary | ICD-10-CM | POA: Diagnosis not present

## 2023-05-22 DIAGNOSIS — R197 Diarrhea, unspecified: Secondary | ICD-10-CM | POA: Diagnosis not present

## 2023-05-22 DIAGNOSIS — R112 Nausea with vomiting, unspecified: Secondary | ICD-10-CM | POA: Diagnosis not present

## 2023-05-22 DIAGNOSIS — I10 Essential (primary) hypertension: Secondary | ICD-10-CM | POA: Diagnosis not present

## 2023-05-22 DIAGNOSIS — Z9049 Acquired absence of other specified parts of digestive tract: Secondary | ICD-10-CM | POA: Diagnosis not present

## 2023-05-22 DIAGNOSIS — M25511 Pain in right shoulder: Secondary | ICD-10-CM | POA: Diagnosis not present

## 2023-05-22 DIAGNOSIS — E119 Type 2 diabetes mellitus without complications: Secondary | ICD-10-CM | POA: Diagnosis not present

## 2023-05-22 DIAGNOSIS — Z79899 Other long term (current) drug therapy: Secondary | ICD-10-CM | POA: Diagnosis not present

## 2023-05-22 LAB — URINALYSIS, ROUTINE W REFLEX MICROSCOPIC
Bilirubin Urine: NEGATIVE
Glucose, UA: NEGATIVE mg/dL
Ketones, ur: 5 mg/dL — AB
Nitrite: NEGATIVE
Protein, ur: NEGATIVE mg/dL
Specific Gravity, Urine: 1.026 (ref 1.005–1.030)
pH: 5 (ref 5.0–8.0)

## 2023-05-22 LAB — COMPREHENSIVE METABOLIC PANEL
ALT: 33 U/L (ref 0–44)
AST: 31 U/L (ref 15–41)
Albumin: 4 g/dL (ref 3.5–5.0)
Alkaline Phosphatase: 99 U/L (ref 38–126)
Anion gap: 11 (ref 5–15)
BUN: 12 mg/dL (ref 6–20)
CO2: 23 mmol/L (ref 22–32)
Calcium: 8.8 mg/dL — ABNORMAL LOW (ref 8.9–10.3)
Chloride: 106 mmol/L (ref 98–111)
Creatinine, Ser: 0.96 mg/dL (ref 0.44–1.00)
GFR, Estimated: >60 mL/min (ref >60)
Glucose, Bld: 119 mg/dL — ABNORMAL HIGH (ref 70–99)
Potassium: 3.6 mmol/L (ref 3.5–5.1)
Sodium: 140 mmol/L (ref 135–145)
Total Bilirubin: 0.7 mg/dL (ref 0.0–1.2)
Total Protein: 8 g/dL (ref 6.5–8.1)

## 2023-05-22 LAB — RESP PANEL BY RT-PCR (RSV, FLU A&B, COVID)  RVPGX2
Influenza A by PCR: NEGATIVE
Influenza B by PCR: NEGATIVE
Resp Syncytial Virus by PCR: NEGATIVE
SARS Coronavirus 2 by RT PCR: NEGATIVE

## 2023-05-22 LAB — CBC
HCT: 47.7 % — ABNORMAL HIGH (ref 36.0–46.0)
Hemoglobin: 15.4 g/dL — ABNORMAL HIGH (ref 12.0–15.0)
MCH: 26.7 pg (ref 26.0–34.0)
MCHC: 32.3 g/dL (ref 30.0–36.0)
MCV: 82.7 fL (ref 80.0–100.0)
Platelets: 294 10*3/uL (ref 150–400)
RBC: 5.77 MIL/uL — ABNORMAL HIGH (ref 3.87–5.11)
RDW: 15.5 % (ref 11.5–15.5)
WBC: 5.5 10*3/uL (ref 4.0–10.5)
nRBC: 0 % (ref 0.0–0.2)

## 2023-05-22 LAB — HCG, QUANTITATIVE, PREGNANCY: hCG, Beta Chain, Quant, S: 3 m[IU]/mL (ref <5)

## 2023-05-22 LAB — LIPASE, BLOOD: Lipase: 27 U/L (ref 11–51)

## 2023-05-22 MED ORDER — ONDANSETRON 4 MG PO TBDP
4.0000 mg | ORAL_TABLET | Freq: Once | ORAL | Status: AC | PRN
  Administered 2023-05-22: 4 mg via ORAL
  Filled 2023-05-22: qty 1

## 2023-05-22 MED ORDER — ONDANSETRON 4 MG PO TBDP: 4.0000 mg | ORAL_TABLET | Freq: Three times a day (TID) | ORAL | 0 refills | Status: DC | PRN

## 2023-05-22 MED ORDER — SODIUM CHLORIDE 0.9 % IV BOLUS
1000.0000 mL | Freq: Once | INTRAVENOUS | Status: AC
  Administered 2023-05-22: 1000 mL via INTRAVENOUS

## 2023-05-22 MED ORDER — IOHEXOL 300 MG/ML  SOLN
100.0000 mL | Freq: Once | INTRAMUSCULAR | Status: AC | PRN
  Administered 2023-05-22: 100 mL via INTRAVENOUS

## 2023-05-22 NOTE — ED Triage Notes (Signed)
 Pt states that at 0500 she was awoken from sleep with sudden onset diarrhea. Pt also states that since then she's been having frequent N/V. Pt denies suspicious food intake. No URI symptoms.

## 2023-05-22 NOTE — ED Provider Notes (Signed)
 [place] EMERGENCY DEPARTMENT AT [facility] Provider Note   CSN: [id number] Arrival date & time: 05/22/23  1228     History  Chief Complaint  Patient presents with   Emesis   Diarrhea    Amanda Tran is a 58 y.o. female with a past medical history of IDA, HTN, T2DM non-insulin-dependent presents emergency department for evaluation of nausea, abdominal pain, vomiting, diarrhea that started at 0500 this morning.  She reports that she was laying on her stomach and woke up to vomiting and diarrhea for "45 minutes straight".  She endorses vomiting "over 100 times" today.  She endorses that she also had bodyaches and chills yesterday.  She denies new or suspicious foods, fevers, congestion, cough, chest pain, shortness of breath.  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  She was seen on 05/04/2023 and diagnosed with COVID.  Following this, she reports resolution of symptoms and that this seems unrelated.  She also complains of right shoulder pain for past month. She denies known injury but reports that she lifts frequently in her job. She is interested in orthopedic referral. She denies swelling, warmth, recent falls.  The history is provided by the patient. No language interpreter was used.  Emesis Associated symptoms: diarrhea   Diarrhea Associated symptoms: vomiting      Home Medications Prior to Admission medications   Medication Sig Start Date End Date Taking? Authorizing Provider  ondansetron (ZOFRAN-ODT) 4 MG disintegrating tablet Take 1 tablet (4 mg total) by mouth every 8 (eight) hours as needed for nausea or vomiting. 05/22/23  Yes Judithann Sheen, PA  albuterol (VENTOLIN HFA) 108 (90 Base) MCG/ACT inhaler Inhale 2 puffs into the lungs every 6 (six) hours as needed for wheezing or shortness of breath. 11/22/22   Claiborne Rigg, NP  amLODipine (NORVASC) 10 MG tablet Take 1  tablet (10 mg total) by mouth daily. 11/22/22   Claiborne Rigg, NP  atorvastatin (LIPITOR) 20 MG tablet Take 1 tablet (20 mg total) by mouth daily. 11/22/22   Claiborne Rigg, NP  Blood Glucose Monitoring Suppl (TRUE METRIX METER) w/Device KIT Use as instructed to heck blood glucose level by fingerstick 3 times per day. 10/21/22   Claiborne Rigg, NP  glimepiride (AMARYL) 4 MG tablet Take 2 tablets (8 mg total) by mouth daily with breakfast. 11/22/22   Claiborne Rigg, NP  glucose blood (TRUE METRIX BLOOD GLUCOSE TEST) test strip Use as instructed to check blood glucose levels by fingerstick 3 times per day. 12/24/21   Claiborne Rigg, NP  lisinopril (ZESTRIL) 10 MG tablet Take 1 tablet (10 mg total) by mouth daily. 11/22/22   Claiborne Rigg, NP  mupirocin ointment (BACTROBAN) 2 % Apply 1 Application topically 2 (two) times daily. 08/22/22   Claiborne Rigg, NP  Semaglutide, 2 MG/DOSE, 8 MG/3ML SOPN Inject 2 mg as directed once a week. 02/28/23   Claiborne Rigg, NP  traZODone (DESYREL) 50 MG tablet Take 0.5-1 tablets (25-50 mg total) by mouth at bedtime as needed for sleep. 11/22/22   Claiborne Rigg, NP  triamcinolone cream (KENALOG) 0.1 % Apply 1 application topically daily until resolved. 03/20/23   Hoy Register, MD  TRUEplus Lancets 28G MISC Use as instructed to check blood glucose level by fingerstick 3 times per day. 12/24/21   Claiborne Rigg, NP      Allergies  Patient has no known allergies.    Review of Systems   Review of Systems  Gastrointestinal:  Positive for diarrhea and vomiting.    Physical Exam Updated Vital Signs BP 121/87   Pulse (!) 116   Temp 97.8 F (36.6 C) (Oral)   Resp 18   SpO2 97%  Physical Exam Vitals and nursing note reviewed.  Constitutional:      General: She is not in acute distress.    Appearance: Normal appearance.  HENT:     Head: Normocephalic and atraumatic.     Right Ear: Tympanic membrane, ear canal and external ear normal.     Left  Ear: Tympanic membrane, ear canal and external ear normal.     Nose: Nose normal.     Mouth/Throat:     Mouth: Mucous membranes are moist.  Eyes:     Conjunctiva/sclera: Conjunctivae normal.  Cardiovascular:     Rate and Rhythm: Tachycardia present.     Pulses:          Radial pulses are 2+ on the right side and 2+ on the left side.       Dorsalis pedis pulses are 2+ on the right side and 2+ on the left side.     Comments: Mild tachycardia at 105 bpm Pulmonary:     Effort: Pulmonary effort is normal. No respiratory distress.     Breath sounds: Normal breath sounds.  Abdominal:     General: Bowel sounds are normal. There is no distension.     Palpations: Abdomen is soft. There is no mass.     Tenderness: There is abdominal tenderness (diffuse). There is no guarding or rebound.     Hernia: No hernia is present.  Musculoskeletal:     Right lower leg: No edema.     Left lower leg: No edema.     Comments: No swelling, warmth, erythema to right shoulder. Full right shoulder flexion and extension but has pain with complete flexion. No obvious deformity, crepitus noted.  Skin:    Capillary Refill: Capillary refill takes less than 2 seconds.     Coloration: Skin is not jaundiced or pale.  Neurological:     Mental Status: She is alert and oriented to person, place, and time. Mental status is at baseline.           ED Results / Procedures / Treatments   Labs (all labs ordered are listed, but only abnormal results are displayed) Labs Reviewed  CBC - Abnormal; Notable for the following components:      Result Value   RBC 5.77 (*)    Hemoglobin 15.4 (*)    HCT 47.7 (*)    All other components within normal limits  URINALYSIS, ROUTINE W REFLEX MICROSCOPIC - Abnormal; Notable for the following components:   APPearance HAZY (*)    Hgb urine dipstick SMALL (*)    Ketones, ur 5 (*)    Leukocytes,Ua SMALL (*)    Bacteria, UA MANY (*)    All other components within normal limits   COMPREHENSIVE METABOLIC PANEL - Abnormal; Notable for the following components:   Glucose, Bld 119 (*)    Calcium 8.8 (*)    All other components within normal limits  RESP PANEL BY RT-PCR (RSV, FLU A&B, COVID)  RVPGX2  LIPASE, BLOOD  HCG, QUANTITATIVE, PREGNANCY    EKG None  Radiology CT ABDOMEN PELVIS W CONTRAST Result Date: 05/22/2023 CLINICAL DATA:  Acute abdominal pain EXAM: CT ABDOMEN AND PELVIS WITH  CONTRAST TECHNIQUE: Multidetector CT imaging of the abdomen and pelvis was performed using the standard protocol following bolus administration of intravenous contrast. RADIATION DOSE REDUCTION: This exam was performed according to the departmental dose-optimization program which includes automated exposure control, adjustment of the mA and/or kV according to patient size and/or use of iterative reconstruction technique. CONTRAST:  OMNIPAQUE IOHEXOL 300 MG/ML  SOLN COMPARISON:  CT abdomen and pelvis 01/20/2022 FINDINGS: Lower chest: There is a 3 mm nodular density in the right lower lobe which is unchanged. Hepatobiliary: No focal liver abnormality is seen. Status post cholecystectomy. No biliary dilatation. Pancreas: Unremarkable. No pancreatic ductal dilatation or surrounding inflammatory changes. Spleen: Normal in size without focal abnormality. Adrenals/Urinary Tract: Adrenal glands are unremarkable. Kidneys are normal, without renal calculi, focal lesion, or hydronephrosis. Bladder is unremarkable. Stomach/Bowel: Stomach is within normal limits. Appendix appears normal. No evidence of bowel wall thickening, distention, or inflammatory changes. Vascular/Lymphatic: No significant vascular findings are present. No enlarged abdominal or pelvic lymph nodes. Reproductive: Uterus and bilateral adnexa are unremarkable. Other: No abdominal wall hernia or abnormality. There is trace free fluid in the pelvis. Musculoskeletal: No acute or significant osseous findings. IMPRESSION: 1. No acute  localizing process in the abdomen or pelvis. 2. Trace free fluid in the pelvis, likely physiologic. 3. Stable 3 mm nodular density in the right lower lobe. No follow-up needed if patient is low-risk.This recommendation follows the consensus statement: Guidelines for Management of Incidental Pulmonary Nodules Detected on CT Images: From the Fleischner Society 2017; Radiology 2017; 284:228-243. Electronically Signed   By: Darliss Cheney M.D.   On: 05/22/2023 19:46    Procedures Procedures    Medications Ordered in ED Medications  ondansetron (ZOFRAN-ODT) disintegrating tablet 4 mg (4 mg Oral Given 05/22/23 1302)  sodium chloride 0.9 % bolus 1,000 mL (0 mLs Intravenous Stopped 05/22/23 1857)  iohexol (OMNIPAQUE) 300 MG/ML solution 100 mL (100 mLs Intravenous Contrast Given 05/22/23 1743)    ED Course/ Medical Decision Making/ A&P                                 Medical Decision Making Amount and/or Complexity of Data Reviewed Labs: ordered. Radiology: ordered.  Risk Prescription drug management.   Patient presents to the ED for concern of N/V/D and right shoulder pain, this involves an extensive number of treatment options, and is a complaint that carries with it a high risk of complications and morbidity.   The differential diagnosis of nausea, vomiting, diarrhea includes gastroenteritis, food poisoning, pancreatitis, bacterial infection, C. difficile The differential diagnosis of right shoulder pain includes gout, septic joint, fracture, dislocation, ligamentous injury, strain   Co morbidities that complicate the patient evaluation  See HPI   Additional history obtained:  Additional history obtained from Nursing   External records from outside source obtained and reviewed including triage RN note   Lab Tests:  I Ordered, and personally interpreted labs.  The pertinent results include:   Lipase negative hCG negative No leukocytosis nor anemia UA shows bacteria but she  denies urinary symptoms   Imaging Studies ordered:  I ordered imaging studies including CT abd pelvis  I independently visualized and interpreted imaging which showed no acute abnormality I agree with the radiologist interpretation    Medicines ordered and prescription drug management:  I ordered medication including zofran, NS  for N/V/D  Reevaluation of the patient after these medicines showed that the patient improved  I have reviewed the patients home medicines and have made adjustments as needed    Problem List / ED Course:  Nausea, vomiting, diarrhea Likely secondary to viral gastroenteritis as 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  No vomiting or diarrhea in emergency department She is diffusely tender to palpation of abdomen.  CT scan negative for acute pathology to explain symptoms Respiratory panel negative Passed PO challenge. Do not believe stool sample is required at this time as she denies blood in stool and description does not sound like c diff. However, did warn her of these precautions and to return if she experiences them Will provide zofran prescription for nausea, vomiting Discussed importance of oral hydration Discussed ED workup, disposition, return to emerged part precautions with patient expressed understanding with the plan.  All questions answered to her satisfaction.  She is agreeable discharge.  Discharge instructions provided on paperwork. Right shoulder pain Low concern for septic joint, gout, acute fracture, dislocation with reassuring physical exam I offered to x-ray right shoulder however patient reports that she would prefer orthopedics to do this as she does not believe she had recent trauma or fracture Will provide orthopedic follow-up   Reevaluation:  After the interventions noted above, I reevaluated the patient and found that they have :improved   Social  Determinants of Health:  Has PCP f/u   Dispostion:  After consideration of the diagnostic results and the patients response to treatment, I feel that the patent would benefit from outpatient management with symptomatic care.    Final Clinical Impression(s) / ED Diagnoses Final diagnoses:  Nausea vomiting and diarrhea  Right shoulder pain, unspecified chronicity    Rx / DC Orders ED Discharge Orders          Ordered    ondansetron (ZOFRAN-ODT) 4 MG disintegrating tablet  Every 8 hours PRN        05/22/23 2005              Judithann Sheen, PA 05/22/23 2108    Terrilee Files, MD 05/23/23 [phone]

## 2023-05-22 NOTE — Discharge Instructions (Signed)
 Thank you for letting us evaluate you today.  Your CT scan was not remarkable for any etiology of symptoms.  You likely have a viral stomach bug.  I have sent Zofran to your pharmacy to use as needed for nausea and vomiting.  Most importantly, make sure you maintain oral hydration (Gatorade, Pedialyte, low sugar juices, chicken broth).  Return to emergency department if you become significantly dehydrated

## 2023-05-23 ENCOUNTER — Other Ambulatory Visit: Payer: Self-pay

## 2023-05-29 ENCOUNTER — Other Ambulatory Visit: Payer: Self-pay | Admitting: Nurse Practitioner

## 2023-05-29 DIAGNOSIS — F5101 Primary insomnia: Secondary | ICD-10-CM

## 2023-05-29 DIAGNOSIS — E1165 Type 2 diabetes mellitus with hyperglycemia: Secondary | ICD-10-CM

## 2023-05-30 ENCOUNTER — Other Ambulatory Visit (HOSPITAL_COMMUNITY): Payer: Self-pay

## 2023-05-30 ENCOUNTER — Other Ambulatory Visit: Payer: Self-pay

## 2023-05-30 MED ORDER — TRAZODONE HCL 50 MG PO TABS
25.0000 mg | ORAL_TABLET | Freq: Every evening | ORAL | 0 refills | Status: DC | PRN
  Filled 2023-05-30 (×2): qty 30, 30d supply, fill #0

## 2023-05-30 MED ORDER — OZEMPIC (2 MG/DOSE) 8 MG/3ML ~~LOC~~ SOPN
2.0000 mg | PEN_INJECTOR | SUBCUTANEOUS | 1 refills | Status: DC
  Filled 2023-05-30 – 2023-08-05 (×3): qty 3, 28d supply, fill #0
  Filled 2023-08-05 (×2): qty 3, 28d supply, fill #1

## 2023-05-31 ENCOUNTER — Other Ambulatory Visit: Payer: Self-pay | Admitting: Nurse Practitioner

## 2023-05-31 ENCOUNTER — Other Ambulatory Visit: Payer: Self-pay | Admitting: Family Medicine

## 2023-05-31 ENCOUNTER — Other Ambulatory Visit: Payer: Self-pay

## 2023-05-31 DIAGNOSIS — F5101 Primary insomnia: Secondary | ICD-10-CM

## 2023-06-02 ENCOUNTER — Encounter: Payer: Self-pay | Admitting: Nurse Practitioner

## 2023-06-02 ENCOUNTER — Other Ambulatory Visit: Payer: Self-pay

## 2023-06-02 ENCOUNTER — Other Ambulatory Visit (HOSPITAL_COMMUNITY)
Admission: RE | Admit: 2023-06-02 | Discharge: 2023-06-02 | Disposition: A | Discharge status: Home / Self Care | Source: Ambulatory Visit | Attending: Nurse Practitioner | Admitting: Nurse Practitioner

## 2023-06-02 ENCOUNTER — Other Ambulatory Visit (HOSPITAL_COMMUNITY): Payer: Self-pay

## 2023-06-02 ENCOUNTER — Ambulatory Visit: Payer: Medicaid Other | Attending: Nurse Practitioner | Admitting: Nurse Practitioner

## 2023-06-02 VITALS — BP 142/90 | HR 98 | Ht 61.0 in | Wt 213.8 lb

## 2023-06-02 DIAGNOSIS — Z124 Encounter for screening for malignant neoplasm of cervix: Secondary | ICD-10-CM

## 2023-06-02 DIAGNOSIS — E1165 Type 2 diabetes mellitus with hyperglycemia: Secondary | ICD-10-CM | POA: Diagnosis not present

## 2023-06-02 DIAGNOSIS — R8290 Unspecified abnormal findings in urine: Secondary | ICD-10-CM

## 2023-06-02 DIAGNOSIS — M25511 Pain in right shoulder: Secondary | ICD-10-CM

## 2023-06-02 DIAGNOSIS — G8929 Other chronic pain: Secondary | ICD-10-CM | POA: Diagnosis not present

## 2023-06-02 DIAGNOSIS — F5101 Primary insomnia: Secondary | ICD-10-CM

## 2023-06-02 DIAGNOSIS — Z113 Encounter for screening for infections with a predominantly sexual mode of transmission: Secondary | ICD-10-CM | POA: Diagnosis not present

## 2023-06-02 DIAGNOSIS — A599 Trichomoniasis, unspecified: Secondary | ICD-10-CM

## 2023-06-02 MED ORDER — TRAZODONE HCL 50 MG PO TABS
25.0000 mg | ORAL_TABLET | Freq: Every evening | ORAL | 1 refills | Status: DC | PRN
  Filled 2023-06-02: qty 90, 90d supply, fill #0
  Filled 2023-08-30: qty 30, 30d supply, fill #0
  Filled 2023-10-20: qty 30, 30d supply, fill #1
  Filled 2023-12-18 – 2023-12-22 (×2): qty 30, 30d supply, fill #2
  Filled 2024-03-12: qty 30, 30d supply, fill #3

## 2023-06-02 MED ORDER — TRIAMCINOLONE ACETONIDE 0.1 % EX CREA
1.0000 | TOPICAL_CREAM | Freq: Every day | CUTANEOUS | 1 refills | Status: DC
  Filled 2023-06-02 (×3): qty 45, 30d supply, fill #0
  Filled 2023-06-02: qty 15, 30d supply, fill #0
  Filled 2023-06-02: qty 45, 30d supply, fill #0
  Filled 2023-06-16 – 2023-08-05 (×2): qty 45, 30d supply, fill #1
  Filled 2023-08-05: qty 45, 30d supply, fill #0
  Filled 2023-08-05: qty 45, 30d supply, fill #1

## 2023-06-02 MED ORDER — TRUEPLUS LANCETS 28G MISC
3 refills | Status: AC
  Filled 2023-06-02 – 2023-10-20 (×3): qty 100, 30d supply, fill #0

## 2023-06-02 MED ORDER — GLIMEPIRIDE 4 MG PO TABS
8.0000 mg | ORAL_TABLET | Freq: Every day | ORAL | 1 refills | Status: DC
  Filled 2023-06-02 – 2023-06-16 (×2): qty 180, 90d supply, fill #0
  Filled 2023-06-23: qty 60, 30d supply, fill #0
  Filled 2023-06-23: qty 180, 90d supply, fill #0
  Filled 2023-08-05 (×3): qty 60, 30d supply, fill #1
  Filled 2023-08-23 – 2023-08-28 (×2): qty 60, 30d supply, fill #2
  Filled 2023-10-20: qty 60, 30d supply, fill #3

## 2023-06-02 NOTE — Progress Notes (Signed)
 Assessment & Plan:  [name] was seen today for gynecologic exam.  Diagnoses and all orders for this visit:  Encounter for Papanicolaou smear of cervix -     Cervicovaginal ancillary only -     Cytology - PAP  Chronic right shoulder pain -     DG Shoulder Right; Future -     Ambulatory referral to Orthopedic Surgery  Abnormal urine odor -     Urinalysis, Complete     Patient has been counseled on age-appropriate routine health concerns for screening and prevention. These are reviewed and up-to-date. Referrals have been placed accordingly. Immunizations are up-to-date or declined.    Subjective:   Chief Complaint  Patient presents with   Gynecologic Exam    Amanda Tran 58 y.o. female presents to office today for pap smear and with complaints of right shoulder pain and abnormal vaginal odor. She is unsure is she could have a UTI or vaginitis.  She has a past medical history of Anemia, Anxiety, Diabetes, DVT(03/11/2012), Hyperlipidemia, Hypertension, and Vitamin D deficiency.    Right shoulder pain She endorses chronic right shoulder pain. Unrelated to any specific injury or trauma. 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  GU She has been experiencing a foul odor from her vagina.   Review of Systems  Constitutional:  Negative for fever, malaise/fatigue and weight loss.  HENT: Negative.  Negative for nosebleeds.   Eyes: Negative.  Negative for blurred vision, double vision and photophobia.  Respiratory: Negative.  Negative for cough and shortness of breath.   Cardiovascular: Negative.  Negative for chest pain, palpitations and leg swelling.  Gastrointestinal: Negative.  Negative for heartburn, nausea and vomiting.  Genitourinary: Negative.   Musculoskeletal:  Positive for joint pain. Negative for myalgias.  Skin: Negative.   Neurological: Negative.  Negative for dizziness, focal weakness, seizures and headaches.   Endo/Heme/Allergies: Negative.   Psychiatric/Behavioral:  Negative for suicidal ideas. The patient has insomnia.     Past Medical History:  Diagnosis Date   Anemia    Anxiety    Diabetes (HCC)    Diabetes mellitus (HCC) 04/23/2023   DVT (deep venous thrombosis) (HCC) 03/11/2012   Left popliteal    Hyperlipidemia    Hypertension    Vitamin D deficiency     Past Surgical History:  Procedure Laterality Date   CESAREAN SECTION     1990, 1993.1994, 1996   COLONOSCOPY     TUBAL LIGATION      Family History  Problem Relation Age of Onset   Kidney failure Mother    Hypertension Father    Diabetes Father    Diabetes Sister    Colon cancer Neg Hx    Esophageal cancer Neg Hx    Stomach cancer Neg Hx    Rectal cancer Neg Hx    Breast cancer Neg Hx    Colon polyps Neg Hx     Social History Reviewed with no changes to be made today.   Outpatient Medications Prior to Visit  Medication Sig Dispense Refill   albuterol (VENTOLIN HFA) 108 (90 Base) MCG/ACT inhaler Inhale 2 puffs into the lungs every 6 (six) hours as needed for wheezing or shortness of breath. 6.7 g 0   amLODipine (NORVASC) 10 MG tablet Take 1 tablet (10 mg total) by mouth daily. 90 tablet 1   atorvastatin (LIPITOR) 20 MG tablet Take 1 tablet (20 mg total) by mouth daily. 90 tablet 2   Blood  Glucose Monitoring Suppl (TRUE METRIX METER) w/Device KIT Use as instructed to heck blood glucose level by fingerstick 3 times per day. 1 kit 0   glucose blood (TRUE METRIX BLOOD GLUCOSE TEST) test strip Use as instructed to check blood glucose levels by fingerstick 3 times per day. 100 each 12   lisinopril (ZESTRIL) 10 MG tablet Take 1 tablet (10 mg total) by mouth daily. 90 tablet 1   mupirocin ointment (BACTROBAN) 2 % Apply 1 Application topically 2 (two) times daily. 60 g 10   Semaglutide, 2 MG/DOSE, (OZEMPIC, 2 MG/DOSE,) 8 MG/3ML SOPN Inject 2 mg as directed once a week. 3 mL 1   glimepiride (AMARYL) 4 MG tablet Take 2  tablets (8 mg total) by mouth daily with breakfast. 180 tablet 1   traZODone (DESYREL) 50 MG tablet Take 0.5-1 tablets (25-50 mg total) by mouth at bedtime as needed for sleep. 30 tablet 0   TRUEplus Lancets 28G MISC Use as instructed to check blood glucose level by fingerstick 3 times per day. 100 each 3   ondansetron (ZOFRAN-ODT) 4 MG disintegrating tablet Take 1 tablet (4 mg total) by mouth every 8 (eight) hours as needed for nausea or vomiting. (Patient not taking: Reported on 06/02/2023) 20 tablet 0   triamcinolone cream (KENALOG) 0.1 % Apply 1 application topically daily until resolved. (Patient not taking: Reported on 06/02/2023) 45 g 0   No facility-administered medications prior to visit.    No Known Allergies     Objective:    BP (!) 142/90 (BP Location: Left Arm, Patient Position: Sitting, Cuff Size: Normal)   Pulse 98   Ht 5\' 1"  (1.549 m)   Wt 213 lb 12.8 oz (97 kg)   LMP  (LMP Unknown)   SpO2 98%   BMI 40.40 kg/m  Wt Readings from Last 3 Encounters:  06/02/23 213 lb 12.8 oz (97 kg)  05/04/23 212 lb 15.4 oz (96.6 kg)  04/24/23 213 lb (96.6 kg)    Physical Exam Exam conducted with a chaperone present.  Constitutional:      Appearance: She is well-developed.  HENT:     Head: Normocephalic.  Cardiovascular:     Rate and Rhythm: Normal rate and regular rhythm.     Heart sounds: Normal heart sounds.  Pulmonary:     Effort: Pulmonary effort is normal.     Breath sounds: Normal breath sounds.  Abdominal:     General: Bowel sounds are normal.     Palpations: Abdomen is soft.     Hernia: There is no hernia in the left inguinal area.  Genitourinary:    Exam position: Lithotomy position.     Labia:        Right: No rash, tenderness, lesion or injury.        Left: No rash, tenderness, lesion or injury.      Vagina: Normal. No signs of injury and foreign body. No vaginal discharge, erythema, tenderness or bleeding.     Cervix: Normal.     Uterus: Not deviated and not  enlarged.      Adnexa:        Right: No mass, tenderness or fullness.         Left: No mass, tenderness or fullness.       Rectum: Normal. No external hemorrhoid.  Lymphadenopathy:     Lower Body: No right inguinal adenopathy. No left inguinal adenopathy.  Skin:    General: Skin is warm and dry.  Neurological:  Mental Status: She is alert and oriented to person, place, and time.  Psychiatric:        Behavior: Behavior normal.        Thought Content: Thought content normal.        Judgment: Judgment normal.          Patient has been counseled extensively about nutrition and exercise as well as the importance of adherence with medications and regular follow-up. The patient was given clear instructions to go to ER or return to medical center if symptoms don't improve, worsen or new problems develop. The patient verbalized understanding.   Follow-up: Return in about 3 months (around 09/02/2023) for a1c.   Claiborne Rigg, FNP-BC [facility] and [facility] [place], [place] [phone]   06/02/2023, 8:50 PM

## 2023-06-02 NOTE — Patient Instructions (Signed)
 [facility] [address]  [phone]

## 2023-06-03 ENCOUNTER — Other Ambulatory Visit (HOSPITAL_COMMUNITY): Payer: Self-pay

## 2023-06-03 ENCOUNTER — Encounter: Payer: Self-pay | Admitting: Nurse Practitioner

## 2023-06-04 LAB — MICROALBUMIN / CREATININE URINE RATIO
Creatinine, Urine: 75.9 mg/dL
Microalb/Creat Ratio: 8 mg/g{creat} (ref 0–29)
Microalbumin, Urine: 6.2 ug/mL

## 2023-06-04 LAB — URINALYSIS, COMPLETE
Bilirubin, UA: NEGATIVE
Glucose, UA: NEGATIVE
Ketones, UA: NEGATIVE
Nitrite, UA: POSITIVE — AB
Protein,UA: NEGATIVE
RBC, UA: NEGATIVE
Specific Gravity, UA: 1.018 (ref 1.005–1.030)
Urobilinogen, Ur: 1 mg/dL (ref 0.2–1.0)
pH, UA: 7.5 (ref 5.0–7.5)

## 2023-06-04 LAB — MICROSCOPIC EXAMINATION: Casts: NONE SEEN /LPF

## 2023-06-04 LAB — CYTOLOGY - PAP
Comment: NEGATIVE
Diagnosis: NEGATIVE
High risk HPV: NEGATIVE

## 2023-06-04 LAB — CERVICOVAGINAL ANCILLARY ONLY
Bacterial Vaginitis (gardnerella): POSITIVE — AB
Candida Glabrata: NEGATIVE
Candida Vaginitis: NEGATIVE
Chlamydia: NEGATIVE
Comment: NEGATIVE
Comment: NEGATIVE
Comment: NEGATIVE
Comment: NEGATIVE
Comment: NEGATIVE
Comment: NORMAL
Neisseria Gonorrhea: NEGATIVE
Trichomonas: POSITIVE — AB

## 2023-06-05 ENCOUNTER — Other Ambulatory Visit: Payer: Self-pay

## 2023-06-05 ENCOUNTER — Other Ambulatory Visit: Payer: Self-pay | Admitting: Nurse Practitioner

## 2023-06-05 ENCOUNTER — Encounter: Payer: Self-pay | Admitting: Pharmacist

## 2023-06-05 ENCOUNTER — Other Ambulatory Visit (HOSPITAL_COMMUNITY): Payer: Self-pay

## 2023-06-05 DIAGNOSIS — B9689 Other specified bacterial agents as the cause of diseases classified elsewhere: Secondary | ICD-10-CM

## 2023-06-05 DIAGNOSIS — N3000 Acute cystitis without hematuria: Secondary | ICD-10-CM

## 2023-06-05 DIAGNOSIS — A599 Trichomoniasis, unspecified: Secondary | ICD-10-CM

## 2023-06-05 MED ORDER — METRONIDAZOLE 500 MG PO TABS
500.0000 mg | ORAL_TABLET | Freq: Two times a day (BID) | ORAL | 0 refills | Status: DC
  Filled 2023-06-05 (×3): qty 14, 7d supply, fill #0

## 2023-06-05 MED ORDER — NITROFURANTOIN MONOHYD MACRO 100 MG PO CAPS
100.0000 mg | ORAL_CAPSULE | Freq: Two times a day (BID) | ORAL | 0 refills | Status: AC
Start: 2023-06-05 — End: 2023-06-10
  Filled 2023-06-05 (×3): qty 10, 5d supply, fill #0

## 2023-06-05 NOTE — Telephone Encounter (Signed)
Call made to patient.  

## 2023-06-05 NOTE — Telephone Encounter (Signed)
 Results sent please call patient. Thanks!

## 2023-06-06 ENCOUNTER — Other Ambulatory Visit: Payer: Self-pay | Admitting: Nurse Practitioner

## 2023-06-06 ENCOUNTER — Other Ambulatory Visit: Payer: Self-pay

## 2023-06-06 DIAGNOSIS — B9689 Other specified bacterial agents as the cause of diseases classified elsewhere: Secondary | ICD-10-CM

## 2023-06-06 DIAGNOSIS — J302 Other seasonal allergic rhinitis: Secondary | ICD-10-CM

## 2023-06-06 DIAGNOSIS — A599 Trichomoniasis, unspecified: Secondary | ICD-10-CM

## 2023-06-06 MED ORDER — METRONIDAZOLE 500 MG PO TABS
2000.0000 mg | ORAL_TABLET | Freq: Once | ORAL | 0 refills | Status: AC
  Filled 2023-06-06 – 2023-08-05 (×2): qty 4, 1d supply, fill #0

## 2023-06-06 MED ORDER — CETIRIZINE HCL 10 MG PO TABS
10.0000 mg | ORAL_TABLET | Freq: Every day | ORAL | 3 refills | Status: DC
  Filled 2023-06-06 (×2): qty 90, 90d supply, fill #0
  Filled 2023-08-05 – 2023-08-23 (×2): qty 90, 90d supply, fill #1
  Filled 2023-12-22: qty 90, 90d supply, fill #2
  Filled 2024-03-12: qty 90, 90d supply, fill #3

## 2023-06-16 ENCOUNTER — Other Ambulatory Visit (INDEPENDENT_AMBULATORY_CARE_PROVIDER_SITE_OTHER): Payer: Self-pay

## 2023-06-16 ENCOUNTER — Other Ambulatory Visit: Payer: Self-pay

## 2023-06-16 ENCOUNTER — Ambulatory Visit: Admitting: Orthopedic Surgery

## 2023-06-16 ENCOUNTER — Encounter: Payer: Self-pay | Admitting: Orthopedic Surgery

## 2023-06-16 DIAGNOSIS — M545 Low back pain, unspecified: Secondary | ICD-10-CM | POA: Diagnosis not present

## 2023-06-16 DIAGNOSIS — M19011 Primary osteoarthritis, right shoulder: Secondary | ICD-10-CM | POA: Diagnosis not present

## 2023-06-16 DIAGNOSIS — M25511 Pain in right shoulder: Secondary | ICD-10-CM

## 2023-06-16 DIAGNOSIS — M25562 Pain in left knee: Secondary | ICD-10-CM

## 2023-06-16 DIAGNOSIS — G8929 Other chronic pain: Secondary | ICD-10-CM

## 2023-06-16 DIAGNOSIS — M25561 Pain in right knee: Secondary | ICD-10-CM

## 2023-06-16 MED ORDER — LIDOCAINE HCL 1 % IJ SOLN
3.0000 mL | INTRAMUSCULAR | Status: AC | PRN
  Administered 2023-06-16: 3 mL

## 2023-06-16 MED ORDER — METHYLPREDNISOLONE ACETATE 40 MG/ML IJ SUSP
13.3300 mg | INTRAMUSCULAR | Status: AC | PRN
  Administered 2023-06-16: 13.33 mg via INTRA_ARTICULAR

## 2023-06-16 MED ORDER — BUPIVACAINE HCL 0.25 % IJ SOLN
0.6600 mL | INTRAMUSCULAR | Status: AC | PRN
  Administered 2023-06-16: .66 mL via INTRA_ARTICULAR

## 2023-06-16 NOTE — Progress Notes (Signed)
 Office Visit Note   Patient: Amanda Tran           Date of Birth: 1965-11-13           MRN: [id number] Visit Date: 06/16/2023 Requested by: Claiborne Rigg, NP [address] [place] [address] [place],  [place] [address] PCP: Claiborne Rigg, NP  Subjective: Chief Complaint  Patient presents with   Right Shoulder - Pain    HPI: Amanda Tran is a 58 y.o. female who presents to the office reporting  Right shoulder pain since December 2024.  Denies any history of injury.  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  She is right-hand dominant.  Pain does wake her from sleep at night.  She cannot sleep on that side.  The pain is primarily superior in the shoulder with some radiation into the superior trapezial region.  No symptoms below the elbow.  Denies any numbness and tingling.  Takes Tylenol with some relief.  Does have a history of DVT on that left lower extremity.  Patient also reports bilateral lower extremity muscle type pain left worse than right.  Has some difficulty with standing and stiffness.  Has a lot of low back pain as well.  Has been going on for 2 weeks.  Has not recently started taking statins.  She has not had any periods of relative immobilization.  Denies any swelling in the legs.              ROS: All systems reviewed are negative as they relate to the chief complaint within the history of present illness.  Patient denies fevers or chills.  Assessment & Plan: Visit Diagnoses:  1. Right shoulder pain, unspecified chronicity   2. Pain in both knees, unspecified chronicity   3. 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  Radiographs do show some bony resorption in the superior aspect of the [facility] joint.  Very tender to palpation there.  Ultrasound-guided injection into the right  [facility] joint is performed today.  During the anesthetic phase of the injection the patient did get substantial relief.  Plan is 6-week return with decision for or against a repeat injection and/or MRI scanning at that time.  Regarding bilateral lower extremity pain her knee radiographs are normal and she has palpable pedal pulses and no nerve root tension signs.  Hips also look intact on radiographs of the lumbar spine.  No calf tenderness negative Homans with no asymmetric swelling.  Plan at this time is possible lumbar spine MRI in 6 weeks if she is not any better.  Unlikely for DVT but that is a consideration and we will request ultrasound bilateral lower extremities to rule out DVT.  I think it is more likely she may have some type of stenosis in her back even though her plain radiographs look pretty clean.  Follow-Up Instructions: No follow-ups on file.   Orders:  Orders Placed This Encounter  Procedures   XR Shoulder Right   XR KNEE 3 VIEW RIGHT   XR KNEE 3 VIEW LEFT   XR Lumbar Spine 2-3 Views   US Guided Needle Placement - No Linked Charges   No orders of the defined types were placed in this encounter.     Procedures: Medium Joint Inj: R  acromioclavicular on 06/16/2023 6:13 PM Indications: pain and diagnostic evaluation Details: 25 G 1.5 in needle, ultrasound-guided superior approach Medications: 13.33 mg methylPREDNISolone acetate 40 MG/ML; 0.66 mL bupivacaine 0.25 %; 3 mL lidocaine 1 % Outcome: tolerated well, no immediate complications Procedure, treatment alternatives, risks and benefits explained, specific risks discussed. Consent was given by the patient. Immediately prior to procedure a time out was called to verify the correct patient, procedure, equipment, support staff and site/side marked as required. Patient was prepped and draped in the usual sterile fashion.       Clinical Data: No additional findings.  Objective: Vital Signs: LMP  (LMP Unknown)   Physical Exam:   Constitutional: Patient appears well-developed HEENT:  Head: Normocephalic Eyes:EOM are normal Neck: Normal range of motion Cardiovascular: Normal rate Pulmonary/chest: Effort normal Neurologic: Patient is alert Skin: Skin is warm Psychiatric: Patient has normal mood and affect  Ortho Exam:Patient has bilateral 5 out of 5 grip EPL FPL interosseous wrist flexion wrist extension bicep triceps and deltoid strength.  Bilateral palpable radial pulses and no paresthesias C5-T1 in either arm.  Neck range of motion flexion chin to chest with extension approximately 50 degrees with approximately 50 degrees of rotation bilaterally.  No masses lymphadenopathy or skin changes around the neck or shoulder girdle region bilaterally  Right shoulder exam demonstrates significant tenderness to palpation of the [facility] joint right versus left.  Passive range of motion is 65/105/170 bilaterally.  Good rotator cuff strength infraspinatus supraspinatus and subscap muscle testing.  No masses lymphadenopathy or skin changes noted in that shoulder girdle region.  Radial pulse intact bilaterally.  No coarse grinding or crepitus with internal/external rotation of that right arm.  Does have positive speeds testing.  That is on the right-hand side.  Bilateral lower extremities demonstrate palpable pedal pulses with 5 out of 5 ankle dorsiflexion plantarflexion quad hamstring strength.  No groin pain with internal/external Tatian of the leg.  Negative Homans no calf tenderness bilaterally.  Passive ankle range of motion intact.  Specialty Comments:  No specialty comments available.  Imaging: XR KNEE 3 VIEW RIGHT Result Date: 06/16/2023 AP lateral merchant radiographs of the right knee are reviewed.  No acute fracture.  No dislocation.  Alignment normal.  No significant degenerative changes in the medial lateral or patellofemoral compartments.   XR KNEE 3 VIEW LEFT Result Date: 06/16/2023 AP lateral and merchant radiographs  of the left knee are reviewed.  No acute fracture.  No dislocation.  Alignment normal.  No significant degenerative changes in the medial lateral or patellofemoral compartments   XR Shoulder Right Result Date: 06/16/2023 AP axillary outlet radiographs right shoulder reviewed.  Acromiohumeral distance maintained glenohumeral joint intact with no arthritis no acute fracture AC joint osteolysis is present with some superior spurring and cystic changes noted.  XR Lumbar Spine 2-3 Views Result Date: 06/16/2023 AP lateral radiographs lumbar spine reviewed.  Minimal facet and degenerative disc disease between the vertebral bodies.  No compression fracture or spondylolisthesis.  US Guided Needle Placement - No Linked Charges Result Date: 06/16/2023 Ultrasound imaging demonstrates needle placement into the [facility] joint with injection of fluid into the joint and no complicating features.  Right AC joint    PMFS History: Patient Active Problem List   Diagnosis Date Noted   Diabetes mellitus (HCC) 04/23/2023   Class 2 severe obesity with serious comorbidity and body mass index (BMI) of 38.0 to 38.9 in adult [facility]) 04/23/2023   Diabetic polyneuropathy associated with type 2 diabetes mellitus (  HCC) 08/22/2022   Constipation 09/22/2019   Rectal bleeding 09/22/2019   Screening breast examination 05/04/2019   Shortness of breath 07/02/2016   Routine screening for STI (sexually transmitted infection) 07/02/2016   Vaginal irritation 10/06/2015   Boil 10/06/2015   HTN (hypertension) 01/09/2015   Vitamin D insufficiency 12/21/2014   Fatigue 12/21/2014   Allergic rhinitis 12/21/2014   Family history of diabetes mellitus (DM) 03/23/2014   Iron deficiency anemia 03/18/2012   Nicotine dependence 03/18/2012   Past Medical History:  Diagnosis Date   Anemia    Anxiety    Diabetes (HCC)    Diabetes mellitus (HCC) 04/23/2023   DVT (deep venous thrombosis) (HCC) 03/11/2012   Left popliteal    Hyperlipidemia     Hypertension    Vitamin D deficiency     Family History  Problem Relation Age of Onset   Kidney failure Mother    Hypertension Father    Diabetes Father    Diabetes Sister    Colon cancer Neg Hx    Esophageal cancer Neg Hx    Stomach cancer Neg Hx    Rectal cancer Neg Hx    Breast cancer Neg Hx    Colon polyps Neg Hx     Past Surgical History:  Procedure Laterality Date   CESAREAN SECTION     1990, 1993.1994, 1996   COLONOSCOPY     TUBAL LIGATION     Social History   Occupational History   Not on file  Tobacco Use   Smoking status: Former    Current packs/day: 0.00    Average packs/day: 0.5 packs/day for 25.0 years (12.5 ttl pk-yrs)    Types: Cigarettes    Start date: 09/21/1992    Quit date: 09/21/2017    Years since quitting: 5.7   Smokeless tobacco: Never  Vaping Use   Vaping status: Never Used  Substance and Sexual Activity   Alcohol use: Yes    Comment: occasional   Drug use: No   Sexual activity: Not Currently    Birth control/protection: Condom, Surgical    Comment: 1st intercourse 58 yo-More than 5 partners-BTL

## 2023-06-17 ENCOUNTER — Telehealth: Payer: Self-pay | Admitting: Orthopedic Surgery

## 2023-06-17 ENCOUNTER — Other Ambulatory Visit: Payer: Self-pay

## 2023-06-17 ENCOUNTER — Other Ambulatory Visit (HOSPITAL_COMMUNITY): Payer: Self-pay

## 2023-06-17 ENCOUNTER — Other Ambulatory Visit: Payer: Self-pay | Admitting: Orthopedic Surgery

## 2023-06-17 DIAGNOSIS — M25561 Pain in right knee: Secondary | ICD-10-CM

## 2023-06-17 DIAGNOSIS — M79605 Pain in left leg: Secondary | ICD-10-CM

## 2023-06-17 DIAGNOSIS — M79604 Pain in right leg: Secondary | ICD-10-CM

## 2023-06-17 MED ORDER — ACETAMINOPHEN-CODEINE 300-30 MG PO TABS
1.0000 | ORAL_TABLET | Freq: Three times a day (TID) | ORAL | 0 refills | Status: DC | PRN
  Filled 2023-06-17: qty 30, 10d supply, fill #0
  Filled 2023-06-17 (×3): qty 21, 7d supply, fill #0
  Filled 2023-08-05: qty 9, 3d supply, fill #0
  Filled 2023-08-05: qty 9, 3d supply, fill #1

## 2023-06-17 NOTE — Telephone Encounter (Signed)
 Pt states she's waiting on a pain medication from yesterday 06/16/23 that Dr. August Saucer [facility] and the pharmacy states the Rx is not there.  Pt states she is in severe pain and hurting while trying to work. Pt request a call back to let her know when and if the Rx is called in to the pharmacy.

## 2023-06-17 NOTE — Telephone Encounter (Signed)
Med sent thx

## 2023-06-17 NOTE — Progress Notes (Signed)
 Order sent to [facility] to schedule

## 2023-06-17 NOTE — Telephone Encounter (Signed)
 Patient called and said the pharmacy is waiting for the doctor to sign off on the prescription. [phone]

## 2023-06-18 NOTE — Telephone Encounter (Signed)
 He took care of this yesterday

## 2023-06-19 ENCOUNTER — Other Ambulatory Visit: Payer: Self-pay

## 2023-06-20 ENCOUNTER — Ambulatory Visit (HOSPITAL_COMMUNITY)
Admission: RE | Admit: 2023-06-20 | Discharge: 2023-06-20 | Disposition: A | Discharge status: Home / Self Care | Source: Ambulatory Visit | Attending: Orthopedic Surgery | Admitting: Orthopedic Surgery

## 2023-06-20 DIAGNOSIS — M25561 Pain in right knee: Secondary | ICD-10-CM | POA: Diagnosis not present

## 2023-06-20 DIAGNOSIS — M25562 Pain in left knee: Secondary | ICD-10-CM | POA: Diagnosis not present

## 2023-06-20 DIAGNOSIS — M79605 Pain in left leg: Secondary | ICD-10-CM | POA: Diagnosis not present

## 2023-06-20 DIAGNOSIS — M79604 Pain in right leg: Secondary | ICD-10-CM | POA: Diagnosis not present

## 2023-06-23 ENCOUNTER — Other Ambulatory Visit: Payer: Self-pay

## 2023-06-23 ENCOUNTER — Other Ambulatory Visit (HOSPITAL_COMMUNITY): Payer: Self-pay

## 2023-06-26 ENCOUNTER — Telehealth: Payer: Self-pay | Admitting: Orthopedic Surgery

## 2023-06-26 NOTE — Telephone Encounter (Signed)
 Pt called asking for a letter of restrictions for her employer. She states she lift pt's and the weight is too much and she need letter to include weight limitation on it. Please call pt when ready for pick up at [phone].

## 2023-06-30 ENCOUNTER — Telehealth: Payer: Self-pay | Admitting: Orthopedic Surgery

## 2023-06-30 ENCOUNTER — Encounter: Payer: Self-pay | Admitting: Orthopedic Surgery

## 2023-06-30 NOTE — Telephone Encounter (Signed)
 No lifting more than 30 pounds with the affected shoulder for 6 weeks until her return office visit.  Thanks

## 2023-06-30 NOTE — Telephone Encounter (Signed)
 No lifting more than 30 pounds for 6 weeks affected shoulder.  We will reevaluate at that time.  Thanks

## 2023-06-30 NOTE — Telephone Encounter (Signed)
 Pt called asking for a letter of restrictions for her employer. She states she lift pt's and the weight is too much and she need letter to include weight limitation on it. Please call pt when ready for pick up at [phone].

## 2023-07-01 ENCOUNTER — Encounter (HOSPITAL_BASED_OUTPATIENT_CLINIC_OR_DEPARTMENT_OTHER): Payer: Self-pay

## 2023-07-04 ENCOUNTER — Other Ambulatory Visit (INDEPENDENT_AMBULATORY_CARE_PROVIDER_SITE_OTHER): Payer: Self-pay

## 2023-07-04 ENCOUNTER — Other Ambulatory Visit: Payer: Self-pay

## 2023-07-04 ENCOUNTER — Ambulatory Visit: Admitting: Surgical

## 2023-07-04 DIAGNOSIS — M25532 Pain in left wrist: Secondary | ICD-10-CM

## 2023-07-04 MED ORDER — MELOXICAM 15 MG PO TABS
15.0000 mg | ORAL_TABLET | Freq: Every day | ORAL | 0 refills | Status: DC
Start: 2023-07-04 — End: 2023-08-05
  Filled 2023-07-04 (×2): qty 21, 21d supply, fill #0

## 2023-07-07 ENCOUNTER — Encounter: Payer: Self-pay | Admitting: Surgical

## 2023-07-07 NOTE — Progress Notes (Signed)
 Office Visit Note   Patient: Amanda Tran           Date of Birth: 05-Apr-1965           MRN: [id number] Visit Date: 07/04/2023 Requested by: Claiborne Rigg, NP [address] [place] [address] [place],  [place] [address] PCP: Claiborne Rigg, NP  Subjective: Chief Complaint  Patient presents with   Left Wrist - Pain    HPI: Amanda Tran is a 58 y.o. female who presents to the office reporting left wrist pain.  Patient states that she has had pain for about a week without any history of injury.  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  She has pain primarily on the dorsal aspect of the left wrist with pain is worse with any twisting, gripping, forearm rotation.  She has fairly diffuse pain throughout the dorsum of the wrist without any significant radiation.  No numbness or tingling.  No prior injury or surgery to the wrist.  No locking symptoms or any significant mechanical symptoms.  She is right-hand dominant.  She has been taking Advil with some relief..                ROS: All systems reviewed are negative as they relate to the chief complaint within the history of present illness.  Patient denies fevers or chills.  Assessment & Plan: Visit Diagnoses:  1. Pain in left wrist     Plan: Patient is a 58 year old female who presents for evaluation of left wrist pain.  Pain has been ongoing for about a week.  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  Radiographs show minimal degenerative changes and ultrasound evaluation today does demonstrate maximal point of tenderness over the intersection of 2nd and 3rd dorsal compartments with small amount of hypoechoic fluid noted in the second extensor compartment tendon sheath.  We discussed options available to patient.  Initially she would like to try injection but after further discussion she would like to hold off on  this and try wrist brace and topical Voltaren along with meloxicam instead of Advil.  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  Follow-Up Instructions: Return in about 4 weeks (around 08/01/2023).   Orders:  Orders Placed This Encounter  Procedures   XR Wrist Complete Left   US Guided Needle Placement - No Linked Charges   Meds ordered this encounter  Medications   meloxicam (MOBIC) 15 MG tablet    Sig: Take 1 tablet (15 mg total) by mouth daily.    Dispense:  21 tablet    Refill:  0      Procedures: No procedures performed   Clinical Data: No additional findings.  Objective: Vital Signs: LMP  (LMP Unknown)   Physical Exam:  Constitutional: Patient appears well-developed HEENT:  Head: Normocephalic Eyes:EOM are normal Neck: Normal range of motion Cardiovascular: Normal rate Pulmonary/chest: Effort normal Neurologic: Patient is alert Skin: Skin is warm Psychiatric: Patient has normal mood and affect  Ortho Exam: Ortho exam demonstrates left wrist with maximal tenderness over the dorsal aspect of the distal forearm around the second extensor compartment location.  She has intact EPL, FPL, finger abduction, grip strength testing without reproduction of pain.  No significant tenderness  over the radiocarpal joint or first [facility] joint.  No significant crepitus with passive motion of the left wrist.  No loss of wrist range of motion compared with contralateral side.  Negative Tinel sign.  Palpable radial pulse of the left upper extremity.  Specialty Comments:  No specialty comments available.  Imaging: No results found.   PMFS History: Patient Active Problem List   Diagnosis Date Noted   Diabetes mellitus (HCC) 04/23/2023   Class 2 severe obesity with serious comorbidity and body mass index (BMI) of 38.0 to 38.9 in adult [facility]) 04/23/2023    Diabetic polyneuropathy associated with type 2 diabetes mellitus (HCC) 08/22/2022   Constipation 09/22/2019   Rectal bleeding 09/22/2019   Screening breast examination 05/04/2019   Shortness of breath 07/02/2016   Routine screening for STI (sexually transmitted infection) 07/02/2016   Vaginal irritation 10/06/2015   Boil 10/06/2015   HTN (hypertension) 01/09/2015   Vitamin D insufficiency 12/21/2014   Fatigue 12/21/2014   Allergic rhinitis 12/21/2014   Family history of diabetes mellitus (DM) 03/23/2014   Iron deficiency anemia 03/18/2012   Nicotine dependence 03/18/2012   Past Medical History:  Diagnosis Date   Anemia    Anxiety    Diabetes (HCC)    Diabetes mellitus (HCC) 04/23/2023   DVT (deep venous thrombosis) (HCC) 03/11/2012   Left popliteal    Hyperlipidemia    Hypertension    Vitamin D deficiency     Family History  Problem Relation Age of Onset   Kidney failure Mother    Hypertension Father    Diabetes Father    Diabetes Sister    Colon cancer Neg Hx    Esophageal cancer Neg Hx    Stomach cancer Neg Hx    Rectal cancer Neg Hx    Breast cancer Neg Hx    Colon polyps Neg Hx     Past Surgical History:  Procedure Laterality Date   CESAREAN SECTION     1990, 1993.1994, 1996   COLONOSCOPY     TUBAL LIGATION     Social History   Occupational History   Not on file  Tobacco Use   Smoking status: Former    Current packs/day: 0.00    Average packs/day: 0.5 packs/day for 25.0 years (12.5 ttl pk-yrs)    Types: Cigarettes    Start date: 09/21/1992    Quit date: 09/21/2017    Years since quitting: 5.7   Smokeless tobacco: Never  Vaping Use   Vaping status: Never Used  Substance and Sexual Activity   Alcohol use: Yes    Comment: occasional   Drug use: No   Sexual activity: Not Currently    Birth control/protection: Condom, Surgical    Comment: 1st intercourse 58 yo-More than 5 partners-BTL

## 2023-07-18 ENCOUNTER — Encounter: Payer: Self-pay | Admitting: Surgical

## 2023-07-18 ENCOUNTER — Ambulatory Visit: Admitting: Surgical

## 2023-07-18 DIAGNOSIS — M25532 Pain in left wrist: Secondary | ICD-10-CM | POA: Diagnosis not present

## 2023-07-18 NOTE — Progress Notes (Signed)
 Follow-up Office Visit Note   Patient: Amanda Tran  Amanda Tran           Date of Birth: 10/29/1965           MRN: [id number] Visit Date: 07/18/2023 Requested by: Collins Dean, NP [address] [place] [address] [place],  [place] [address] PCP: Collins Dean, NP  Subjective: Chief Complaint  Patient presents with   Left Wrist - Pain, Follow-up    HPI: Amanda  L Tran is a 58 y.o. female who returns to the office for follow-up visit.    Plan at last visit was: Patient is a 58 year old female who presents for evaluation of left wrist pain. Pain has been ongoing for about a week. 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 Radiographs show minimal degenerative changes and ultrasound evaluation today does demonstrate maximal point of tenderness over the intersection of 2nd and 3rd dorsal compartments with small amount of hypoechoic fluid noted in the second extensor compartment tendon sheath. We discussed options available to patient. Initially she would like to try injection but after further discussion she would like to hold off on this and try wrist brace and topical Voltaren along with meloxicam  instead of Advil . 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   Since then, patient notes about 50% relief of symptoms since her last visit on 4/11.  She has been taking meloxicam  with good relief.  She initially wore the splint but has gotten rid of this at this point since it was getting in the way of her job and life and was not very helpful.  Has not gotten Voltaren gel yet but is interested in trying this still.  Her pain on the dorsum of the distal forearm has improved but she still has some pain mostly around the base of the thumb now at this point.              ROS: All systems reviewed are negative as  they relate to the chief complaint within the history of present illness.  Patient denies fevers or chills.  Assessment & Plan: Visit Diagnoses: No diagnosis found.  Plan: Amanda  L Tran is a 58 y.o. female who returns to the office for follow-up visit.  Plan from last visit was noted above in HPI.  They now return with improvement of dorsal forearm pain.  Now most of her pain localizes to the [facility] joint where she has history of arthritis based on radiographs from last visit.  Plan to try continuing meloxicam  and use of Voltaren gel as needed.  If she has no improvement or pain worsens, she is welcome to return and we could try an injection likely into the first [facility] joint.  Follow-up as needed.  Follow-Up Instructions: No follow-ups on file.   Orders:  No orders of the defined types were placed in this encounter.  No orders of the defined types were placed in this encounter.     Procedures: No procedures performed   Clinical Data: No additional findings.  Objective: Vital Signs: LMP  (LMP Unknown)   Physical Exam:  Constitutional: Patient appears well-developed HEENT:  Head: Normocephalic Eyes:EOM are normal Neck: Normal range of motion Cardiovascular: Normal rate Pulmonary/chest: Effort normal Neurologic: Patient is alert Skin: Skin is warm Psychiatric: Patient has normal mood and affect  Ortho Exam: Ortho exam demonstrates no tenderness over the intersection zone with improvement since last visit.  No visible swelling in this location.  She has intact EPL, FPL, finger abduction, pronation/supination.  She has wrist extension without reproduction of significant pain.  She does have maximal tenderness over the first [facility] joint with reproduction of pain with thumb circumduction.  Minimal tenderness over the scaphoid tubercle.  No tenderness over the first dorsal compartment and negative Finkelstein's test.  Specialty Comments:  No specialty comments  available.  Imaging: No results found.   PMFS History: Patient Active Problem List   Diagnosis Date Noted   Diabetes mellitus (HCC) 04/23/2023   Class 2 severe obesity with serious comorbidity and body mass index (BMI) of 38.0 to 38.9 in adult [facility]) 04/23/2023   Diabetic polyneuropathy associated with type 2 diabetes mellitus (HCC) 08/22/2022   Constipation 09/22/2019   Rectal bleeding 09/22/2019   Screening breast examination 05/04/2019   Shortness of breath 07/02/2016   Routine screening for STI (sexually transmitted infection) 07/02/2016   Vaginal irritation 10/06/2015   Boil 10/06/2015   HTN (hypertension) 01/09/2015   Vitamin D  insufficiency 12/21/2014   Fatigue 12/21/2014   Allergic rhinitis 12/21/2014   Family history of diabetes mellitus (DM) 03/23/2014   Iron deficiency anemia 03/18/2012   Nicotine  dependence 03/18/2012   Past Medical History:  Diagnosis Date   Anemia    Anxiety    Diabetes (HCC)    Diabetes mellitus (HCC) 04/23/2023   DVT (deep venous thrombosis) (HCC) 03/11/2012   Left popliteal    Hyperlipidemia    Hypertension    Vitamin D  deficiency     Family History  Problem Relation Age of Onset   Kidney failure Mother    Hypertension Father    Diabetes Father    Diabetes Sister    Colon cancer Neg Hx    Esophageal cancer Neg Hx    Stomach cancer Neg Hx    Rectal cancer Neg Hx    Breast cancer Neg Hx    Colon polyps Neg Hx     Past Surgical History:  Procedure Laterality Date   CESAREAN SECTION     1990, 1993.1994, 1996   COLONOSCOPY     TUBAL LIGATION     Social History   Occupational History   Not on file  Tobacco Use   Smoking status: Former    Current packs/day: 0.00    Average packs/day: 0.5 packs/day for 25.0 years (12.5 ttl pk-yrs)    Types: Cigarettes    Start date: 09/21/1992    Quit date: 09/21/2017    Years since quitting: 5.8   Smokeless tobacco: Never  Vaping Use   Vaping status: Never Used  Substance and Sexual  Activity   Alcohol use: Yes    Comment: occasional   Drug use: No   Sexual activity: Not Currently    Birth control/protection: Condom, Surgical    Comment: 1st intercourse 58 yo-More than 5 partners-BTL

## 2023-07-28 ENCOUNTER — Ambulatory Visit: Admitting: Orthopedic Surgery

## 2023-08-05 ENCOUNTER — Other Ambulatory Visit: Payer: Self-pay | Admitting: Surgical

## 2023-08-05 ENCOUNTER — Other Ambulatory Visit (HOSPITAL_COMMUNITY): Payer: Self-pay

## 2023-08-05 ENCOUNTER — Other Ambulatory Visit: Payer: Self-pay

## 2023-08-06 ENCOUNTER — Other Ambulatory Visit (HOSPITAL_COMMUNITY): Payer: Self-pay

## 2023-08-06 ENCOUNTER — Other Ambulatory Visit: Payer: Self-pay

## 2023-08-06 MED ORDER — MELOXICAM 15 MG PO TABS
15.0000 mg | ORAL_TABLET | Freq: Every day | ORAL | 0 refills | Status: DC
  Filled 2023-08-06 (×2): qty 21, 21d supply, fill #0

## 2023-08-23 ENCOUNTER — Other Ambulatory Visit: Payer: Self-pay | Admitting: Surgical

## 2023-08-23 ENCOUNTER — Other Ambulatory Visit: Payer: Self-pay | Admitting: Nurse Practitioner

## 2023-08-23 ENCOUNTER — Other Ambulatory Visit (HOSPITAL_COMMUNITY): Payer: Self-pay

## 2023-08-23 ENCOUNTER — Other Ambulatory Visit: Payer: Self-pay | Admitting: Orthopedic Surgery

## 2023-08-23 DIAGNOSIS — E1165 Type 2 diabetes mellitus with hyperglycemia: Secondary | ICD-10-CM

## 2023-08-25 ENCOUNTER — Other Ambulatory Visit: Payer: Self-pay

## 2023-08-25 ENCOUNTER — Other Ambulatory Visit (HOSPITAL_COMMUNITY): Payer: Self-pay

## 2023-08-25 MED ORDER — ACETAMINOPHEN-CODEINE 300-30 MG PO TABS
1.0000 | ORAL_TABLET | Freq: Three times a day (TID) | ORAL | 0 refills | Status: DC | PRN
  Filled 2023-08-25: qty 30, 10d supply, fill #0

## 2023-08-25 MED ORDER — OZEMPIC (2 MG/DOSE) 8 MG/3ML ~~LOC~~ SOPN
2.0000 mg | PEN_INJECTOR | SUBCUTANEOUS | 0 refills | Status: DC
  Filled 2023-08-25 – 2023-08-30 (×2): qty 3, 28d supply, fill #0

## 2023-08-25 MED ORDER — TRIAMCINOLONE ACETONIDE 0.1 % EX CREA
1.0000 | TOPICAL_CREAM | Freq: Every day | CUTANEOUS | 0 refills | Status: AC
  Filled 2023-08-25: qty 45, 30d supply, fill #0
  Filled 2023-09-03: qty 45, 15d supply, fill #0

## 2023-08-25 MED ORDER — MELOXICAM 15 MG PO TABS
15.0000 mg | ORAL_TABLET | Freq: Every day | ORAL | 0 refills | Status: DC
  Filled 2023-08-25: qty 21, 21d supply, fill #0

## 2023-08-26 ENCOUNTER — Other Ambulatory Visit: Payer: Self-pay

## 2023-08-30 ENCOUNTER — Other Ambulatory Visit (HOSPITAL_BASED_OUTPATIENT_CLINIC_OR_DEPARTMENT_OTHER): Payer: Self-pay

## 2023-08-30 ENCOUNTER — Other Ambulatory Visit: Payer: Self-pay | Admitting: Nurse Practitioner

## 2023-08-30 DIAGNOSIS — R053 Chronic cough: Secondary | ICD-10-CM

## 2023-09-01 ENCOUNTER — Other Ambulatory Visit: Payer: Self-pay

## 2023-09-02 ENCOUNTER — Encounter: Payer: Self-pay | Admitting: Nurse Practitioner

## 2023-09-02 ENCOUNTER — Ambulatory Visit: Attending: Nurse Practitioner | Admitting: Nurse Practitioner

## 2023-09-02 VITALS — BP 119/82 | HR 91 | Resp 20 | Ht 61.0 in | Wt 211.0 lb

## 2023-09-02 DIAGNOSIS — D582 Other hemoglobinopathies: Secondary | ICD-10-CM | POA: Diagnosis not present

## 2023-09-02 DIAGNOSIS — I10 Essential (primary) hypertension: Secondary | ICD-10-CM

## 2023-09-02 DIAGNOSIS — E782 Mixed hyperlipidemia: Secondary | ICD-10-CM

## 2023-09-02 DIAGNOSIS — R053 Chronic cough: Secondary | ICD-10-CM

## 2023-09-02 DIAGNOSIS — Z7985 Long-term (current) use of injectable non-insulin antidiabetic drugs: Secondary | ICD-10-CM | POA: Diagnosis not present

## 2023-09-02 DIAGNOSIS — E1165 Type 2 diabetes mellitus with hyperglycemia: Secondary | ICD-10-CM | POA: Diagnosis not present

## 2023-09-02 DIAGNOSIS — U099 Post covid-19 condition, unspecified: Secondary | ICD-10-CM

## 2023-09-02 DIAGNOSIS — D72829 Elevated white blood cell count, unspecified: Secondary | ICD-10-CM | POA: Diagnosis not present

## 2023-09-02 LAB — POCT GLYCOSYLATED HEMOGLOBIN (HGB A1C): HbA1c, POC (controlled diabetic range): 5.8 % (ref 0.0–7.0)

## 2023-09-02 MED ORDER — LISINOPRIL 10 MG PO TABS
10.0000 mg | ORAL_TABLET | Freq: Every day | ORAL | 1 refills | Status: DC
  Filled 2023-09-02: qty 90, 90d supply, fill #0
  Filled 2023-09-03: qty 30, 30d supply, fill #0
  Filled 2023-09-25 – 2023-09-27 (×2): qty 30, 30d supply, fill #1
  Filled 2023-10-20: qty 30, 30d supply, fill #2
  Filled 2023-12-18: qty 30, 30d supply, fill #3
  Filled 2023-12-22: qty 30, 30d supply, fill #0
  Filled 2024-03-12: qty 30, 30d supply, fill #1

## 2023-09-02 MED ORDER — OZEMPIC (2 MG/DOSE) 8 MG/3ML ~~LOC~~ SOPN
2.0000 mg | PEN_INJECTOR | SUBCUTANEOUS | 6 refills | Status: AC
  Filled 2023-09-02 – 2023-09-03 (×2): qty 3, 28d supply, fill #0
  Filled 2023-09-22: qty 9, 84d supply, fill #0
  Filled 2023-12-18: qty 9, 84d supply, fill #1
  Filled 2023-12-22: qty 12, 112d supply, fill #1

## 2023-09-02 MED ORDER — AMLODIPINE BESYLATE 10 MG PO TABS
10.0000 mg | ORAL_TABLET | Freq: Every day | ORAL | 1 refills | Status: DC
  Filled 2023-09-02: qty 90, 90d supply, fill #0
  Filled 2023-09-03: qty 30, 30d supply, fill #0
  Filled 2023-09-25 – 2023-09-27 (×2): qty 30, 30d supply, fill #1
  Filled 2023-12-18: qty 30, 30d supply, fill #2
  Filled 2023-12-22: qty 30, 30d supply, fill #0
  Filled 2024-03-12: qty 30, 30d supply, fill #1

## 2023-09-02 MED ORDER — ALBUTEROL SULFATE HFA 108 (90 BASE) MCG/ACT IN AERS
2.0000 | INHALATION_SPRAY | Freq: Four times a day (QID) | RESPIRATORY_TRACT | 1 refills | Status: AC | PRN
  Filled 2023-09-02 – 2023-09-03 (×2): qty 6.7, 25d supply, fill #0
  Filled 2024-04-07: qty 6.7, 25d supply, fill #1

## 2023-09-02 MED ORDER — ATORVASTATIN CALCIUM 20 MG PO TABS
20.0000 mg | ORAL_TABLET | Freq: Every day | ORAL | 2 refills | Status: AC
Start: 2023-09-02 — End: ?
  Filled 2023-09-02: qty 90, 90d supply, fill #0
  Filled 2023-09-03: qty 30, 30d supply, fill #0
  Filled 2023-09-25 – 2023-09-27 (×2): qty 30, 30d supply, fill #1
  Filled 2023-12-18: qty 30, 30d supply, fill #2
  Filled 2023-12-22: qty 30, 30d supply, fill #0
  Filled 2024-03-12: qty 30, 30d supply, fill #1

## 2023-09-02 NOTE — Progress Notes (Signed)
 Assessment & Plan:   Amanda Tran  was seen today for diabetes.  Diagnoses and all orders for this visit:  Primary hypertension -     amLODipine  (NORVASC ) 10 MG tablet; Take 1 tablet (10 mg total) by mouth daily. -     lisinopril  (ZESTRIL ) 10 MG tablet; Take 1 tablet (10 mg total) by mouth daily. Continue all antihypertensives as prescribed.  Reminded to bring in blood pressure log for follow  up appointment.  RECOMMENDATIONS: DASH/Mediterranean Diets are healthier choices for HTN.    Type 2 diabetes mellitus with hyperglycemia, without long-term current use of insulin (HCC) -     POCT glycosylated hemoglobin (Hb A1C) -     CMP14+EGFR -     lisinopril  (ZESTRIL ) 10 MG tablet; Take 1 tablet (10 mg total) by mouth daily. -     Semaglutide , 2 MG/DOSE, (OZEMPIC , 2 MG/DOSE,) 8 MG/3ML SOPN; Inject 2 mg as directed once a week. -     Ambulatory referral to Ophthalmology Continue blood sugar control as discussed in office today, low carbohydrate diet, and regular physical exercise as tolerated, 150 minutes per week (30 min each day, 5 days per week, or 50 min 3 days per week). Keep blood sugar logs with fasting goal of 90-130 mg/dl, post prandial (after you eat) less than 180.  For Hypoglycemia: BS <60 and Hyperglycemia BS >400; contact the clinic ASAP. Annual eye exams and foot exams are recommended.   Elevated hemoglobin (HCC) -     CBC with Differential  Post-COVID chronic cough -     albuterol  (VENTOLIN  HFA) 108 (90 Base) MCG/ACT inhaler; Inhale 2 puffs into the lungs every 6 (six) hours as needed for wheezing or shortness of breath.  Mixed hyperlipidemia -     atorvastatin  (LIPITOR) 20 MG tablet; Take 1 tablet (20 mg total) by mouth daily. INSTRUCTIONS: Work on a low fat, heart healthy diet and participate in regular aerobic exercise program by working out at least 150 minutes per week; 5 days a week-30 minutes per day. Avoid red meat/beef/steak,  fried foods. junk foods, sodas, sugary  drinks, unhealthy snacking, alcohol and smoking.  Drink at least 80 oz of water per day and monitor your carbohydrate intake daily.      Patient has been counseled on age-appropriate routine health concerns for screening and prevention. These are reviewed and up-to-date. Referrals have been placed accordingly. Immunizations are up-to-date or declined.    Subjective:   Chief Complaint  Patient presents with   Diabetes    Amanda Tran  Amanda Tran 58 y.o. female presents to office today for follow up to DM  She has a past medical history of Anemia, Anxiety, DM 2, DVT (03/11/2012), Hyperlipidemia, Hypertension, and Vitamin D  deficiency.    HTN Blood pressure is well controlled. She is currently taking amlodipine  10 mg daily and lisinopril  10 mg daily as prescribed.  BP Readings from Last 3 Encounters:  09/02/23 119/82  06/02/23 (!) 142/90  05/22/23 121/87     DM2 A1c is at goal of <6.5. She is currently prescribed ozempic  2 mg weekly and glimepiride  8 mg daily.  Lab Results  Component Value Date   HGBA1C 5.8 09/02/2023   LDL not at goal with atorvastatin  20 mg daily  Lab Results  Component Value Date   LDLCALC 106 (H) 05/09/2022     She has an upcoming appointment with Dermatology for skin check (moles/skin tags).   Review of Systems  Constitutional:  Negative for fever, malaise/fatigue and weight loss.  HENT: Negative.  Negative for nosebleeds.   Eyes: Negative.  Negative for blurred vision, double vision and photophobia.  Respiratory: Negative.  Negative for cough and shortness of breath.   Cardiovascular: Negative.  Negative for chest pain, palpitations and leg swelling.  Gastrointestinal: Negative.  Negative for heartburn, nausea and vomiting.  Musculoskeletal: Negative.  Negative for myalgias.  Neurological: Negative.  Negative for dizziness, focal weakness, seizures and headaches.  Psychiatric/Behavioral: Negative.  Negative for suicidal ideas.     Past Medical  History:  Diagnosis Date   Anemia    Anxiety    Diabetes (HCC)    Diabetes mellitus (HCC) 04/23/2023   DVT (deep venous thrombosis) (HCC) 03/11/2012   Left popliteal    Hyperlipidemia    Hypertension    Vitamin D  deficiency     Past Surgical History:  Procedure Laterality Date   CESAREAN SECTION     1990, 1993.1994, 1996   COLONOSCOPY     TUBAL LIGATION      Family History  Problem Relation Age of Onset   Kidney failure Mother    Hypertension Father    Diabetes Father    Diabetes Sister    Colon cancer Neg Hx    Esophageal cancer Neg Hx    Stomach cancer Neg Hx    Rectal cancer Neg Hx    Breast cancer Neg Hx    Colon polyps Neg Hx     Social History Reviewed with no changes to be made today.   Outpatient Medications Prior to Visit  Medication Sig Dispense Refill   acetaminophen -codeine  (TYLENOL  #3) 300-30 MG tablet Take 1 tablet by mouth every 8 (eight) hours as needed for moderate pain (pain score 4-6). 30 tablet 0   Blood Glucose Monitoring Suppl (TRUE METRIX METER) w/Device KIT Use as instructed to heck blood glucose level by fingerstick 3 times per day. 1 kit 0   cetirizine  (ZYRTEC  ALLERGY) 10 MG tablet Take 1 tablet (10 mg total) by mouth daily. 90 tablet 3   glimepiride  (AMARYL ) 4 MG tablet Take 2 tablets (8 mg total) by mouth daily with breakfast. 180 tablet 1   glucose blood (TRUE METRIX BLOOD GLUCOSE TEST) test strip Use as instructed to check blood glucose levels by fingerstick 3 times per day. 100 each 12   meloxicam  (MOBIC ) 15 MG tablet Take 1 tablet (15 mg total) by mouth daily. 21 tablet 0   traZODone  (DESYREL ) 50 MG tablet Take 0.5-1 tablets (25-50 mg total) by mouth at bedtime as needed for sleep. 90 tablet 1   triamcinolone  cream (KENALOG ) 0.1 % Apply 1 application topically daily until resolved. 45 g 0   TRUEplus Lancets 28G MISC Use as instructed to check blood glucose level by fingerstick 3 times per day. 100 each 3   albuterol  (VENTOLIN  HFA) 108 (90  Base) MCG/ACT inhaler Inhale 2 puffs into the lungs every 6 (six) hours as needed for wheezing or shortness of breath. 6.7 g 0   amLODipine  (NORVASC ) 10 MG tablet Take 1 tablet (10 mg total) by mouth daily. 90 tablet 1   atorvastatin  (LIPITOR) 20 MG tablet Take 1 tablet (20 mg total) by mouth daily. 90 tablet 2   lisinopril  (ZESTRIL ) 10 MG tablet Take 1 tablet (10 mg total) by mouth daily. 90 tablet 1   Semaglutide , 2 MG/DOSE, (OZEMPIC , 2 MG/DOSE,) 8 MG/3ML SOPN Inject 2 mg as directed once a week. 3 mL 0   mupirocin  ointment (BACTROBAN ) 2 % Apply 1 Application topically 2 (two)  times daily. (Patient not taking: Reported on 09/02/2023) 60 g 10   No facility-administered medications prior to visit.    No Known Allergies     Objective:    BP 119/82 (BP Location: Left Arm, Patient Position: Sitting, Cuff Size: Normal)   Pulse 91   Resp 20   Ht 5\' 1"  (1.549 m)   Wt 211 lb (95.7 kg)   LMP  (LMP Unknown)   SpO2 100%   BMI 39.87 kg/m  Wt Readings from Last 3 Encounters:  09/02/23 211 lb (95.7 kg)  06/02/23 213 lb 12.8 oz (97 kg)  05/04/23 212 lb 15.4 oz (96.6 kg)    Physical Exam Vitals and nursing note reviewed.  Constitutional:      Appearance: She is well-developed.  HENT:     Head: Normocephalic and atraumatic.  Cardiovascular:     Rate and Rhythm: Normal rate and regular rhythm.     Heart sounds: Normal heart sounds. No murmur heard.    No friction rub. No gallop.  Pulmonary:     Effort: Pulmonary effort is normal. No tachypnea or respiratory distress.     Breath sounds: Normal breath sounds. No decreased breath sounds, wheezing, rhonchi or rales.  Chest:     Chest wall: No tenderness.  Abdominal:     General: Bowel sounds are normal.     Palpations: Abdomen is soft.  Musculoskeletal:        General: Normal range of motion.     Cervical back: Normal range of motion.  Skin:    General: Skin is warm and dry.  Neurological:     Mental Status: She is alert and oriented  to person, place, and time.     Coordination: Coordination normal.  Psychiatric:        Behavior: Behavior normal. Behavior is cooperative.        Thought Content: Thought content normal.        Judgment: Judgment normal.          Patient has been counseled extensively about nutrition and exercise as well as the importance of adherence with medications and regular follow-up. The patient was given clear instructions to go to ER or return to medical center if symptoms don't improve, worsen or new problems develop. The patient verbalized understanding.   Follow-up: Return in about 3 months (around 12/03/2023).   Collins Dean, FNP-BC [facility] [place], [place] [phone]   09/02/2023, 5:27 PM

## 2023-09-03 ENCOUNTER — Other Ambulatory Visit (HOSPITAL_COMMUNITY): Payer: Self-pay

## 2023-09-03 ENCOUNTER — Other Ambulatory Visit: Payer: Self-pay

## 2023-09-03 ENCOUNTER — Encounter: Payer: Self-pay | Admitting: Nurse Practitioner

## 2023-09-03 LAB — CBC WITH DIFFERENTIAL/PLATELET
Basophils Absolute: 0 10*3/uL (ref 0.0–0.2)
Basos: 0 %
EOS (ABSOLUTE): 0.1 10*3/uL (ref 0.0–0.4)
Eos: 3 %
Hematocrit: 45.4 % (ref 34.0–46.6)
Hemoglobin: 14.7 g/dL (ref 11.1–15.9)
Immature Grans (Abs): 0 10*3/uL (ref 0.0–0.1)
Immature Granulocytes: 0 %
Lymphocytes Absolute: 1.9 10*3/uL (ref 0.7–3.1)
Lymphs: 37 %
MCH: 27.5 pg (ref 26.6–33.0)
MCHC: 32.4 g/dL (ref 31.5–35.7)
MCV: 85 fL (ref 79–97)
Monocytes Absolute: 0.3 10*3/uL (ref 0.1–0.9)
Monocytes: 7 %
Neutrophils Absolute: 2.7 10*3/uL (ref 1.4–7.0)
Neutrophils: 53 %
Platelets: 305 10*3/uL (ref 150–450)
RBC: 5.34 x10E6/uL — ABNORMAL HIGH (ref 3.77–5.28)
RDW: 14.1 % (ref 11.7–15.4)
WBC: 5.1 10*3/uL (ref 3.4–10.8)

## 2023-09-03 LAB — CMP14+EGFR
ALT: 24 IU/L (ref 0–32)
AST: 23 IU/L (ref 0–40)
Albumin: 4.3 g/dL (ref 3.8–4.9)
Alkaline Phosphatase: 142 IU/L — ABNORMAL HIGH (ref 44–121)
BUN/Creatinine Ratio: 11 (ref 9–23)
BUN: 12 mg/dL (ref 6–24)
Bilirubin Total: 0.2 mg/dL (ref 0.0–1.2)
CO2: 23 mmol/L (ref 20–29)
Calcium: 9.9 mg/dL (ref 8.7–10.2)
Chloride: 103 mmol/L (ref 96–106)
Creatinine, Ser: 1.05 mg/dL — ABNORMAL HIGH (ref 0.57–1.00)
Globulin, Total: 2.9 g/dL (ref 1.5–4.5)
Glucose: 122 mg/dL — ABNORMAL HIGH (ref 70–99)
Potassium: 4.8 mmol/L (ref 3.5–5.2)
Sodium: 142 mmol/L (ref 134–144)
Total Protein: 7.2 g/dL (ref 6.0–8.5)
eGFR: 62 mL/min/{1.73_m2} (ref >59)

## 2023-09-08 ENCOUNTER — Ambulatory Visit: Payer: Self-pay | Admitting: Nurse Practitioner

## 2023-09-10 ENCOUNTER — Other Ambulatory Visit: Payer: Self-pay

## 2023-09-12 ENCOUNTER — Other Ambulatory Visit: Payer: Self-pay | Admitting: Internal Medicine

## 2023-09-12 DIAGNOSIS — D229 Melanocytic nevi, unspecified: Secondary | ICD-10-CM

## 2023-09-22 ENCOUNTER — Other Ambulatory Visit: Payer: Self-pay

## 2023-09-25 ENCOUNTER — Other Ambulatory Visit: Payer: Self-pay | Admitting: Orthopedic Surgery

## 2023-09-25 ENCOUNTER — Other Ambulatory Visit (HOSPITAL_COMMUNITY): Payer: Self-pay

## 2023-09-25 ENCOUNTER — Other Ambulatory Visit: Payer: Self-pay

## 2023-09-25 MED ORDER — ACETAMINOPHEN-CODEINE 300-30 MG PO TABS
1.0000 | ORAL_TABLET | Freq: Three times a day (TID) | ORAL | 0 refills | Status: DC | PRN
  Filled 2023-09-25: qty 30, 10d supply, fill #0

## 2023-10-14 ENCOUNTER — Other Ambulatory Visit: Payer: Self-pay

## 2023-10-20 ENCOUNTER — Other Ambulatory Visit: Payer: Self-pay | Admitting: Orthopedic Surgery

## 2023-10-20 ENCOUNTER — Other Ambulatory Visit: Payer: Self-pay | Admitting: Nurse Practitioner

## 2023-10-20 ENCOUNTER — Other Ambulatory Visit: Payer: Self-pay | Admitting: Surgical

## 2023-10-20 ENCOUNTER — Other Ambulatory Visit: Payer: Self-pay

## 2023-10-20 ENCOUNTER — Other Ambulatory Visit (HOSPITAL_COMMUNITY): Payer: Self-pay

## 2023-10-20 DIAGNOSIS — E1165 Type 2 diabetes mellitus with hyperglycemia: Secondary | ICD-10-CM

## 2023-10-20 MED ORDER — MELOXICAM 15 MG PO TABS
15.0000 mg | ORAL_TABLET | Freq: Every day | ORAL | 0 refills | Status: DC
  Filled 2023-10-20: qty 21, 21d supply, fill #0

## 2023-10-20 MED ORDER — TRUE METRIX BLOOD GLUCOSE TEST VI STRP
ORAL_STRIP | 12 refills | Status: AC
Start: 2023-10-20 — End: ?
  Filled 2023-10-20 – 2023-12-23 (×2): qty 100, 33d supply, fill #0

## 2023-10-21 ENCOUNTER — Other Ambulatory Visit: Payer: Self-pay

## 2023-10-21 ENCOUNTER — Other Ambulatory Visit (HOSPITAL_COMMUNITY): Payer: Self-pay

## 2023-10-24 ENCOUNTER — Other Ambulatory Visit: Payer: Self-pay

## 2023-10-27 ENCOUNTER — Other Ambulatory Visit (HOSPITAL_COMMUNITY): Payer: Self-pay

## 2023-10-28 ENCOUNTER — Encounter: Payer: Self-pay | Admitting: Nurse Practitioner

## 2023-11-05 ENCOUNTER — Other Ambulatory Visit (HOSPITAL_COMMUNITY): Payer: Self-pay

## 2023-11-05 ENCOUNTER — Encounter (HOSPITAL_COMMUNITY): Payer: Self-pay | Admitting: Pharmacist

## 2023-11-06 ENCOUNTER — Other Ambulatory Visit: Payer: Self-pay

## 2023-11-06 ENCOUNTER — Telehealth

## 2023-11-06 ENCOUNTER — Encounter (HOSPITAL_COMMUNITY): Payer: Self-pay | Admitting: Emergency Medicine

## 2023-11-06 ENCOUNTER — Emergency Department (HOSPITAL_COMMUNITY)
Admission: EM | Admit: 2023-11-06 | Discharge: 2023-11-06 | Disposition: A | Discharge status: Home / Self Care | Attending: Emergency Medicine | Admitting: Emergency Medicine

## 2023-11-06 DIAGNOSIS — L309 Dermatitis, unspecified: Secondary | ICD-10-CM | POA: Diagnosis not present

## 2023-11-06 DIAGNOSIS — L03116 Cellulitis of left lower limb: Secondary | ICD-10-CM | POA: Insufficient documentation

## 2023-11-06 DIAGNOSIS — R21 Rash and other nonspecific skin eruption: Secondary | ICD-10-CM | POA: Diagnosis not present

## 2023-11-06 MED ORDER — CEPHALEXIN 500 MG PO CAPS
500.0000 mg | ORAL_CAPSULE | Freq: Four times a day (QID) | ORAL | 0 refills | Status: DC
  Filled 2023-11-06 – 2023-11-07 (×4): qty 20, 5d supply, fill #0

## 2023-11-06 MED ORDER — PREDNISONE 20 MG PO TABS
60.0000 mg | ORAL_TABLET | Freq: Once | ORAL | Status: AC
  Administered 2023-11-06: 60 mg via ORAL
  Filled 2023-11-06: qty 3

## 2023-11-06 MED ORDER — PREDNISONE 20 MG PO TABS
ORAL_TABLET | ORAL | 0 refills | Status: AC
  Filled 2023-11-06: qty 15, fill #0
  Filled 2023-11-07 (×3): qty 15, 8d supply, fill #0

## 2023-11-06 MED ORDER — CEPHALEXIN 500 MG PO CAPS
1000.0000 mg | ORAL_CAPSULE | Freq: Once | ORAL | Status: AC
  Administered 2023-11-06: 1000 mg via ORAL
  Filled 2023-11-06: qty 2

## 2023-11-06 NOTE — ED Provider Notes (Signed)
 [place] EMERGENCY DEPARTMENT AT [facility] Provider Note   CSN: [id number] Arrival date & time: 11/06/23  1526     Patient presents with: Rash   Amanda  L Tran is a 58 y.o. female.   Pt with c/o rash to bilateral lower legs x 1 month, left being more irritated than right. States started as small, tiny papules. Indicates now right lower leg anteriorly with mild erythema, and pain/tenderness. Notes small patch similar papules to left forearm. Denies other skin lesions or rash. No hx same rash. No hx chronic dermatitis, eczema, or psoriasis. No recent change in meds/new meds. No change in home or personal products. No change in foods. No known outdoor or chemical exposure. No fever or chills. No calf pain or swelling. No chest pain or sob. No throat swelling, irritation or trouble breathing or swallowing.   The history is provided by the patient and medical records.  Rash Associated symptoms: no abdominal pain, no fever, no headaches, no shortness of breath, no sore throat and not vomiting        Prior to Admission medications   Medication Sig Start Date End Date Taking? Authorizing Provider  cephALEXin  (KEFLEX ) 500 MG capsule Take 1 capsule (500 mg total) by mouth 4 (four) times daily. 11/06/23  Yes Bernard Drivers, MD  predniSONE  (DELTASONE ) 20 MG tablet 3 po once a day for 2 days, then 2 po once a day for 3 days, then 1 po once a day for 3 days 11/07/23  Yes Thatcher Doberstein, MD  acetaminophen -codeine  (TYLENOL  #3) 300-30 MG tablet Take 1 tablet by mouth every 8 (eight) hours as needed for moderate pain (pain score 4-6). 09/25/23   Harden Jerona GAILS, MD  albuterol  (VENTOLIN  HFA) 108 [phone] Base) MCG/ACT inhaler Inhale 2 puffs into the lungs every 6 (six) hours as needed for wheezing or shortness of breath. 09/02/23   Fleming, Zelda W, NP  amLODipine  (NORVASC ) 10 MG tablet Take 1 tablet (10 mg total) by mouth daily. 09/02/23   Fleming, Zelda W, NP  atorvastatin  (LIPITOR) 20 MG tablet  Take 1 tablet (20 mg total) by mouth daily. 09/02/23   Fleming, Zelda W, NP  Blood Glucose Monitoring Suppl (TRUE METRIX METER) w/Device KIT Use as instructed to heck blood glucose level by fingerstick 3 times per day. 10/21/22   Fleming, Zelda W, NP  cetirizine  (ZYRTEC  ALLERGY) 10 MG tablet Take 1 tablet (10 mg total) by mouth daily. 06/06/23   Fleming, Zelda W, NP  glimepiride  (AMARYL ) 4 MG tablet Take 2 tablets (8 mg total) by mouth daily with breakfast. 06/02/23   Fleming, Zelda W, NP  glucose blood (TRUE METRIX BLOOD GLUCOSE TEST) test strip Use as instructed to check blood glucose levels by fingerstick 3 times per day. 10/20/23   Fleming, Zelda W, NP  lisinopril  (ZESTRIL ) 10 MG tablet Take 1 tablet (10 mg total) by mouth daily. 09/02/23   Fleming, Zelda W, NP  meloxicam  (MOBIC ) 15 MG tablet Take 1 tablet (15 mg total) by mouth daily. 10/20/23   Magnant, Charles L, PA-C  mupirocin  ointment (BACTROBAN ) 2 % Apply 1 Application topically 2 (two) times daily. Patient not taking: Reported on 09/02/2023 08/22/22   Fleming, Zelda W, NP  Semaglutide , 2 MG/DOSE, (OZEMPIC , 2 MG/DOSE,) 8 MG/3ML SOPN Inject 2 mg as directed once a week. 09/02/23   Fleming, Zelda W, NP  traZODone  (DESYREL ) 50 MG tablet Take 0.5-1 tablets (25-50 mg total) by mouth at bedtime as needed for sleep. 06/02/23  Fleming, Zelda W, NP  triamcinolone  cream (KENALOG ) 0.1 % Apply 1 application topically daily until resolved. 08/25/23   Newlin, Enobong, MD  TRUEplus Lancets 28G MISC Use as instructed to check blood glucose level by fingerstick 3 times per day. 06/02/23   Fleming, Zelda W, NP    Allergies: Patient has no known allergies.    Review of Systems  Constitutional:  Negative for chills and fever.  HENT:  Negative for sore throat.   Eyes:  Negative for discharge and redness.  Respiratory:  Negative for cough and shortness of breath.   Cardiovascular:  Negative for chest pain and leg swelling.  Gastrointestinal:  Negative for abdominal  pain and vomiting.  Genitourinary:  Negative for dysuria.  Musculoskeletal:  Negative for back pain and neck pain.  Skin:  Positive for rash.  Neurological:  Negative for headaches.    Updated Vital Signs BP 118/85 (BP Location: Left Arm)   Pulse 84   Temp 97.8 F (36.6 C) (Oral)   Resp 17   LMP  (LMP Unknown)   SpO2 100%   Physical Exam Vitals and nursing note reviewed.  Constitutional:      Appearance: Normal appearance. She is well-developed.  HENT:     Head: Atraumatic.     Nose: Nose normal.     Mouth/Throat:     Mouth: Mucous membranes are moist.     Pharynx: Oropharynx is clear. No oropharyngeal exudate or posterior oropharyngeal erythema.     Comments: No mm lesions.  Eyes:     General: No scleral icterus.    Conjunctiva/sclera: Conjunctivae normal.  Neck:     Trachea: No tracheal deviation.  Cardiovascular:     Rate and Rhythm: Normal rate and regular rhythm.     Pulses: Normal pulses.     Heart sounds: Normal heart sounds. No murmur heard.    No friction rub. No gallop.  Pulmonary:     Effort: Pulmonary effort is normal. No respiratory distress.     Breath sounds: Normal breath sounds. No stridor.  Abdominal:     General: There is no distension.     Palpations: Abdomen is soft.     Tenderness: There is no abdominal tenderness.  Musculoskeletal:        General: No swelling.     Cervical back: Normal range of motion and neck supple. No rigidity. No muscular tenderness.     Comments: Sparse, fine papular rash to anterior aspect bilateral lower legs and small patch of fine papules to volar aspect left forearm. No other skin lesions or rashes noted. No mm lesions. No palms or soles. The skin of left lower leg anteriorly is w mild erythema, warmth and tenderness. No abscess. No crepitus. Bil feet are of normal color and warmth, with no rash, and intact distal pulses.   Skin:    General: Skin is warm and dry.     Findings: No rash.  Neurological:     Mental  Status: She is alert.     Comments: Alert, speech normal.   Psychiatric:        Mood and Affect: Mood normal.     (all labs ordered are listed, but only abnormal results are displayed) Labs Reviewed - No data to display  EKG: None  Radiology: No results found.   Procedures   Medications Ordered in the ED  predniSONE  (DELTASONE ) tablet 60 mg (60 mg Oral Given 11/06/23 1826)  cephALEXin  (KEFLEX ) capsule 1,000 mg (1,000 mg Oral Given 11/06/23  49)                                    Medical Decision Making Problems Addressed: Cellulitis of left lower leg: acute illness or injury Dermatitis: acute illness or injury  Amount and/or Complexity of Data Reviewed External Data Reviewed: notes.  Risk Prescription drug management.   Pt with rash/dermatitis of not entirely clear cause for past month or so. Now also w mild erythema/warmth/tenderness to skin of leg lower leg anteriorly, ?cellulitis.  Overall, pt is well appearing, does not appear ill. Vitals normal.   Keflex  po. Prednisone  po. Rx for home.   Pt currently appears stable for ED d/c.   Rec pcp/derm f/u.  Return precautions provided.        Final diagnoses:  Dermatitis  Cellulitis of left lower leg    ED Discharge Orders          Ordered    predniSONE  (DELTASONE ) 20 MG tablet        11/06/23 1853    cephALEXin  (KEFLEX ) 500 MG capsule  4 times daily        11/06/23 1853               Bernard Drivers, MD 11/06/23 1856

## 2023-11-06 NOTE — ED Triage Notes (Signed)
 Patient c/o rash on her legs  x1 month. Patient report she applying lotion with no relief.

## 2023-11-06 NOTE — Discharge Instructions (Signed)
 It was our pleasure to provide your ER care today - we hope that you feel better.  Keep skin clean/dry. Avoid any perfumed soaps, detergents or body lotions. May use dove, eucerin, aveeno or similar.   Take prednisone  and keflex  as prescribed.   Follow up with primary care doctor/dermatologist in one week if symptoms fail to improve/resolve.  Return to ER if worse, new symptoms, fevers, increased swelling, spreading redness, severe pain, trouble breathing, or other concern.

## 2023-11-07 ENCOUNTER — Other Ambulatory Visit: Payer: Self-pay

## 2023-11-07 ENCOUNTER — Other Ambulatory Visit (HOSPITAL_COMMUNITY): Payer: Self-pay

## 2023-11-08 ENCOUNTER — Other Ambulatory Visit (HOSPITAL_COMMUNITY): Payer: Self-pay

## 2023-11-12 ENCOUNTER — Other Ambulatory Visit: Payer: Self-pay

## 2023-11-19 ENCOUNTER — Other Ambulatory Visit: Payer: Self-pay

## 2023-11-28 ENCOUNTER — Ambulatory Visit (INDEPENDENT_AMBULATORY_CARE_PROVIDER_SITE_OTHER): Admitting: Physician Assistant

## 2023-11-28 DIAGNOSIS — E1142 Type 2 diabetes mellitus with diabetic polyneuropathy: Secondary | ICD-10-CM

## 2023-12-01 ENCOUNTER — Encounter: Payer: Self-pay | Admitting: Nurse Practitioner

## 2023-12-01 ENCOUNTER — Other Ambulatory Visit: Payer: Self-pay

## 2023-12-01 ENCOUNTER — Encounter: Payer: Self-pay | Admitting: Physician Assistant

## 2023-12-01 NOTE — Progress Notes (Signed)
 Office Visit Note   Patient: Amanda Tran           Date of Birth: [birth year]           MRN: [id number] Visit Date: 11/28/2023              Requested by: Theotis Haze ORN, NP [address] [place],  [place] [address] PCP: Theotis Haze ORN, NP  Chief Complaint  Patient presents with   Right Shoulder - Pain      HPI: Patient cancelled  Assessment & Plan: Visit Diagnoses: No diagnosis found.  Plan:   Follow-Up Instructions: No follow-ups on file.   Ortho Exam  Patient is alert, oriented, no adenopathy, well-dressed, normal affect, normal respiratory effort.     Imaging: No results found. No images are attached to the encounter.  Labs: Lab Results  Component Value Date   HGBA1C 5.8 09/02/2023   HGBA1C 6.3 02/28/2023   HGBA1C 7.4 (H) 11/22/2022   ESRSEDRATE 10 07/10/2022   CRP 2.3 (H) 07/10/2022   LABURIC 4.2 01/01/2021   REPTSTATUS 01/26/2023 FINAL 01/24/2023   GRAMSTAIN  08/06/2009    FEW WBC PRESENT, PREDOMINANTLY PMN NO SQUAMOUS EPITHELIAL CELLS SEEN MODERATE GRAM POSITIVE COCCI IN CLUSTERS IN PAIRS FEW GRAM NEGATIVE RODS   CULT >=100,000 COLONIES/mL ESCHERICHIA COLI (A) 01/24/2023   LABORGA ESCHERICHIA COLI (A) 01/24/2023     Lab Results  Component Value Date   ALBUMIN 4.3 09/02/2023   ALBUMIN 4.0 05/22/2023   ALBUMIN 4.3 11/22/2022    No results found for: MG Lab Results  Component Value Date   VD25OH 50.8 08/09/2019   VD25OH 11.4 (L) 02/25/2019   VD25OH 24.8 (L) 12/03/2017    No results found for: PREALBUMIN    Latest Ref Rng & Units 09/02/2023    4:58 PM 05/22/2023    1:23 PM 07/10/2022    9:44 AM  CBC EXTENDED  WBC 3.4 - 10.8 x10E3/uL 5.1  5.5  5.9   RBC 3.77 - 5.28 x10E6/uL 5.34  5.77  5.14   Hemoglobin 11.1 - 15.9 g/dL 85.2  84.5  86.7   HCT 34.0 - 46.6 % 45.4  47.7  42.2   Platelets 150 - 450 x10E3/uL 305  294  286   NEUT# 1.4 - 7.0 x10E3/uL 2.7   4.1   Lymph# 0.7 - 3.1 x10E3/uL 1.9   1.4       There is no height or weight on file to calculate BMI.  Orders:  No orders of the defined types were placed in this encounter.  No orders of the defined types were placed in this encounter.    Procedures: No procedures performed  Clinical Data: No additional findings.  ROS:  All other systems negative, except as noted in the HPI. Review of Systems  Objective: Vital Signs: LMP  (LMP Unknown)   Specialty Comments:  No specialty comments available.  PMFS History: Patient Active Problem List   Diagnosis Date Noted   Diabetes mellitus (HCC) 04/23/2023   Class 2 severe obesity with serious comorbidity and body mass index (BMI) of 38.0 to 38.9 in adult [facility]) 04/23/2023   Diabetic polyneuropathy associated with type 2 diabetes mellitus (HCC) 08/22/2022   Constipation 09/22/2019   Rectal bleeding 09/22/2019   Screening breast examination 05/04/2019   Shortness of breath 07/02/2016   Routine screening for STI (sexually transmitted infection) 07/02/2016   Vaginal irritation 10/06/2015   Boil 10/06/2015   HTN (hypertension) 01/09/2015   Vitamin  D insufficiency 12/21/2014   Fatigue 12/21/2014   Allergic rhinitis 12/21/2014   Family history of diabetes mellitus (DM) 03/23/2014   Iron deficiency anemia 03/18/2012   Nicotine  dependence 03/18/2012   Past Medical History:  Diagnosis Date   Anemia    Anxiety    Diabetes (HCC)    Diabetes mellitus (HCC) 04/23/2023   DVT (deep venous thrombosis) (HCC) 03/11/2012   Left popliteal    Hyperlipidemia    Hypertension    Vitamin D  deficiency     Family History  Problem Relation Age of Onset   Kidney failure Mother    Hypertension Father    Diabetes Father    Diabetes Sister    Colon cancer Neg Hx    Esophageal cancer Neg Hx    Stomach cancer Neg Hx    Rectal cancer Neg Hx    Breast cancer Neg Hx    Colon polyps Neg Hx     Past Surgical History:  Procedure Laterality Date   CESAREAN SECTION     1990, 1993.1994,  1996   COLONOSCOPY     TUBAL LIGATION     Social History   Occupational History   Not on file  Tobacco Use   Smoking status: Former    Current packs/day: 0.00    Average packs/day: 0.5 packs/day for 25.0 years (12.5 ttl pk-yrs)    Types: Cigarettes    Start date: 09/21/1992    Quit date: 09/21/2017    Years since quitting: 6.1   Smokeless tobacco: Never  Vaping Use   Vaping status: Never Used  Substance and Sexual Activity   Alcohol use: Yes    Comment: occasional   Drug use: No   Sexual activity: Not Currently    Birth control/protection: Condom, Surgical    Comment: 1st intercourse 58 yo-More than 5 partners-BTL

## 2023-12-02 ENCOUNTER — Telehealth: Payer: Self-pay | Admitting: Nurse Practitioner

## 2023-12-02 NOTE — Telephone Encounter (Signed)
 Pt unconfirmed appt 9/10 lvm

## 2023-12-03 ENCOUNTER — Other Ambulatory Visit: Payer: Self-pay

## 2023-12-03 ENCOUNTER — Ambulatory Visit: Attending: Nurse Practitioner | Admitting: Nurse Practitioner

## 2023-12-03 ENCOUNTER — Encounter: Payer: Self-pay | Admitting: Nurse Practitioner

## 2023-12-03 VITALS — BP 107/72 | HR 105 | Resp 20 | Ht 61.0 in | Wt 204.8 lb

## 2023-12-03 DIAGNOSIS — Z7985 Long-term (current) use of injectable non-insulin antidiabetic drugs: Secondary | ICD-10-CM

## 2023-12-03 DIAGNOSIS — Z79899 Other long term (current) drug therapy: Secondary | ICD-10-CM

## 2023-12-03 DIAGNOSIS — E66812 Obesity, class 2: Secondary | ICD-10-CM

## 2023-12-03 DIAGNOSIS — Z86718 Personal history of other venous thrombosis and embolism: Secondary | ICD-10-CM

## 2023-12-03 DIAGNOSIS — M79662 Pain in left lower leg: Secondary | ICD-10-CM | POA: Diagnosis not present

## 2023-12-03 DIAGNOSIS — I10 Essential (primary) hypertension: Secondary | ICD-10-CM | POA: Diagnosis not present

## 2023-12-03 DIAGNOSIS — E1165 Type 2 diabetes mellitus with hyperglycemia: Secondary | ICD-10-CM

## 2023-12-03 DIAGNOSIS — Z6838 Body mass index (BMI) 38.0-38.9, adult: Secondary | ICD-10-CM

## 2023-12-03 MED ORDER — GLIMEPIRIDE 4 MG PO TABS
8.0000 mg | ORAL_TABLET | Freq: Every day | ORAL | 1 refills | Status: AC
  Filled 2023-12-03: qty 60, 30d supply, fill #0
  Filled 2023-12-18 – 2024-03-12 (×3): qty 60, 30d supply, fill #1

## 2023-12-03 NOTE — Progress Notes (Unsigned)
 Assessment & Plan:  Amanda Tran  was seen today for hypertension and leg pain.  Diagnoses and all orders for this visit:  Primary hypertension -     CMP14+EGFR Continue all antihypertensives as prescribed.  Reminded to bring in blood pressure log for follow  up appointment.  RECOMMENDATIONS: DASH/Mediterranean Diets are healthier choices for HTN.    Type 2 diabetes mellitus with hyperglycemia, without long-term current use of insulin (HCC) -     glimepiride  (AMARYL ) 4 MG tablet; Take 2 tablets (8 mg total) by mouth daily with breakfast. Lab Results  Component Value Date   HGBA1C 5.8 09/02/2023    Pain in left lower leg -     VAS US  LOWER EXTREMITY VENOUS (DVT); Future Chronic severe pain with history of blood clots. No swelling. Possible vascular issues. - Order vascular ultrasound of the left lower leg.  Obesity Weight reduced from 220 lbs to 204 lbs. No exercise regimen. - Encourage cardiovascular exercise for further weight loss.   General Health Maintenance Routine health maintenance up to date. No flu vaccine today.  Patient has been counseled on age-appropriate routine health concerns for screening and prevention. These are reviewed and up-to-date. Referrals have been placed accordingly. Immunizations are up-to-date or declined.    Subjective:   Chief Complaint  Patient presents with   Hypertension   Leg Pain    Left leg pain    History of Present Illness Amanda  L Tran is a 58 year old female who presents with severe pain in her left leg and for follow up to HTN.   She has a past medical history of Anemia, Anxiety, DM 2, DVT (03/11/2012), Hyperlipidemia, Hypertension, and Vitamin D  deficiency.   She experiences severe pain in her left leg, radiating from the lower leg to the upper thigh. Meloxicam  has been ineffective. The pain is intense, often causing her to cry, especially after work, and she struggles to drive home due to the discomfort. The pain  worsens when she is off her feet, and it was recently so severe that she was unable to drive to the hospital or have her daughter take her. She has a history of LLE DVT    Blood pressure is well controlled.  BP Readings from Last 3 Encounters:  12/03/23 107/72  11/06/23 122/80  09/02/23 119/82     Review of Systems  Constitutional:  Negative for fever, malaise/fatigue and weight loss.  HENT: Negative.  Negative for nosebleeds.   Eyes: Negative.  Negative for blurred vision, double vision and photophobia.  Respiratory: Negative.  Negative for cough and shortness of breath.   Cardiovascular: Negative.  Negative for chest pain, palpitations and leg swelling.  Gastrointestinal: Negative.  Negative for heartburn, nausea and vomiting.  Musculoskeletal:  Negative for myalgias.       SEE HPI  Neurological: Negative.  Negative for dizziness, focal weakness, seizures and headaches.  Psychiatric/Behavioral: Negative.  Negative for suicidal ideas.     Past Medical History:  Diagnosis Date   Anemia    Anxiety    Diabetes (HCC)    Diabetes mellitus (HCC) 04/23/2023   DVT (deep venous thrombosis) (HCC) 03/11/2012   Left popliteal    Hyperlipidemia    Hypertension    Vitamin D  deficiency     Past Surgical History:  Procedure Laterality Date   CESAREAN SECTION     1990, [phone], 1996   COLONOSCOPY     TUBAL LIGATION      Family History  Problem Relation Age of  Onset   Kidney failure Mother    Hypertension Father    Diabetes Father    Diabetes Sister    Colon cancer Neg Hx    Esophageal cancer Neg Hx    Stomach cancer Neg Hx    Rectal cancer Neg Hx    Breast cancer Neg Hx    Colon polyps Neg Hx     Social History Reviewed with no changes to be made today.   Outpatient Medications Prior to Visit  Medication Sig Dispense Refill   albuterol  (VENTOLIN  HFA) 108 (90 Base) MCG/ACT inhaler Inhale 2 puffs into the lungs every 6 (six) hours as needed for wheezing or shortness of  breath. 6.7 g 1   amLODipine  (NORVASC ) 10 MG tablet Take 1 tablet (10 mg total) by mouth daily. 90 tablet 1   atorvastatin  (LIPITOR) 20 MG tablet Take 1 tablet (20 mg total) by mouth daily. 90 tablet 2   Blood Glucose Monitoring Suppl (TRUE METRIX METER) w/Device KIT Use as instructed to heck blood glucose level by fingerstick 3 times per day. 1 kit 0   cetirizine  (ZYRTEC  ALLERGY) 10 MG tablet Take 1 tablet (10 mg total) by mouth daily. 90 tablet 3   glucose blood (TRUE METRIX BLOOD GLUCOSE TEST) test strip Use as instructed to check blood glucose levels by fingerstick 3 times per day. 100 each 12   lisinopril  (ZESTRIL ) 10 MG tablet Take 1 tablet (10 mg total) by mouth daily. 90 tablet 1   meloxicam  (MOBIC ) 15 MG tablet Take 1 tablet (15 mg total) by mouth daily. 21 tablet 0   Semaglutide , 2 MG/DOSE, (OZEMPIC , 2 MG/DOSE,) 8 MG/3ML SOPN Inject 2 mg as directed once a week. 3 mL 6   traZODone  (DESYREL ) 50 MG tablet Take 0.5-1 tablets (25-50 mg total) by mouth at bedtime as needed for sleep. 90 tablet 1   triamcinolone  cream (KENALOG ) 0.1 % Apply 1 application topically daily until resolved. 45 g 0   TRUEplus Lancets 28G MISC Use as instructed to check blood glucose level by fingerstick 3 times per day. 100 each 3   cephALEXin  (KEFLEX ) 500 MG capsule Take 1 capsule (500 mg total) by mouth 4 (four) times daily. 20 capsule 0   glimepiride  (AMARYL ) 4 MG tablet Take 2 tablets (8 mg total) by mouth daily with breakfast. 180 tablet 1   acetaminophen -codeine  (TYLENOL  #3) 300-30 MG tablet Take 1 tablet by mouth every 8 (eight) hours as needed for moderate pain (pain score 4-6). (Patient not taking: Reported on 12/03/2023) 30 tablet 0   mupirocin  ointment (BACTROBAN ) 2 % Apply 1 Application topically 2 (two) times daily. (Patient not taking: Reported on 12/03/2023) 60 g 10   No facility-administered medications prior to visit.    No Known Allergies     Objective:    BP 107/72 (BP Location: Left Arm,  Patient Position: Sitting, Cuff Size: Normal)   Pulse (!) 105   Resp 20   Ht 5' 1 (1.549 m)   Wt 204 lb 12.8 oz (92.9 kg)   LMP  (LMP Unknown)   SpO2 96%   BMI 38.70 kg/m  Wt Readings from Last 3 Encounters:  12/03/23 204 lb 12.8 oz (92.9 kg)  09/02/23 211 lb (95.7 kg)  06/02/23 213 lb 12.8 oz (97 kg)    Physical Exam Vitals and nursing note reviewed.  Constitutional:      Appearance: She is well-developed.  HENT:     Head: Normocephalic and atraumatic.  Cardiovascular:  Rate and Rhythm: Normal rate and regular rhythm.     Heart sounds: Normal heart sounds. No murmur heard.    No friction rub. No gallop.  Pulmonary:     Effort: Pulmonary effort is normal. No tachypnea or respiratory distress.     Breath sounds: Normal breath sounds. No decreased breath sounds, wheezing, rhonchi or rales.  Chest:     Chest wall: No tenderness.  Musculoskeletal:        General: Normal range of motion.     Cervical back: Normal range of motion.  Skin:    General: Skin is warm and dry.  Neurological:     Mental Status: She is alert and oriented to person, place, and time.     Coordination: Coordination normal.  Psychiatric:        Behavior: Behavior normal. Behavior is cooperative.        Thought Content: Thought content normal.        Judgment: Judgment normal.          Patient has been counseled extensively about nutrition and exercise as well as the importance of adherence with medications and regular follow-up. The patient was given clear instructions to go to ER or return to medical center if symptoms don't improve, worsen or new problems develop. The patient verbalized understanding.   Follow-up: Return in about 4 months (around 04/03/2024).   Haze LELON Servant, FNP-BC [facility], [place] [phone]   12/04/2023, 9:50 AM

## 2023-12-03 NOTE — Patient Instructions (Addendum)
 Amanda Tran

## 2023-12-04 ENCOUNTER — Other Ambulatory Visit: Payer: Self-pay

## 2023-12-04 ENCOUNTER — Encounter: Payer: Self-pay | Admitting: Nurse Practitioner

## 2023-12-04 ENCOUNTER — Ambulatory Visit: Payer: Self-pay | Admitting: Nurse Practitioner

## 2023-12-04 ENCOUNTER — Ambulatory Visit (HOSPITAL_COMMUNITY)
Admission: RE | Admit: 2023-12-04 | Discharge: 2023-12-04 | Disposition: A | Discharge status: Home / Self Care | Source: Ambulatory Visit | Attending: Surgery | Admitting: Surgery

## 2023-12-04 DIAGNOSIS — M79662 Pain in left lower leg: Secondary | ICD-10-CM | POA: Insufficient documentation

## 2023-12-04 DIAGNOSIS — E1165 Type 2 diabetes mellitus with hyperglycemia: Secondary | ICD-10-CM

## 2023-12-04 LAB — CMP14+EGFR
ALT: 25 IU/L (ref 0–32)
AST: 25 IU/L (ref 0–40)
Albumin: 4.3 g/dL (ref 3.8–4.9)
Alkaline Phosphatase: 136 IU/L — ABNORMAL HIGH (ref 44–121)
BUN/Creatinine Ratio: 11 (ref 9–23)
BUN: 11 mg/dL (ref 6–24)
Bilirubin Total: 0.2 mg/dL (ref 0.0–1.2)
CO2: 22 mmol/L (ref 20–29)
Calcium: 9.6 mg/dL (ref 8.7–10.2)
Chloride: 104 mmol/L (ref 96–106)
Creatinine, Ser: 1.04 mg/dL — ABNORMAL HIGH (ref 0.57–1.00)
Globulin, Total: 2.8 g/dL (ref 1.5–4.5)
Glucose: 134 mg/dL — ABNORMAL HIGH (ref 70–99)
Potassium: 4.2 mmol/L (ref 3.5–5.2)
Sodium: 141 mmol/L (ref 134–144)
Total Protein: 7.1 g/dL (ref 6.0–8.5)
eGFR: 63 mL/min/1.73 (ref >59)

## 2023-12-04 NOTE — Telephone Encounter (Signed)
 Yes please. Thank you

## 2023-12-05 ENCOUNTER — Ambulatory Visit (INDEPENDENT_AMBULATORY_CARE_PROVIDER_SITE_OTHER): Admitting: Surgical

## 2023-12-05 ENCOUNTER — Other Ambulatory Visit: Payer: Self-pay

## 2023-12-05 DIAGNOSIS — M19011 Primary osteoarthritis, right shoulder: Secondary | ICD-10-CM | POA: Diagnosis not present

## 2023-12-05 DIAGNOSIS — M25511 Pain in right shoulder: Secondary | ICD-10-CM

## 2023-12-07 ENCOUNTER — Encounter: Payer: Self-pay | Admitting: Surgical

## 2023-12-07 MED ORDER — BUPIVACAINE HCL 0.25 % IJ SOLN
0.5000 mL | INTRAMUSCULAR | Status: AC | PRN
  Administered 2023-12-05: .5 mL via INTRA_ARTICULAR

## 2023-12-07 MED ORDER — LIDOCAINE HCL 1 % IJ SOLN
3.0000 mL | INTRAMUSCULAR | Status: AC | PRN
  Administered 2023-12-05: 3 mL

## 2023-12-07 MED ORDER — TRIAMCINOLONE ACETONIDE 40 MG/ML IJ SUSP
20.0000 mg | INTRAMUSCULAR | Status: AC | PRN
  Administered 2023-12-05: 20 mg via INTRA_ARTICULAR

## 2023-12-07 NOTE — Progress Notes (Signed)
   Procedure Note  Patient: Amanda Tran             Date of Birth: 06/19/1965           MRN: [id number]             Visit Date: 12/05/2023  Procedures: Visit Diagnoses:  1. Right shoulder pain, unspecified chronicity   2. Arthritis of right acromioclavicular joint     Medium Joint Inj: R acromioclavicular on 12/05/2023 9:33 AM Indications: diagnostic evaluation and pain Details: 25 G 1.5 in needle, ultrasound-guided superior approach Medications: 3 mL lidocaine  1 %; 0.5 mL bupivacaine  0.25 %; 20 mg triamcinolone  acetonide 40 MG/ML Outcome: tolerated well, no immediate complications Procedure, treatment alternatives, risks and benefits explained, specific risks discussed. Consent was given by the patient. Immediately prior to procedure a time out was called to verify the correct patient, procedure, equipment, support staff and site/side marked as required. Patient was prepped and draped in the usual sterile fashion.

## 2023-12-16 ENCOUNTER — Other Ambulatory Visit: Payer: Self-pay

## 2023-12-16 DIAGNOSIS — D234 Other benign neoplasm of skin of scalp and neck: Secondary | ICD-10-CM | POA: Diagnosis not present

## 2023-12-16 DIAGNOSIS — L308 Other specified dermatitis: Secondary | ICD-10-CM | POA: Diagnosis not present

## 2023-12-16 MED ORDER — BETAMETHASONE DIPROPIONATE 0.05 % EX CREA
1.0000 | TOPICAL_CREAM | Freq: Two times a day (BID) | CUTANEOUS | 3 refills | Status: AC | PRN
  Filled 2023-12-16: qty 60, 30d supply, fill #0

## 2023-12-17 ENCOUNTER — Other Ambulatory Visit: Payer: Self-pay

## 2023-12-18 ENCOUNTER — Other Ambulatory Visit (HOSPITAL_COMMUNITY): Payer: Self-pay

## 2023-12-18 ENCOUNTER — Other Ambulatory Visit: Payer: Self-pay | Admitting: Surgical

## 2023-12-18 ENCOUNTER — Other Ambulatory Visit: Payer: Self-pay | Admitting: Nurse Practitioner

## 2023-12-18 DIAGNOSIS — L0292 Furuncle, unspecified: Secondary | ICD-10-CM

## 2023-12-18 MED ORDER — MELOXICAM 15 MG PO TABS
15.0000 mg | ORAL_TABLET | Freq: Every day | ORAL | 0 refills | Status: DC
  Filled 2023-12-18 – 2023-12-22 (×2): qty 21, 21d supply, fill #0

## 2023-12-18 MED ORDER — MUPIROCIN 2 % EX OINT
1.0000 | TOPICAL_OINTMENT | Freq: Two times a day (BID) | CUTANEOUS | 10 refills | Status: AC
  Filled 2023-12-18: qty 22, 11d supply, fill #0
  Filled 2023-12-22: qty 44, 22d supply, fill #0

## 2023-12-22 ENCOUNTER — Other Ambulatory Visit: Payer: Self-pay

## 2023-12-22 ENCOUNTER — Other Ambulatory Visit (HOSPITAL_COMMUNITY): Payer: Self-pay

## 2023-12-23 ENCOUNTER — Other Ambulatory Visit (HOSPITAL_COMMUNITY): Payer: Self-pay

## 2023-12-23 ENCOUNTER — Other Ambulatory Visit: Payer: Self-pay | Admitting: Orthopedic Surgery

## 2023-12-23 MED ORDER — ACETAMINOPHEN-CODEINE 300-30 MG PO TABS
1.0000 | ORAL_TABLET | Freq: Every day | ORAL | 0 refills | Status: AC | PRN
  Filled 2023-12-23 – 2024-03-12 (×2): qty 30, 30d supply, fill #0

## 2024-01-03 ENCOUNTER — Other Ambulatory Visit (HOSPITAL_COMMUNITY): Payer: Self-pay

## 2024-01-15 ENCOUNTER — Ambulatory Visit: Admitting: Skilled Nursing Facility1

## 2024-01-26 ENCOUNTER — Encounter: Payer: Self-pay | Admitting: Radiology

## 2024-02-09 ENCOUNTER — Other Ambulatory Visit: Payer: Self-pay

## 2024-03-08 ENCOUNTER — Encounter: Attending: Nurse Practitioner | Admitting: Skilled Nursing Facility1

## 2024-03-08 ENCOUNTER — Encounter: Payer: Self-pay | Admitting: Skilled Nursing Facility1

## 2024-03-08 DIAGNOSIS — E1165 Type 2 diabetes mellitus with hyperglycemia: Secondary | ICD-10-CM | POA: Insufficient documentation

## 2024-03-08 NOTE — Progress Notes (Unsigned)
 DM medications: Ozempic    Pt states she does have well controlled DM but wants to make sure she is doing everything she can to continue to have a controlled A1C. Pt states she has been controlling everything. Pt states she does check her blood sugar about once a day typically fasting: 99-104.  Pt states she eats lunch from work and [employer] for breakfast and at home dinner.    Pt states she does not have an appetite and is exhausted after she leaves work. Pt reports blood sugars in the 50's 1-2 times per week.   In office pts blood sugar 50 after correcting up to 80 and feeling well before leaving office.   Appt time: 3:58 End time: 5:00 Diabetes Self-Management Education  Visit Type: First/Initial   03/11/2024  Amanda Tran, identified by name and date of birth, is a 58 y.o. female with a diagnosis of Diabetes: Type 2.   ASSESSMENT  There were no vitals taken for this visit. There is no height or weight on file to calculate BMI.   Diabetes Self-Management Education - 03/08/24 1724       Visit Information   Visit Type First/Initial      Initial Visit   Diabetes Type Type 2    Are you currently following a meal plan? No    Are you taking your medications as prescribed? Yes      Health Coping   How would you rate your overall health? Good      Psychosocial Assessment   Patient Belief/Attitude about Diabetes Motivated to manage diabetes    What is the hardest part about your diabetes right now, causing you the most concern, or is the most worrisome to you about your diabetes?   Making healty food and beverage choices;Getting support / problem solving    Self-care barriers None    Self-management support Friends    Patient Concerns Nutrition/Meal planning;Healthy Lifestyle;Problem Solving    Special Needs None    Preferred Learning Style Visual;Hands on    Learning Readiness Contemplating    How often do you need to have someone help you when you read  instructions, pamphlets, or other written materials from your doctor or pharmacy? 1 - Never      Pre-Education Assessment   Patient understands the diabetes disease and treatment process. Needs Instruction    Patient understands incorporating nutritional management into lifestyle. Needs Instruction    Patient undertands incorporating physical activity into lifestyle. Needs Instruction    Patient understands using medications safely. Needs Instruction    Patient understands monitoring blood glucose, interpreting and using results Needs Instruction    Patient understands prevention, detection, and treatment of acute complications. Needs Instruction    Patient understands prevention, detection, and treatment of chronic complications. Needs Instruction    Patient understands how to develop strategies to address psychosocial issues. Needs Instruction    Patient understands how to develop strategies to promote health/change behavior. Needs Instruction      Complications   Last HgB A1C per patient/outside source 5.8 %    How often do you check your blood sugar? 1-2 times/day    Fasting Blood glucose range (mg/dL) 29-870;<29    Number of hypoglycemic episodes per month 4    Can you tell when your blood sugar is low? Yes   but not every time   What do you do if your blood sugar is low? eats little debbies    Number of hyperglycemic episodes ( >200mg /dL): Never  Can you tell when your blood sugar is high? No    Have you had a dilated eye exam in the past 12 months? No    Have you had a dental exam in the past 12 months? No    Are you checking your feet? No      Activity / Exercise   Activity / Exercise Type ADL's    How many days per week do you exercise? 0    How many minutes per day do you exercise? 0    Total minutes per week of exercise 0      Patient Education   Previous Diabetes Education No    Disease Pathophysiology Factors that contribute to the development of diabetes    Healthy  Eating Role of diet in the treatment of diabetes and the relationship between the three main macronutrients and blood glucose level;Food label reading, portion sizes and measuring food.;Plate Method;Reviewed blood glucose goals for pre and post meals and how to evaluate the patients' food intake on their blood glucose level.;Information on hints to eating out and maintain blood glucose control.;Meal timing in regards to the patients' current diabetes medication.    Medications Taught/reviewed insulin/injectables, injection, site rotation, insulin/injectables storage and needle disposal.;Reviewed patients medication for diabetes, action, purpose, timing of dose and side effects.    Monitoring Taught/evaluated SMBG meter.;Daily foot exams;Yearly dilated eye exam    Acute complications Taught prevention, symptoms, and  treatment of hypoglycemia - the 15 rule.    Chronic complications Dental care;Retinopathy and reason for yearly dilated eye exams;Nephropathy, what it is, prevention of, the use of ACE, ARB's and early detection of through urine microalbumia.;Lipid levels, blood glucose control and heart disease;Assessed and discussed foot care and prevention of foot problems    Diabetes Stress and Support Identified and addressed patients feelings and concerns about diabetes;Worked with patient to identify barriers to care and solutions    Lifestyle and Health Coping Lifestyle issues that need to be addressed for better diabetes care      Individualized Goals (developed by patient)   Nutrition Follow meal plan discussed;General guidelines for healthy choices and portions discussed    Monitoring  Test my blood glucose as discussed;Test blood glucose pre and post meals as discussed    Problem Solving Eating Pattern    Reducing Risk do foot checks daily;treat hypoglycemia with 15 grams of carbs if blood glucose less than 70mg /dL      Post-Education Assessment   Patient understands the diabetes disease and  treatment process. Demonstrates understanding / competency    Patient understands incorporating nutritional management into lifestyle. Demonstrates understanding / competency    Patient undertands incorporating physical activity into lifestyle. Demonstrates understanding / competency    Patient understands using medications safely. Demonstrates understanding / competency    Patient understands monitoring blood glucose, interpreting and using results Demonstrates understanding / competency    Patient understands prevention, detection, and treatment of acute complications. Demonstrates understanding / competency    Patient understands prevention, detection, and treatment of chronic complications. Demonstrates understanding / competency    Patient understands how to develop strategies to address psychosocial issues. Demonstrates understanding / competency    Patient understands how to develop strategies to promote health/change behavior. Demonstrates understanding / competency      Outcomes   Expected Outcomes Demonstrated interest in learning. Expect positive outcomes    Future DMSE 4-6 wks    Program Status Completed          Individualized Plan  for Diabetes Self-Management Training:   Learning Objective:  Patient will have a greater understanding of diabetes self-management. Patient education plan is to attend individual and/or group sessions per assessed needs and concerns.   Expected Outcomes:  Demonstrated interest in learning. Expect positive outcomes  Education material provided: ADA - How to Thrive: A Guide for Your Journey with Diabetes, Food label handouts, Meal plan card, My Plate, Snack sheet, and Support group flyer  If problems or questions, patient to contact team via:  Phone and Email  Future DSME appointment: 4-6 wks

## 2024-03-12 ENCOUNTER — Other Ambulatory Visit: Payer: Self-pay

## 2024-03-12 ENCOUNTER — Other Ambulatory Visit: Payer: Self-pay | Admitting: Surgical

## 2024-03-12 ENCOUNTER — Other Ambulatory Visit: Payer: Self-pay | Admitting: Nurse Practitioner

## 2024-03-12 DIAGNOSIS — E1165 Type 2 diabetes mellitus with hyperglycemia: Secondary | ICD-10-CM

## 2024-03-12 MED ORDER — MELOXICAM 15 MG PO TABS
15.0000 mg | ORAL_TABLET | Freq: Every day | ORAL | 0 refills | Status: AC
  Filled 2024-03-12: qty 21, 21d supply, fill #0

## 2024-03-12 MED ORDER — OZEMPIC (2 MG/DOSE) 8 MG/3ML ~~LOC~~ SOPN
2.0000 mg | PEN_INJECTOR | SUBCUTANEOUS | 6 refills | Status: AC
  Filled 2024-03-12 – 2024-04-08 (×2): qty 3, 28d supply, fill #0

## 2024-03-15 ENCOUNTER — Other Ambulatory Visit: Payer: Self-pay

## 2024-03-22 ENCOUNTER — Other Ambulatory Visit: Payer: Self-pay

## 2024-03-27 ENCOUNTER — Encounter (HOSPITAL_COMMUNITY): Payer: Self-pay

## 2024-03-27 ENCOUNTER — Other Ambulatory Visit: Payer: Self-pay

## 2024-03-27 ENCOUNTER — Emergency Department (HOSPITAL_COMMUNITY)
Admission: EM | Admit: 2024-03-27 | Discharge: 2024-03-28 | Disposition: A | Attending: Emergency Medicine | Admitting: Emergency Medicine

## 2024-03-27 DIAGNOSIS — M9905 Segmental and somatic dysfunction of pelvic region: Secondary | ICD-10-CM | POA: Insufficient documentation

## 2024-03-27 DIAGNOSIS — I10 Essential (primary) hypertension: Secondary | ICD-10-CM | POA: Insufficient documentation

## 2024-03-27 DIAGNOSIS — Z79899 Other long term (current) drug therapy: Secondary | ICD-10-CM | POA: Insufficient documentation

## 2024-03-27 DIAGNOSIS — E119 Type 2 diabetes mellitus without complications: Secondary | ICD-10-CM | POA: Diagnosis not present

## 2024-03-27 DIAGNOSIS — M6289 Other specified disorders of muscle: Secondary | ICD-10-CM

## 2024-03-27 DIAGNOSIS — Z7984 Long term (current) use of oral hypoglycemic drugs: Secondary | ICD-10-CM | POA: Insufficient documentation

## 2024-03-27 NOTE — ED Triage Notes (Signed)
 Pt states that she felt an abscess on the inside of her vagina approx 1 hour. Pt denies discharge/dysuria

## 2024-03-28 NOTE — Discharge Instructions (Addendum)
 We recommend follow-up with your primary care doctor and/or an OB/GYN for reevaluation of your symptoms.  Try to perform Kegel exercises regularly to strengthen your pelvic floor.  You may return for new or concerning symptoms.

## 2024-03-28 NOTE — ED Provider Notes (Signed)
 " [place] EMERGENCY DEPARTMENT AT [facility] Provider Note   CSN: [id number] Arrival date & time: 03/27/24  1929     Patient presents with: Abscess   Kabrea  L Limb is a 59 y.o. female.   59 year old female presents to the emergency department for evaluation.  She states that she was getting out of the shower approximately 1 hour prior to arrival when she felt a bulge at the opening of her vaginal area.  She describes a mild pressure at this site, but denies pain.  She has not had any vaginal discharge, dysuria, hematuria, inability to have a bowel movement.  No associated fevers or other drainage/bleeding.  No interventions PTA.  The history is provided by the patient. No language interpreter was used.  Abscess      Prior to Admission medications  Medication Sig Start Date End Date Taking? Authorizing Provider  acetaminophen -codeine  (TYLENOL  #3) 300-30 MG tablet Take 1 tablet by mouth daily as needed for moderate pain (pain score 4-6). 12/23/23   Magnant, Charles L, PA-C  albuterol  (VENTOLIN  HFA) 108 (90 Base) MCG/ACT inhaler Inhale 2 puffs into the lungs every 6 (six) hours as needed for wheezing or shortness of breath. 09/02/23   Fleming, Zelda W, NP  amLODipine  (NORVASC ) 10 MG tablet Take 1 tablet (10 mg total) by mouth daily. 09/02/23   Fleming, Zelda W, NP  atorvastatin  (LIPITOR) 20 MG tablet Take 1 tablet (20 mg total) by mouth daily. 09/02/23   Fleming, Zelda W, NP  betamethasone  dipropionate 0.05 % cream Apply 1 Application topically 2 (two) times daily as needed TO THE AFFECTED AREA(S) OF LEGS. DO NOT APPLY TO FACE, GROIN, UNDERARMS. 12/16/23   Shona Rush, MD  Blood Glucose Monitoring Suppl (TRUE METRIX METER) w/Device KIT Use as instructed to heck blood glucose level by fingerstick 3 times per day. 10/21/22   Fleming, Zelda W, NP  cetirizine  (ZYRTEC  ALLERGY) 10 MG tablet Take 1 tablet (10 mg total) by mouth daily. 06/06/23   Fleming, Zelda W, NP  glimepiride   (AMARYL ) 4 MG tablet Take 2 tablets (8 mg total) by mouth daily with breakfast. 12/03/23   Fleming, Zelda W, NP  glucose blood (TRUE METRIX BLOOD GLUCOSE TEST) test strip Use as instructed to check blood glucose levels by fingerstick 3 times per day. 10/20/23   Fleming, Zelda W, NP  lisinopril  (ZESTRIL ) 10 MG tablet Take 1 tablet (10 mg total) by mouth daily. 09/02/23   Fleming, Zelda W, NP  meloxicam  (MOBIC ) 15 MG tablet Take 1 tablet (15 mg total) by mouth daily. 03/12/24   Magnant, Charles L, PA-C  mupirocin  ointment (BACTROBAN ) 2 % Apply 1 Application topically 2 (two) times daily. 12/18/23   Newlin, Enobong, MD  Semaglutide , 2 MG/DOSE, (OZEMPIC , 2 MG/DOSE,) 8 MG/3ML SOPN Inject 2 mg as directed once a week. 03/12/24   Fleming, Zelda W, NP  traZODone  (DESYREL ) 50 MG tablet Take 0.5-1 tablets (25-50 mg total) by mouth at bedtime as needed for sleep. 06/02/23   Fleming, Zelda W, NP  triamcinolone  cream (KENALOG ) 0.1 % Apply 1 application topically daily until resolved. 08/25/23   Newlin, Enobong, MD  TRUEplus Lancets 28G MISC Use as instructed to check blood glucose level by fingerstick 3 times per day. 06/02/23   Fleming, Zelda W, NP    Allergies: Patient has no known allergies.    Review of Systems Ten systems reviewed and are negative for acute change, except as noted in the HPI.    Updated Vital  Signs BP 138/88 (BP Location: Left Arm)   Pulse 83   Temp 98.7 F (37.1 C) (Oral)   Resp 16   LMP  (LMP Unknown)   SpO2 100%   Physical Exam Vitals and nursing note reviewed.  Constitutional:      General: She is not in acute distress.    Appearance: She is well-developed. She is not diaphoretic.     Comments: Nontoxic appearing and in NAD  HENT:     Head: Normocephalic and atraumatic.  Eyes:     General: No scleral icterus.    Conjunctiva/sclera: Conjunctivae normal.  Pulmonary:     Effort: Pulmonary effort is normal. No respiratory distress.  Genitourinary:    Comments: Exam chaperoned  by Wells, RN. Patient with normal external genitalia.  There is a slight bulge at the inferior aspect of the vaginal introitus suggestive of mild rectocele. No Bartholin's abscess. No other masses or lesions noted. Otherwise normal bimanual exam at bedside. Musculoskeletal:        General: Normal range of motion.     Cervical back: Normal range of motion.  Skin:    General: Skin is warm and dry.     Coloration: Skin is not pale.     Findings: No erythema or rash.  Neurological:     Mental Status: She is alert and oriented to person, place, and time.  Psychiatric:        Behavior: Behavior normal.     (all labs ordered are listed, but only abnormal results are displayed) Labs Reviewed - No data to display  EKG: None  Radiology: No results found.   Procedures   Medications Ordered in the ED - No data to display                                  Medical Decision Making  This patient presents to the ED for concern of vaginal bulging, this involves an extensive number of treatment options, and is a complaint that carries with it a high risk of complications and morbidity.  The differential diagnosis includes mass vs Bartholin's abscess vs rectocele vs cystocele vs uterine prolapse.   Co morbidities that complicate the patient evaluation  DVT HTN HLD DM   Additional history obtained:  Additional history obtained from family, at bedside External records from outside source obtained and reviewed including prior discharge summaries  Cardiac Monitoring:  The patient was maintained on a cardiac monitor.  I personally viewed and interpreted the cardiac monitored which showed an underlying rhythm of: NSR   Medicines ordered and prescription drug management:  I have reviewed the patients home medicines and have made adjustments as needed   Test Considered:  Pelvic US  - felt low yield   Problem List / ED Course:  Patient presenting to the emergency department as  she felt a pressure at her vaginal introitus and a subjective mass to this area.  She has a physical exam that is suggestive of mild rectocele.  No evidence of cystocele, uterine prolapse.  Also no other visible mass or abscess.  No indication for further emergent evaluation.  Stable for follow-up with OB/GYN.   Reevaluation:  After the interventions noted above, I reevaluated the patient and found that they have :stayed the same   Social Determinants of Health:  Good social support   Dispostion:  After consideration of the diagnostic results and the patients response to treatment, I  feel that the patent would benefit from outpatient OBGYN f/u PRN. Counseled on utility of Kegel's. Return precautions discussed and provided. Patient discharged in stable condition with no unaddressed concerns.       Final diagnoses:  Pelvic floor dysfunction    ED Discharge Orders     None          Keith Sor, PA-C 03/28/24 0100  "

## 2024-03-28 NOTE — ED Notes (Signed)
 Pt axox4. GCS 15. Pt verbalizes understanding of discharge instructions and follow up. Pt ambulated out of er with steady gait to transportation home with family and significant other

## 2024-03-29 ENCOUNTER — Other Ambulatory Visit: Payer: Self-pay

## 2024-03-29 ENCOUNTER — Ambulatory Visit: Admitting: Surgical

## 2024-03-29 DIAGNOSIS — M25511 Pain in right shoulder: Secondary | ICD-10-CM

## 2024-03-29 DIAGNOSIS — M19011 Primary osteoarthritis, right shoulder: Secondary | ICD-10-CM

## 2024-04-02 ENCOUNTER — Telehealth: Payer: Self-pay | Admitting: Nurse Practitioner

## 2024-04-02 ENCOUNTER — Encounter: Payer: Self-pay | Admitting: Nurse Practitioner

## 2024-04-02 NOTE — Telephone Encounter (Signed)
 Pt confirmed appt

## 2024-04-04 ENCOUNTER — Encounter: Payer: Self-pay | Admitting: Surgical

## 2024-04-04 MED ORDER — BUPIVACAINE HCL 0.25 % IJ SOLN
0.6600 mL | INTRAMUSCULAR | Status: AC | PRN
  Administered 2024-03-29: .66 mL via INTRA_ARTICULAR

## 2024-04-04 MED ORDER — METHYLPREDNISOLONE ACETATE 40 MG/ML IJ SUSP
20.0000 mg | INTRAMUSCULAR | Status: AC | PRN
  Administered 2024-03-29: 20 mg via INTRA_ARTICULAR

## 2024-04-04 MED ORDER — LIDOCAINE HCL 1 % IJ SOLN
3.0000 mL | INTRAMUSCULAR | Status: AC | PRN
  Administered 2024-03-29: 3 mL

## 2024-04-04 NOTE — Progress Notes (Signed)
" ° °  Procedure Note  Patient: Amanda Tran             Date of Birth: 1965/08/19           MRN: [id number]             Visit Date: 03/29/2024  Procedures: Visit Diagnoses:  1. Right shoulder pain, unspecified chronicity   2. Arthritis of right acromioclavicular joint     Medium Joint Inj: R acromioclavicular on 03/29/2024 4:16 PM Indications: diagnostic evaluation and pain Details: 25 G 1.5 in needle, ultrasound-guided superior approach Medications: 3 mL lidocaine  1 %; 0.66 mL bupivacaine  0.25 %; 20 mg methylPREDNISolone  acetate 40 MG/ML Outcome: tolerated well, no immediate complications Procedure, treatment alternatives, risks and benefits explained, specific risks discussed. Consent was given by the patient. Immediately prior to procedure a time out was called to verify the correct patient, procedure, equipment, support staff and site/side marked as required. Patient was prepped and draped in the usual sterile fashion.       Patient returns for final [facility] joint injection.  Last injection gave her relief for about 3 months.  We can do this 1 more time but if no resolution of symptoms after this, next up would be consideration of arthroscopic distal clavicle excision.  Patient agreed with plan.  Tolerated injection well.  Follow-up as needed if symptoms return. "

## 2024-04-05 ENCOUNTER — Ambulatory Visit: Attending: Nurse Practitioner | Admitting: Nurse Practitioner

## 2024-04-05 ENCOUNTER — Encounter: Payer: Self-pay | Admitting: Nurse Practitioner

## 2024-04-05 ENCOUNTER — Other Ambulatory Visit: Payer: Self-pay

## 2024-04-05 ENCOUNTER — Other Ambulatory Visit (HOSPITAL_COMMUNITY)
Admission: RE | Admit: 2024-04-05 | Discharge: 2024-04-05 | Disposition: A | Discharge status: Home / Self Care | Source: Ambulatory Visit | Attending: Nurse Practitioner | Admitting: Nurse Practitioner

## 2024-04-05 VITALS — BP 116/86 | HR 100 | Ht 61.0 in | Wt 203.8 lb

## 2024-04-05 DIAGNOSIS — J302 Other seasonal allergic rhinitis: Secondary | ICD-10-CM

## 2024-04-05 DIAGNOSIS — F5101 Primary insomnia: Secondary | ICD-10-CM

## 2024-04-05 DIAGNOSIS — G4733 Obstructive sleep apnea (adult) (pediatric): Secondary | ICD-10-CM

## 2024-04-05 DIAGNOSIS — E1165 Type 2 diabetes mellitus with hyperglycemia: Secondary | ICD-10-CM

## 2024-04-05 DIAGNOSIS — N760 Acute vaginitis: Secondary | ICD-10-CM | POA: Insufficient documentation

## 2024-04-05 DIAGNOSIS — M6289 Other specified disorders of muscle: Secondary | ICD-10-CM

## 2024-04-05 DIAGNOSIS — H538 Other visual disturbances: Secondary | ICD-10-CM

## 2024-04-05 DIAGNOSIS — Z7984 Long term (current) use of oral hypoglycemic drugs: Secondary | ICD-10-CM

## 2024-04-05 DIAGNOSIS — I10 Essential (primary) hypertension: Secondary | ICD-10-CM | POA: Diagnosis not present

## 2024-04-05 DIAGNOSIS — Z1231 Encounter for screening mammogram for malignant neoplasm of breast: Secondary | ICD-10-CM

## 2024-04-05 MED ORDER — LISINOPRIL 10 MG PO TABS
10.0000 mg | ORAL_TABLET | Freq: Every day | ORAL | 1 refills | Status: AC
  Filled 2024-04-05: qty 90, 90d supply, fill #0

## 2024-04-05 MED ORDER — CETIRIZINE HCL 10 MG PO TABS
10.0000 mg | ORAL_TABLET | Freq: Every day | ORAL | 3 refills | Status: AC
  Filled 2024-04-05: qty 90, 90d supply, fill #0

## 2024-04-05 MED ORDER — TRAZODONE HCL 50 MG PO TABS
25.0000 mg | ORAL_TABLET | Freq: Every evening | ORAL | 1 refills | Status: AC | PRN
  Filled 2024-04-05: qty 90, 90d supply, fill #0

## 2024-04-05 MED ORDER — AMLODIPINE BESYLATE 10 MG PO TABS
10.0000 mg | ORAL_TABLET | Freq: Every day | ORAL | 1 refills | Status: AC
  Filled 2024-04-05: qty 90, 90d supply, fill #0

## 2024-04-05 NOTE — Progress Notes (Addendum)
 "  Assessment & Plan:  Amanda Tran  was seen today for hypertension and vaginitis.  Diagnoses and all orders for this visit:  Primary hypertension -     CMP14+EGFR -     amLODipine  (NORVASC ) 10 MG tablet; Take 1 tablet (10 mg total) by mouth daily. -     lisinopril  (ZESTRIL ) 10 MG tablet; Take 1 tablet (10 mg total) by mouth daily. Continue all antihypertensives as prescribed.  Reminded to bring in blood pressure log for follow  up appointment.  RECOMMENDATIONS: DASH/Mediterranean Diets are healthier choices for HTN.    Seasonal allergies -     cetirizine  (ZYRTEC  ALLERGY) 10 MG tablet; Take 1 tablet (10 mg total) by mouth daily.  Type 2 diabetes mellitus with hyperglycemia, without long-term current use of insulin (HCC) -     Hemoglobin A1c -     CMP14+EGFR -     lisinopril  (ZESTRIL ) 10 MG tablet; Take 1 tablet (10 mg total) by mouth daily. Continue blood sugar control as discussed in office today, low carbohydrate diet, and regular physical exercise as tolerated, 150 minutes per week (30 min each day, 5 days per week, or 50 min 3 days per week). Keep blood sugar logs with fasting goal of 90-130 mg/dl, post prandial (after you eat) less than 180.  For Hypoglycemia: BS <60 and Hyperglycemia BS >400; contact the clinic ASAP. Annual eye exams and foot exams are recommended.   Primary insomnia -     traZODone  (DESYREL ) 50 MG tablet; Take 0.5-1 tablets (25-50 mg total) by mouth at bedtime as needed for sleep.  Pelvic floor dysfunction -     Ambulatory referral to Gynecology Recommend kegel exercises  Acute vaginitis -     Cervicovaginal ancillary only -     Urinalysis, Complete  OSA (obstructive sleep apnea) -     Home sleep test; Future  Breast cancer screening by mammogram -     MM 3D SCREENING MAMMOGRAM BILATERAL BREAST; Future  Blurred vision -     Ambulatory referral to Ophthalmology    Patient has been counseled on age-appropriate routine health concerns for screening and  prevention. These are reviewed and up-to-date. Referrals have been placed accordingly. Immunizations are up-to-date or declined.    Subjective:   Chief Complaint  Patient presents with   Hypertension   Vaginitis    Amanda  L Tran 59 y.o. female presents to office today for follow up to HTN, vaginitis and OSA.  She has a past medical history of Anemia, Anxiety, DM 2, DVT (03/11/2012), Hyperlipidemia, Hypertension, and Vitamin D  deficiency.     HTN Blood pressure at goal with amlodipine  10 mg daily and lisinopril  10 mg daily. BP Readings from Last 3 Encounters:  04/05/24 116/86  03/28/24 138/88  12/03/23 107/72    She was recently diagnosed with pelvic floor dysfunction and possible rectocele.  Needs to be referred to gynecology for further recommendations and evaluation. Describing a sensation of organs 'hanging' and pushing through her vaginal area, with a feeling of her rectum coming through the area of her vagina from the back. She has not engaged in pelvic floor exercises such as Kegels.  She reports current symptoms suggestive of a urinary tract infection, including a strong urine smell. She mentions a change in sexual partners, stating she waited a while after her last partner before engaging with a new one after being diagnosed and treated for trichomonas.  She experiences snoring and difficulty sleeping, with symptoms of waking up frequently at night and  body temperature changes, which she attributes to menopause. She uses fans to manage her body temperature at night. Her partner has noted loud snoring, especially when the fans are off, but she does not report witnessed apneic episodes. She also wakes up feeling tired and sluggish throughout the day.      Review of Systems  Constitutional:  Negative for fever, malaise/fatigue and weight loss.  HENT: Negative.  Negative for nosebleeds.   Eyes:  Positive for blurred vision. Negative for double vision and photophobia.   Respiratory: Negative.  Negative for cough and shortness of breath.   Cardiovascular: Negative.  Negative for chest pain, palpitations and leg swelling.  Gastrointestinal:  Positive for abdominal pain. Negative for heartburn, nausea and vomiting.  Genitourinary:        SEE HPI  Musculoskeletal: Negative.  Negative for myalgias.  Neurological: Negative.  Negative for dizziness, focal weakness, seizures and headaches.  Endo/Heme/Allergies:  Positive for environmental allergies.  Psychiatric/Behavioral:  Negative for suicidal ideas. The patient has insomnia.     Past Medical History:  Diagnosis Date   Anemia    Anxiety    Diabetes (HCC)    Diabetes mellitus (HCC) 04/23/2023   DVT (deep venous thrombosis) (HCC) 03/11/2012   Left popliteal    Hyperlipidemia    Hypertension    Vitamin D  deficiency     Past Surgical History:  Procedure Laterality Date   CESAREAN SECTION     1990, 1993.1994, 1996   COLONOSCOPY     TUBAL LIGATION      Family History  Problem Relation Age of Onset   Kidney failure Mother    Hypertension Father    Diabetes Father    Diabetes Sister    Colon cancer Neg Hx    Esophageal cancer Neg Hx    Stomach cancer Neg Hx    Rectal cancer Neg Hx    Breast cancer Neg Hx    Colon polyps Neg Hx     Social History Reviewed with no changes to be made today.   Outpatient Medications Prior to Visit  Medication Sig Dispense Refill   acetaminophen -codeine  (TYLENOL  #3) 300-30 MG tablet Take 1 tablet by mouth daily as needed for moderate pain (pain score 4-6). 30 tablet 0   albuterol  (VENTOLIN  HFA) 108 (90 Base) MCG/ACT inhaler Inhale 2 puffs into the lungs every 6 (six) hours as needed for wheezing or shortness of breath. 6.7 g 1   atorvastatin  (LIPITOR) 20 MG tablet Take 1 tablet (20 mg total) by mouth daily. 90 tablet 2   betamethasone  dipropionate 0.05 % cream Apply 1 Application topically 2 (two) times daily as needed TO THE AFFECTED AREA(S) OF LEGS. DO NOT  APPLY TO FACE, GROIN, UNDERARMS. 50 g 3   Blood Glucose Monitoring Suppl (TRUE METRIX METER) w/Device KIT Use as instructed to heck blood glucose level by fingerstick 3 times per day. 1 kit 0   glimepiride  (AMARYL ) 4 MG tablet Take 2 tablets (8 mg total) by mouth daily with breakfast. 180 tablet 1   glucose blood (TRUE METRIX BLOOD GLUCOSE TEST) test strip Use as instructed to check blood glucose levels by fingerstick 3 times per day. 100 each 12   meloxicam  (MOBIC ) 15 MG tablet Take 1 tablet (15 mg total) by mouth daily. 21 tablet 0   mupirocin  ointment (BACTROBAN ) 2 % Apply 1 Application topically 2 (two) times daily. 66 g 10   Semaglutide , 2 MG/DOSE, (OZEMPIC , 2 MG/DOSE,) 8 MG/3ML SOPN Inject 2 mg as directed  once a week. 3 mL 6   TRUEplus Lancets 28G MISC Use as instructed to check blood glucose level by fingerstick 3 times per day. 100 each 3   amLODipine  (NORVASC ) 10 MG tablet Take 1 tablet (10 mg total) by mouth daily. 90 tablet 1   cetirizine  (ZYRTEC  ALLERGY) 10 MG tablet Take 1 tablet (10 mg total) by mouth daily. 90 tablet 3   lisinopril  (ZESTRIL ) 10 MG tablet Take 1 tablet (10 mg total) by mouth daily. 90 tablet 1   traZODone  (DESYREL ) 50 MG tablet Take 0.5-1 tablets (25-50 mg total) by mouth at bedtime as needed for sleep. 90 tablet 1   triamcinolone  cream (KENALOG ) 0.1 % Apply 1 application topically daily until resolved. (Patient not taking: Reported on 04/05/2024) 45 g 0   No facility-administered medications prior to visit.    Allergies[1]     Objective:    BP 116/86 (BP Location: Left Arm, Patient Position: Sitting, Cuff Size: Normal)   Pulse 100   Ht 5' 1 (1.549 m)   Wt 203 lb 12.8 oz (92.4 kg)   LMP  (LMP Unknown)   SpO2 98%   BMI 38.51 kg/m  Wt Readings from Last 3 Encounters:  04/05/24 203 lb 12.8 oz (92.4 kg)  03/28/24 204 lb 12.9 oz (92.9 kg)  12/03/23 204 lb 12.8 oz (92.9 kg)    Physical Exam Vitals and nursing note reviewed.  Constitutional:       Appearance: She is well-developed.  HENT:     Head: Normocephalic and atraumatic.  Cardiovascular:     Rate and Rhythm: Normal rate and regular rhythm.     Heart sounds: Normal heart sounds. No murmur heard.    No friction rub. No gallop.  Pulmonary:     Effort: Pulmonary effort is normal. No tachypnea or respiratory distress.     Breath sounds: Normal breath sounds. No decreased breath sounds, wheezing, rhonchi or rales.  Chest:     Chest wall: No tenderness.  Musculoskeletal:        General: Normal range of motion.     Cervical back: Normal range of motion.  Skin:    General: Skin is warm and dry.  Neurological:     Mental Status: She is alert and oriented to person, place, and time.     Coordination: Coordination normal.  Psychiatric:        Behavior: Behavior normal. Behavior is cooperative.        Thought Content: Thought content normal.        Judgment: Judgment normal.          Patient has been counseled extensively about nutrition and exercise as well as the importance of adherence with medications and regular follow-up. The patient was given clear instructions to go to ER or return to medical center if symptoms don't improve, worsen or new problems develop. The patient verbalized understanding.   Follow-up: Return in about 4 months (around 08/03/2024).   Haze LELON Servant, FNP-BC [facility] [place], [place] [phone]   04/05/2024, 4:22 PM     [1] No Known Allergies  "

## 2024-04-05 NOTE — Progress Notes (Signed)
Patient requesting a sleep study

## 2024-04-05 NOTE — Patient Instructions (Signed)
 [facility] at [facility] clinic in [place], [place]  Address: [address] [place], [place], [place] [address] Phone: [phone]

## 2024-04-06 ENCOUNTER — Ambulatory Visit: Payer: Self-pay | Admitting: Nurse Practitioner

## 2024-04-06 ENCOUNTER — Encounter: Attending: Nurse Practitioner | Admitting: Skilled Nursing Facility1

## 2024-04-06 ENCOUNTER — Encounter: Payer: Self-pay | Admitting: Nurse Practitioner

## 2024-04-06 DIAGNOSIS — E119 Type 2 diabetes mellitus without complications: Secondary | ICD-10-CM | POA: Insufficient documentation

## 2024-04-06 DIAGNOSIS — B9689 Other specified bacterial agents as the cause of diseases classified elsewhere: Secondary | ICD-10-CM

## 2024-04-06 DIAGNOSIS — B3731 Acute candidiasis of vulva and vagina: Secondary | ICD-10-CM

## 2024-04-06 LAB — CMP14+EGFR
ALT: 25 IU/L (ref 0–32)
AST: 21 IU/L (ref 0–40)
Albumin: 4.6 g/dL (ref 3.8–4.9)
Alkaline Phosphatase: 148 IU/L — ABNORMAL HIGH (ref 49–135)
BUN/Creatinine Ratio: 19 (ref 9–23)
BUN: 18 mg/dL (ref 6–24)
Bilirubin Total: 0.3 mg/dL (ref 0.0–1.2)
CO2: 23 mmol/L (ref 20–29)
Calcium: 9.6 mg/dL (ref 8.7–10.2)
Chloride: 103 mmol/L (ref 96–106)
Creatinine, Ser: 0.97 mg/dL (ref 0.57–1.00)
Globulin, Total: 3.1 g/dL (ref 1.5–4.5)
Glucose: 69 mg/dL — ABNORMAL LOW (ref 70–99)
Potassium: 4 mmol/L (ref 3.5–5.2)
Sodium: 144 mmol/L (ref 134–144)
Total Protein: 7.7 g/dL (ref 6.0–8.5)
eGFR: 69 mL/min/1.73

## 2024-04-06 LAB — URINALYSIS, COMPLETE
Bilirubin, UA: NEGATIVE
Ketones, UA: NEGATIVE
Leukocytes,UA: NEGATIVE
Nitrite, UA: NEGATIVE
Protein,UA: NEGATIVE
RBC, UA: NEGATIVE
Specific Gravity, UA: 1.026 (ref 1.005–1.030)
Urobilinogen, Ur: 0.2 mg/dL (ref 0.2–1.0)
pH, UA: 5.5 (ref 5.0–7.5)

## 2024-04-06 LAB — MICROSCOPIC EXAMINATION
Bacteria, UA: NONE SEEN
Casts: NONE SEEN /LPF
WBC, UA: NONE SEEN /HPF (ref 0–5)

## 2024-04-06 LAB — HEMOGLOBIN A1C
Est. average glucose Bld gHb Est-mCnc: 131 mg/dL
Hgb A1c MFr Bld: 6.2 % — ABNORMAL HIGH (ref 4.8–5.6)

## 2024-04-06 NOTE — Progress Notes (Unsigned)
 DM medications: Ozempic    Appt time: 4pm End time: 4:25pm  Lifestyle Hx  No changes in medication or hospital stays  Pt states she is doing better to eat small and frequent meals. Staying around the 100s Pt experienced a low at 69 yesterday after a very busy day.  Pt states her A1c was 6.2. Pt states she knows it was the holidays and she ate more than usual.  Pt has experienced wt loss and has been eating  Pt states she is feeling better, more energized, less drained.  Pt states she is walking more and plans to do it more often.  Pt states she is experiencing soda cravings.  Pt states she is feeling less fear surrounding eating and her blood sugar.  Pt states she feels excitement around checking her blood sugar.  Pt states she uses her air fryer often. She eats broccoli and other veggies once a day.  Pt states she tried seafood and that she really enjoyed it.  Pt asked questions about soda and sweet intake.  Pt states she has not told her doctor about her low blood sugar.   24 hour Dietary Recall First Meal: Bowl of grits with bacon Snack: Second Meal: chicken salad sandwich with pickle Snack: Third Meal: air fried chicken tenderloins with garlic bread  Intervention Dietitian educated pt on how blood sugar lows will drag the A1c average down. Dietitian educated pt on how to eat proper sized meals to reduce risk of overindulging.  Dietitian educated pt on how consistent blood sugar readings will help reduce cravings for soda and sweetened beverages.  Dietitian educated pt on benefits of walking and movement.  Dietitian educated pt on need for adequate vegetable intake and their effects on the body.  Dietitian educated pt on cooking techniques for fish and the health benefits of fish.  Dietitian educated pt on soda and sugar intake and portion sizes throughout the week.  Dietitian educated pt on ultra processed foods.  Dietitian educated pt on low blood sugar and the risks  associated.   Goals 1) Work on incorporating more vegetables. 2) Continue walking.  3) Try to cook more types of seafood and experiment with sauces and different flavor profiles. 4) Continue monitoring your blood sugar and keeping snacks on hand to help reduce risk of lows.   Next Steps: Pt will follow up in 3 months.

## 2024-04-07 ENCOUNTER — Other Ambulatory Visit: Payer: Self-pay

## 2024-04-07 LAB — CERVICOVAGINAL ANCILLARY ONLY
Bacterial Vaginitis (gardnerella): POSITIVE — AB
Candida Glabrata: POSITIVE — AB
Candida Vaginitis: NEGATIVE
Chlamydia: NEGATIVE
Comment: NEGATIVE
Comment: NEGATIVE
Comment: NEGATIVE
Comment: NEGATIVE
Comment: NEGATIVE
Comment: NORMAL
Neisseria Gonorrhea: NEGATIVE
Trichomonas: NEGATIVE

## 2024-04-08 ENCOUNTER — Other Ambulatory Visit: Payer: Self-pay

## 2024-04-10 ENCOUNTER — Other Ambulatory Visit (HOSPITAL_COMMUNITY): Payer: Self-pay

## 2024-04-10 ENCOUNTER — Other Ambulatory Visit: Payer: Self-pay

## 2024-04-10 MED ORDER — METRONIDAZOLE 500 MG PO TABS
500.0000 mg | ORAL_TABLET | Freq: Two times a day (BID) | ORAL | 0 refills | Status: DC
  Filled 2024-04-10 (×2): qty 14, 7d supply, fill #0

## 2024-04-10 MED ORDER — FLUCONAZOLE 150 MG PO TABS
150.0000 mg | ORAL_TABLET | ORAL | 0 refills | Status: AC | PRN
  Filled 2024-04-10 (×2): qty 2, 6d supply, fill #0

## 2024-04-13 ENCOUNTER — Other Ambulatory Visit: Payer: Self-pay

## 2024-04-13 ENCOUNTER — Other Ambulatory Visit: Payer: Self-pay | Admitting: Nurse Practitioner

## 2024-04-13 DIAGNOSIS — B9689 Other specified bacterial agents as the cause of diseases classified elsewhere: Secondary | ICD-10-CM

## 2024-04-13 MED ORDER — METRONIDAZOLE 0.75 % VA GEL
1.0000 | Freq: Two times a day (BID) | VAGINAL | 0 refills | Status: AC
  Filled 2024-04-13: qty 70, 5d supply, fill #0

## 2024-04-23 ENCOUNTER — Ambulatory Visit
Admission: RE | Admit: 2024-04-23 | Discharge: 2024-04-23 | Disposition: A | Source: Ambulatory Visit | Attending: Nurse Practitioner

## 2024-04-23 DIAGNOSIS — Z1231 Encounter for screening mammogram for malignant neoplasm of breast: Secondary | ICD-10-CM

## 2024-04-27 ENCOUNTER — Emergency Department (HOSPITAL_COMMUNITY)

## 2024-04-27 ENCOUNTER — Other Ambulatory Visit: Payer: Self-pay

## 2024-04-27 ENCOUNTER — Encounter (HOSPITAL_COMMUNITY): Payer: Self-pay

## 2024-04-27 ENCOUNTER — Emergency Department (HOSPITAL_COMMUNITY)
Admission: EM | Admit: 2024-04-27 | Discharge: 2024-04-27 | Disposition: A | Discharge status: Home / Self Care | Attending: Emergency Medicine | Admitting: Emergency Medicine

## 2024-04-27 DIAGNOSIS — Z79899 Other long term (current) drug therapy: Secondary | ICD-10-CM | POA: Insufficient documentation

## 2024-04-27 DIAGNOSIS — E119 Type 2 diabetes mellitus without complications: Secondary | ICD-10-CM | POA: Insufficient documentation

## 2024-04-27 DIAGNOSIS — Z7984 Long term (current) use of oral hypoglycemic drugs: Secondary | ICD-10-CM | POA: Insufficient documentation

## 2024-04-27 DIAGNOSIS — R112 Nausea with vomiting, unspecified: Secondary | ICD-10-CM | POA: Insufficient documentation

## 2024-04-27 DIAGNOSIS — I10 Essential (primary) hypertension: Secondary | ICD-10-CM | POA: Insufficient documentation

## 2024-04-27 DIAGNOSIS — J45909 Unspecified asthma, uncomplicated: Secondary | ICD-10-CM | POA: Insufficient documentation

## 2024-04-27 DIAGNOSIS — R1084 Generalized abdominal pain: Secondary | ICD-10-CM | POA: Insufficient documentation

## 2024-04-27 LAB — CBC
HCT: 47.9 % — ABNORMAL HIGH (ref 36.0–46.0)
Hemoglobin: 15.2 g/dL — ABNORMAL HIGH (ref 12.0–15.0)
MCH: 26.5 pg (ref 26.0–34.0)
MCHC: 31.7 g/dL (ref 30.0–36.0)
MCV: 83.4 fL (ref 80.0–100.0)
Platelets: 205 10*3/uL (ref 150–400)
RBC: 5.74 MIL/uL — ABNORMAL HIGH (ref 3.87–5.11)
RDW: 14.2 % (ref 11.5–15.5)
WBC: 6.3 10*3/uL (ref 4.0–10.5)
nRBC: 0 % (ref 0.0–0.2)

## 2024-04-27 LAB — URINALYSIS, ROUTINE W REFLEX MICROSCOPIC
Bacteria, UA: NONE SEEN
Bilirubin Urine: NEGATIVE
Glucose, UA: NEGATIVE mg/dL
Ketones, ur: NEGATIVE mg/dL
Leukocytes,Ua: NEGATIVE
Nitrite: NEGATIVE
Protein, ur: NEGATIVE mg/dL
Specific Gravity, Urine: 1.023 (ref 1.005–1.030)
pH: 5 (ref 5.0–8.0)

## 2024-04-27 LAB — COMPREHENSIVE METABOLIC PANEL WITH GFR
ALT: 32 U/L (ref 0–44)
AST: 45 U/L — ABNORMAL HIGH (ref 15–41)
Albumin: 4.3 g/dL (ref 3.5–5.0)
Alkaline Phosphatase: 133 U/L — ABNORMAL HIGH (ref 38–126)
Anion gap: 13 (ref 5–15)
BUN: 10 mg/dL (ref 6–20)
CO2: 23 mmol/L (ref 22–32)
Calcium: 9.4 mg/dL (ref 8.9–10.3)
Chloride: 105 mmol/L (ref 98–111)
Creatinine, Ser: 0.8 mg/dL (ref 0.44–1.00)
GFR, Estimated: >60 mL/min
Glucose, Bld: 122 mg/dL — ABNORMAL HIGH (ref 70–99)
Potassium: 4.4 mmol/L (ref 3.5–5.1)
Sodium: 141 mmol/L (ref 135–145)
Total Bilirubin: 0.6 mg/dL (ref 0.0–1.2)
Total Protein: 8.1 g/dL (ref 6.5–8.1)

## 2024-04-27 LAB — LIPASE, BLOOD: Lipase: 22 U/L (ref 11–51)

## 2024-04-27 LAB — RESP PANEL BY RT-PCR (RSV, FLU A&B, COVID)  RVPGX2
Influenza A by PCR: NEGATIVE
Influenza B by PCR: NEGATIVE
Resp Syncytial Virus by PCR: NEGATIVE
SARS Coronavirus 2 by RT PCR: NEGATIVE

## 2024-04-27 MED ORDER — ALBUTEROL SULFATE HFA 108 (90 BASE) MCG/ACT IN AERS
2.0000 | INHALATION_SPRAY | Freq: Once | RESPIRATORY_TRACT | Status: AC
  Administered 2024-04-27: 2 via RESPIRATORY_TRACT
  Filled 2024-04-27: qty 6.7

## 2024-04-27 MED ORDER — MORPHINE SULFATE (PF) 4 MG/ML IV SOLN
4.0000 mg | Freq: Once | INTRAVENOUS | Status: AC
  Administered 2024-04-27: 4 mg via INTRAVENOUS
  Filled 2024-04-27: qty 1

## 2024-04-27 MED ORDER — LACTATED RINGERS IV BOLUS
1000.0000 mL | Freq: Once | INTRAVENOUS | Status: AC
  Administered 2024-04-27: 1000 mL via INTRAVENOUS

## 2024-04-27 MED ORDER — ONDANSETRON 4 MG PO TBDP: 4.0000 mg | ORAL_TABLET | Freq: Three times a day (TID) | ORAL | 0 refills | Status: AC | PRN

## 2024-04-27 MED ORDER — IOHEXOL 300 MG/ML  SOLN
100.0000 mL | Freq: Once | INTRAMUSCULAR | Status: AC | PRN
  Administered 2024-04-27: 100 mL via INTRAVENOUS

## 2024-04-27 MED ORDER — ONDANSETRON HCL 4 MG/2ML IJ SOLN
4.0000 mg | Freq: Once | INTRAMUSCULAR | Status: AC
  Administered 2024-04-27: 4 mg via INTRAVENOUS
  Filled 2024-04-27: qty 2

## 2024-04-27 NOTE — ED Notes (Signed)
 Patient transported to CT

## 2024-04-27 NOTE — ED Triage Notes (Signed)
 Patient has been vomiting all day. Feels that she has a fever. Has generalized abdominal pain. Has upper back.

## 2024-04-29 ENCOUNTER — Encounter: Payer: Self-pay | Admitting: Nurse Practitioner

## 2024-04-30 ENCOUNTER — Other Ambulatory Visit: Payer: Self-pay

## 2024-04-30 ENCOUNTER — Emergency Department (HOSPITAL_COMMUNITY)
Admission: EM | Admit: 2024-04-30 | Discharge: 2024-04-30 | Disposition: A | Discharge status: Home / Self Care | Source: Home / Self Care | Attending: Emergency Medicine | Admitting: Emergency Medicine

## 2024-04-30 ENCOUNTER — Emergency Department (HOSPITAL_COMMUNITY)

## 2024-04-30 ENCOUNTER — Encounter (HOSPITAL_COMMUNITY): Payer: Self-pay

## 2024-04-30 DIAGNOSIS — S8255XA Nondisplaced fracture of medial malleolus of left tibia, initial encounter for closed fracture: Secondary | ICD-10-CM

## 2024-04-30 DIAGNOSIS — S8265XA Nondisplaced fracture of lateral malleolus of left fibula, initial encounter for closed fracture: Secondary | ICD-10-CM

## 2024-04-30 MED ORDER — FENTANYL CITRATE (PF) 50 MCG/ML IJ SOSY
50.0000 ug | PREFILLED_SYRINGE | Freq: Once | INTRAMUSCULAR | Status: AC
  Administered 2024-04-30: 50 ug via INTRAVENOUS
  Filled 2024-04-30: qty 1

## 2024-04-30 MED ORDER — OXYCODONE HCL 5 MG PO TABS: 5.0000 mg | ORAL_TABLET | Freq: Four times a day (QID) | ORAL | 0 refills | Status: AC | PRN

## 2024-04-30 MED ORDER — ONDANSETRON HCL 4 MG/2ML IJ SOLN
4.0000 mg | Freq: Once | INTRAMUSCULAR | Status: AC
  Administered 2024-04-30: 4 mg via INTRAVENOUS
  Filled 2024-04-30: qty 2

## 2024-04-30 NOTE — Progress Notes (Signed)
 Orthopedic Tech Progress Note Patient Details:  Amanda Tran [birth year] [id number]  Ortho Devices Type of Ortho Device: Short leg splint Ortho Device/Splint Location: LLE Ortho Device/Splint Interventions: Ordered, Application   Post Interventions Patient Tolerated: Well Instructions Provided: Care of device, Poper ambulation with device  [name] A Danise Dehne 04/30/2024, 3:17 PM

## 2024-04-30 NOTE — ED Triage Notes (Signed)
 Patient reports fell on ice and has left ankle injury with swelling and deformity.

## 2024-04-30 NOTE — ED Notes (Signed)
 PTAR at bedside

## 2024-04-30 NOTE — ED Notes (Signed)
 Ortho tech paged

## 2024-04-30 NOTE — Discharge Instructions (Addendum)
 Please follow-up with orthopedic provider listed in this discharge paperwork.  You will need to call their office today and tell them that you were seen in the emergency department and need a follow-up appointment with them.  Please also follow-up with primary care.  Please remain nonweightbearing on this left leg.  Seek emergency care if experiencing new or worsening symptoms.  Alternating between 650 mg Tylenol  and 400 mg Advil : The best way to alternate taking Acetaminophen  (example Tylenol ) and Ibuprofen  (example Advil /Motrin ) is to take them 3 hours apart. For example, if you take ibuprofen  at 6 am you can then take Tylenol  at 9 am. You can continue this regimen throughout the day, making sure you do not exceed the recommended maximum dose for each drug.

## 2024-04-30 NOTE — ED Provider Notes (Cosign Needed)
 " [facility] Provider Note   CSN: [id number] Arrival date & time: 04/30/24  9193     Patient presents with: Ankle Pain and Fall   Amanda Tran is a 59 y.o. female with PMHx DM, anemia, HLD, HTN who presents to the ED concerned for left ankle pain. Symptoms started after patient slipped and fell on ice earlier this morning. Patient endorses having good sensation in her foot. Has not taken anything for pain yet.   Denies head trauma, LOC, seizure, blood thinner.     Ankle Pain Fall       Prior to Admission medications  Medication Sig Start Date End Date Taking? Authorizing Provider  oxyCODONE  (ROXICODONE ) 5 MG immediate release tablet Take 1 tablet (5 mg total) by mouth every 6 (six) hours as needed for up to 10 doses for breakthrough pain. 04/30/24  Yes Hoy Fraction F, PA-C  acetaminophen -codeine  (TYLENOL  #3) 300-30 MG tablet Take 1 tablet by mouth daily as needed for moderate pain (pain score 4-6). 12/23/23   Magnant, Charles L, PA-C  albuterol  (VENTOLIN  HFA) 108 (90 Base) MCG/ACT inhaler Inhale 2 puffs into the lungs every 6 (six) hours as needed for wheezing or shortness of breath. 09/02/23   Fleming, Zelda W, NP  amLODipine  (NORVASC ) 10 MG tablet Take 1 tablet (10 mg total) by mouth daily. 04/05/24   Fleming, Zelda W, NP  atorvastatin  (LIPITOR) 20 MG tablet Take 1 tablet (20 mg total) by mouth daily. 09/02/23   Fleming, Zelda W, NP  betamethasone  dipropionate 0.05 % cream Apply 1 Application topically 2 (two) times daily as needed TO THE AFFECTED AREA(S) OF LEGS. DO NOT APPLY TO FACE, GROIN, UNDERARMS. 12/16/23   Shona Rush, MD  Blood Glucose Monitoring Suppl (TRUE METRIX METER) w/Device KIT Use as instructed to heck blood glucose level by fingerstick 3 times per day. 10/21/22   Fleming, Zelda W, NP  cetirizine  (ZYRTEC  ALLERGY) 10 MG tablet Take 1 tablet (10 mg total) by mouth daily. 04/05/24   Fleming, Zelda W, NP  fluconazole   (DIFLUCAN ) 150 MG tablet Take 1 tablet (150 mg total) by mouth every three (3) days as needed. 04/10/24   Fleming, Zelda W, NP  glimepiride  (AMARYL ) 4 MG tablet Take 2 tablets (8 mg total) by mouth daily with breakfast. 12/03/23   Fleming, Zelda W, NP  glucose blood (TRUE METRIX BLOOD GLUCOSE TEST) test strip Use as instructed to check blood glucose levels by fingerstick 3 times per day. 10/20/23   Fleming, Zelda W, NP  lisinopril  (ZESTRIL ) 10 MG tablet Take 1 tablet (10 mg total) by mouth daily. 04/05/24   Fleming, Zelda W, NP  meloxicam  (MOBIC ) 15 MG tablet Take 1 tablet (15 mg total) by mouth daily. 03/12/24   Magnant, Carlin CROME, PA-C  metroNIDAZOLE  (METROGEL ) 0.75 % vaginal gel Place 1 Applicatorful vaginally 2 (two) times daily. 04/13/24   Fleming, Zelda W, NP  mupirocin  ointment (BACTROBAN ) 2 % Apply 1 Application topically 2 (two) times daily. 12/18/23   Newlin, Enobong, MD  ondansetron  (ZOFRAN -ODT) 4 MG disintegrating tablet Take 1 tablet (4 mg total) by mouth every 8 (eight) hours as needed for nausea or vomiting. 04/27/24   Curatolo, Adam, DO  Semaglutide , 2 MG/DOSE, (OZEMPIC , 2 MG/DOSE,) 8 MG/3ML SOPN Inject 2 mg as directed once a week. 03/12/24   Fleming, Zelda W, NP  traZODone  (DESYREL ) 50 MG tablet Take 0.5-1 tablets (25-50 mg total) by mouth at bedtime as needed for sleep. 04/05/24  Fleming, Zelda W, NP  triamcinolone  cream (KENALOG ) 0.1 % Apply 1 application topically daily until resolved. Patient not taking: Reported on 04/05/2024 08/25/23   Newlin, Enobong, MD  TRUEplus Lancets 28G MISC Use as instructed to check blood glucose level by fingerstick 3 times per day. 06/02/23   Fleming, Zelda W, NP    Allergies: Patient has no known allergies.    Review of Systems  Musculoskeletal:        Ankle pain    Updated Vital Signs BP 126/81 (BP Location: Left Arm)   Pulse 91   Temp (!) 97.4 F (36.3 C) (Tympanic)   Resp 18   Ht 5' 1 (1.549 m)   Wt 91.6 kg   LMP  (LMP Unknown)   SpO2 97%    BMI 38.16 kg/m   Physical Exam Vitals and nursing note reviewed.  Constitutional:      General: She is not in acute distress.    Appearance: She is not ill-appearing or toxic-appearing.  HENT:     Head: Normocephalic and atraumatic.  Eyes:     General: No scleral icterus.       Right eye: No discharge.        Left eye: No discharge.     Conjunctiva/sclera: Conjunctivae normal.  Cardiovascular:     Rate and Rhythm: Normal rate.  Pulmonary:     Effort: Pulmonary effort is normal.  Abdominal:     General: Abdomen is flat.  Musculoskeletal:     Comments: Left ankle: mild deformity. +2 pedal pulses. Sensation to light touch intact. Area non-tense. Superficial abrasion on medial ankle. Hemostatic.  Skin:    General: Skin is warm and dry.  Neurological:     General: No focal deficit present.     Mental Status: She is alert and oriented to person, place, and time. Mental status is at baseline.  Psychiatric:        Mood and Affect: Mood normal.        Behavior: Behavior normal.     (all labs ordered are listed, but only abnormal results are displayed) Labs Reviewed - No data to display  EKG: None  Radiology: CT Ankle Left Wo Contrast Result Date: 04/30/2024 EXAM: CT LEFT ANKLE, WITHOUT IV CONTRAST 04/30/2024 10:44:45 AM TECHNIQUE: Axial images were acquired through the left ankle without IV contrast. Reformatted images were reviewed. Automated exposure control, iterative reconstruction, and/or weight based adjustment of the mA/kV was utilized to reduce the radiation dose to as low as reasonably achievable. COMPARISON: None provided. CLINICAL HISTORY: Ankle trauma, fracture, X-ray done (age >= 5y). FINDINGS: BONES: Stage 4 Weber B fracture pattern observed. The oblique lateral malleolar fracture has mild comminution, especially distally. There is likewise mild comminution along the articular surface margin of the oblique posterior malleolar fracture. Transverse medial malleolar  fracture noted. Small ossicles are present just below the medial malleolus, appearing well corticated. A 0.5 cm well corticated osteochondral lesion is seen anteriorly in the tibiotalar joint on image 21 series 7, which may reflect a free osteochondral fragment. Plantar and Achilles calcaneal spurs are present. Mild degenerative subcortical cystic lesions are noted along the plantar margin of the medial cuneiform distally. JOINTS: Tibiotalar alignment has been restored. No dislocation. The joint spaces are normal. A 0.5 cm well corticated osteochondral lesion is seen anteriorly in the tibiotalar joint on image 21 series 7, which may reflect a free osteochondral fragment. SOFT TISSUES: No flexor tendon entrapment. Subcutaneous edema noted dorsally, medially, and laterally along the  ankle. IMPRESSION: 1. Stage 4 Weber B fracture pattern with mild comminution of the oblique lateral and posterior malleolar fractures, with restored tibiotalar alignment. 2. Transverse medial malleolar fracture, with small well-corticated ossicles just inferior to the medial malleolus. 3. Possible 0.5 cm well-corticated free osteochondral fragment in the anterior tibiotalar joint. 4. Subcutaneous edema dorsally, medially, and laterally about the ankle. 5. Plantar and Achilles calcaneal spurs, with mild degenerative subcortical cystic change along the plantar margin of the distal medial cuneiform. Electronically signed by: Ryan Salvage MD 04/30/2024 11:06 AM EST RP Workstation: [id number]   DG Tibia/Fibula Left Result Date: 04/30/2024 EXAM: 4 VIEW(S) XRAY OF THE LEFT TIBIA AND FIBULA 04/30/2024 09:05:00 AM COMPARISON: Left ankle series reported separately on 04/30/2024. CLINICAL HISTORY: 59 year old female. Fell on ice. FINDINGS: Left trimalleolar ankle fracture; see separately reported left ankle series. No other acute osseous abnormality. Maintained alignment at the left knee. IMPRESSION: 1. Left trimalleolar ankle fracture; see  separately reported left ankle series. 2. No other acute fracture of the left tibia and fibula. Electronically signed by: Helayne Hurst MD 04/30/2024 09:46 AM EST RP Workstation: [id number]   DG Ankle Complete Left Result Date: 04/30/2024 EXAM: 3 VIEW(S) XRAY OF THE LEFT ANKLE 04/30/2024 09:05:00 AM CLINICAL HISTORY: 59 year old female. Fell on ice. COMPARISON: Left ankle series 02/12/2008. FINDINGS: BONES AND JOINTS: Trimalleolar fracture, consisting of a comminuted and displaced transverse fracture of the medial malleolus, a displaced oblique fracture of the distal fibula, and a displaced fracture of the posterior malleolus. Ankle mortise widening. Posterior and plantar calcaneal spur. SOFT TISSUES: Subcutaneous soft tissue edema. IMPRESSION: 1. Displaced left ankle trimalleolar fracture with ankle mortise widening. 2. Associated left ankle soft tissue swelling. Electronically signed by: Helayne Hurst MD 04/30/2024 09:44 AM EST RP Workstation: [id number]     Procedures   Medications Ordered in the ED  fentaNYL  (SUBLIMAZE ) injection 50 mcg (50 mcg Intravenous Given 04/30/24 0844)  ondansetron  (ZOFRAN ) injection 4 mg (4 mg Intravenous Given 04/30/24 0846)  fentaNYL  (SUBLIMAZE ) injection 50 mcg (50 mcg Intravenous Given 04/30/24 1143)                                    Medical Decision Making Amount and/or Complexity of Data Reviewed Radiology: ordered.  Risk Prescription drug management.   This patient presents to the ED for concern of ankle pain, this involves an extensive number of treatment options, and is a complaint that carries with it a high risk of complications and morbidity.  The differential diagnosis includes hemarthrosis, gout, septic joint, fracture, tendonitis, carpal tunnel syndrome, muscle strain, bursitis, compartment syndrome   Co morbidities that complicate the patient evaluation  DM, anemia, HLD, HTN    Additional history obtained:  Dr. Theotis PCP   Problem List / ED  Course / Critical interventions / Medication management  Patient presents ED concern for left ankle pain after slipping and falling on ice.  Physical exam concerning for mild deformity of left ankle but is otherwise reassuring.  Patient afebrile with stable vitals. I ordered imaging studies including ankle/tibia/fibula x-ray and CT ankle. I independently visualized and interpreted imaging. I agree with the radiologist interpretation of stage IV Weber B fracture and transverse medial malleoli are fracture.  Repeat xray taken to ensure that fracture remained without dislocation. Repeat xray was reassuring.  I requested consultation with the orthopedic provider on-call Ozell Purchase,  and discussed lab and imaging findings as well as pertinent  plan - they recommend: NWB, apply splint, crutches outpatient follow-up with Dr. Germaine. Shared results with patient. Answered all questions. Splint placed. Leg continues to appear NV intact after splint placement. Patient agreeable with plan and is ready for discharge.  Patient educated on alternating Advil  and Tylenol  for pain management and will be prescribed Oxy 5 mg for breakthrough pain. I have reviewed the patients home medicines and have made adjustments as needed The patient has been appropriately medically screened and/or stabilized in the ED. I have low suspicion for any other emergent medical condition which would require further screening, evaluation or treatment in the ED or require inpatient management. At time of discharge the patient is hemodynamically stable and in no acute distress. I have discussed work-up results and diagnosis with patient and answered all questions. Patient is agreeable with discharge plan. We discussed strict return precautions for returning to the emergency department and they verbalized understanding.      Social Determinants of Health:  none      Final diagnoses:  Closed nondisplaced fracture of lateral malleolus  of left fibula, initial encounter  Closed nondisplaced fracture of medial malleolus of left tibia, initial encounter    ED Discharge Orders          Ordered    oxyCODONE  (ROXICODONE ) 5 MG immediate release tablet  Every 6 hours PRN       Note to Pharmacy: Ankle fracture ICD-10 code S82   04/30/24 1324               Hoy Nidia FALCON, [place] 04/30/24 1440  "

## 2024-07-05 ENCOUNTER — Ambulatory Visit: Payer: Self-pay | Admitting: Nurse Practitioner

## 2024-07-06 ENCOUNTER — Encounter: Admitting: Skilled Nursing Facility1
